# Patient Record
Sex: Male | Born: 1965
Health system: Southern US, Community
[De-identification: ages and names within clinical notes are randomized; demographics above are authoritative.]

## PROBLEM LIST (undated history)

## (undated) DIAGNOSIS — I10 Essential (primary) hypertension: Secondary | ICD-10-CM

## (undated) DIAGNOSIS — R7303 Prediabetes: Secondary | ICD-10-CM

## (undated) DIAGNOSIS — R011 Cardiac murmur, unspecified: Secondary | ICD-10-CM

## (undated) DIAGNOSIS — M1990 Unspecified osteoarthritis, unspecified site: Secondary | ICD-10-CM

## (undated) DIAGNOSIS — D649 Anemia, unspecified: Secondary | ICD-10-CM

## (undated) DIAGNOSIS — F1020 Alcohol dependence, uncomplicated: Secondary | ICD-10-CM

## (undated) DIAGNOSIS — T7840XA Allergy, unspecified, initial encounter: Secondary | ICD-10-CM

## (undated) HISTORY — DX: Allergy, unspecified, initial encounter: T78.40XA

## (undated) HISTORY — PX: NO PAST SURGERIES: SHX2092

## (undated) HISTORY — DX: Cardiac murmur, unspecified: R01.1

## (undated) HISTORY — DX: Unspecified osteoarthritis, unspecified site: M19.90

---

## 2004-11-10 ENCOUNTER — Emergency Department (HOSPITAL_COMMUNITY): Admission: EM | Admit: 2004-11-10 | Discharge: 2004-11-10 | Payer: Self-pay | Admitting: Emergency Medicine

## 2013-05-29 ENCOUNTER — Emergency Department (HOSPITAL_COMMUNITY)
Admission: EM | Admit: 2013-05-29 | Discharge: 2013-05-29 | Disposition: A | Discharge status: Home / Self Care | Payer: Worker's Compensation | Attending: Emergency Medicine | Admitting: Emergency Medicine

## 2013-05-29 ENCOUNTER — Encounter (HOSPITAL_COMMUNITY): Payer: Self-pay | Admitting: Emergency Medicine

## 2013-05-29 DIAGNOSIS — Y9389 Activity, other specified: Secondary | ICD-10-CM | POA: Insufficient documentation

## 2013-05-29 DIAGNOSIS — S8990XA Unspecified injury of unspecified lower leg, initial encounter: Secondary | ICD-10-CM | POA: Insufficient documentation

## 2013-05-29 DIAGNOSIS — M79652 Pain in left thigh: Secondary | ICD-10-CM

## 2013-05-29 DIAGNOSIS — Z79899 Other long term (current) drug therapy: Secondary | ICD-10-CM | POA: Insufficient documentation

## 2013-05-29 DIAGNOSIS — I10 Essential (primary) hypertension: Secondary | ICD-10-CM | POA: Insufficient documentation

## 2013-05-29 DIAGNOSIS — Y92410 Unspecified street and highway as the place of occurrence of the external cause: Secondary | ICD-10-CM | POA: Insufficient documentation

## 2013-05-29 DIAGNOSIS — F17200 Nicotine dependence, unspecified, uncomplicated: Secondary | ICD-10-CM | POA: Insufficient documentation

## 2013-05-29 HISTORY — DX: Essential (primary) hypertension: I10

## 2013-05-29 MED ORDER — HYDROCODONE-ACETAMINOPHEN 5-325 MG PO TABS
1.0000 | ORAL_TABLET | Freq: Once | ORAL | Status: AC
  Administered 2013-05-29: 1 via ORAL
  Filled 2013-05-29: qty 1

## 2013-05-29 MED ORDER — IBUPROFEN 200 MG PO TABS
400.0000 mg | ORAL_TABLET | Freq: Once | ORAL | Status: AC
  Administered 2013-05-29: 400 mg via ORAL
  Filled 2013-05-29: qty 2

## 2013-05-29 MED ORDER — HYDROCODONE-ACETAMINOPHEN 5-325 MG PO TABS: ORAL_TABLET | ORAL | Status: DC
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
## 2013-05-29 NOTE — ED Provider Notes (Signed)
Medical screening examination/treatment/procedure(s) were performed by non-physician practitioner and as supervising physician I was immediately available for consultation/collaboration.   Jaymin B. Bernette Mayers, MD 05/29/13 1112

## 2013-05-29 NOTE — ED Notes (Signed)
Patient was educated not to drive, operate heavy machinery, or drink alcohol while taking narcotic medication.  
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
## 2016-01-30 ENCOUNTER — Encounter (HOSPITAL_COMMUNITY): Payer: Self-pay | Admitting: Emergency Medicine

## 2016-01-30 ENCOUNTER — Emergency Department (HOSPITAL_COMMUNITY): Payer: Self-pay

## 2016-01-30 ENCOUNTER — Emergency Department (HOSPITAL_COMMUNITY)
Admission: EM | Admit: 2016-01-30 | Discharge: 2016-01-30 | Disposition: A | Discharge status: Home / Self Care | Payer: Self-pay | Attending: Emergency Medicine | Admitting: Emergency Medicine

## 2016-01-30 DIAGNOSIS — R079 Chest pain, unspecified: Secondary | ICD-10-CM | POA: Insufficient documentation

## 2016-01-30 DIAGNOSIS — Z79899 Other long term (current) drug therapy: Secondary | ICD-10-CM | POA: Insufficient documentation

## 2016-01-30 DIAGNOSIS — M79606 Pain in leg, unspecified: Secondary | ICD-10-CM | POA: Insufficient documentation

## 2016-01-30 DIAGNOSIS — I10 Essential (primary) hypertension: Secondary | ICD-10-CM | POA: Insufficient documentation

## 2016-01-30 DIAGNOSIS — F17210 Nicotine dependence, cigarettes, uncomplicated: Secondary | ICD-10-CM | POA: Insufficient documentation

## 2016-01-30 LAB — CBC
HCT: 41.2 % (ref 39.0–52.0)
HEMOGLOBIN: 14.2 g/dL (ref 13.0–17.0)
MCH: 29.7 pg (ref 26.0–34.0)
MCHC: 34.5 g/dL (ref 30.0–36.0)
MCV: 86.2 fL (ref 78.0–100.0)
Platelets: 185 10*3/uL (ref 150–400)
RBC: 4.78 MIL/uL (ref 4.22–5.81)
RDW: 14.3 % (ref 11.5–15.5)
WBC: 7.5 10*3/uL (ref 4.0–10.5)

## 2016-01-30 LAB — BASIC METABOLIC PANEL
ANION GAP: 11 (ref 5–15)
BUN: 16 mg/dL (ref 6–20)
CALCIUM: 9.4 mg/dL (ref 8.9–10.3)
CHLORIDE: 106 mmol/L (ref 101–111)
CO2: 26 mmol/L (ref 22–32)
Creatinine, Ser: 0.81 mg/dL (ref 0.61–1.24)
GFR calc Af Amer: >60 mL/min (ref >60)
GFR calc non Af Amer: >60 mL/min (ref >60)
Glucose, Bld: 96 mg/dL (ref 65–99)
Potassium: 4.3 mmol/L (ref 3.5–5.1)
Sodium: 143 mmol/L (ref 135–145)

## 2016-01-30 LAB — I-STAT TROPONIN, ED
TROPONIN I, POC: 0.01 ng/mL (ref 0.00–0.08)
Troponin i, poc: 0.01 ng/mL (ref 0.00–0.08)

## 2016-01-30 MED ORDER — HYDROCHLOROTHIAZIDE 12.5 MG PO CAPS
25.0000 mg | ORAL_CAPSULE | Freq: Once | ORAL | Status: AC
  Administered 2016-01-30: 25 mg via ORAL
  Filled 2016-01-30: qty 2

## 2016-01-30 MED ORDER — HYDROCHLOROTHIAZIDE 25 MG PO TABS: 25.0000 mg | ORAL_TABLET | Freq: Every day | ORAL | Status: DC

## 2016-01-30 NOTE — ED Notes (Signed)
Per pt, states right/left chest pain since last night-not constant-states occurred a couple of times

## 2016-01-30 NOTE — ED Notes (Signed)
Explained to pt he needs to be in bed and on the cardiac monitor, her declined and is sitting in the chair, sts "I don't want to jinx anything, if I am attached to it".

## 2016-01-30 NOTE — ED Notes (Signed)
Pt reports right side/chest pain x 3 days, he sts around same time he noticed pain in his right lower leg, radiating to hip. Right lower leg is somewhat swollen, red and warm to touch.

## 2016-01-30 NOTE — ED Notes (Signed)
Pt. Refuses to be put back on cardiac monitor.

## 2016-01-30 NOTE — Discharge Instructions (Signed)
Follow up with the heart md in 1-2 weeks

## 2016-01-31 NOTE — ED Provider Notes (Addendum)
CSN: [id number]     Arrival date & time 01/30/16  1131 History   First MD Initiated Contact with Patient 01/30/16 1310     Chief Complaint  Patient presents with  . Chest Pain  . Leg Pain     (Consider location/radiation/quality/duration/timing/severity/associated sxs/prior Treatment) Patient is a 50 y.o. male presenting with chest pain and leg pain. The history is provided by the patient (Patient complains of right-sided chest pain for a few seconds this has occurred a few times. He used to be on blood pressure medicine but has not been taking it for quite some time).  Chest Pain Pain location:  L chest Pain quality: aching   Pain radiates to:  Does not radiate Pain radiates to the back: no   Pain severity:  Moderate Onset quality:  Sudden Timing:  Intermittent Progression:  Resolved Chronicity:  New Associated symptoms: no abdominal pain, no back pain, no cough, no fatigue and no headache   Leg Pain Associated symptoms: no back pain and no fatigue     Past Medical History  Diagnosis Date  . Hypertension    History reviewed. No pertinent past surgical history. No family history on file. Social History  Substance Use Topics  . Smoking status: Current Every Day Smoker -- 0.50 packs/day    Types: Cigarettes  . Smokeless tobacco: None  . Alcohol Use: Yes     Comment: occasional    Review of Systems  Constitutional: Negative for appetite change and fatigue.  HENT: Negative for congestion, ear discharge and sinus pressure.   Eyes: Negative for discharge.  Respiratory: Negative for cough.   Cardiovascular: Positive for chest pain.  Gastrointestinal: Negative for abdominal pain and diarrhea.  Genitourinary: Negative for frequency and hematuria.  Musculoskeletal: Negative for back pain.  Skin: Negative for rash.  Neurological: Negative for seizures and headaches.  Psychiatric/Behavioral: Negative for hallucinations.      Allergies  Review of patient's allergies  indicates no known allergies.  Home Medications   Prior to Admission medications   Medication Sig Start Date End Date Taking? Authorizing Provider  ibuprofen (ADVIL,MOTRIN) 200 MG tablet Take 400 mg by mouth every 6 (six) hours as needed for fever, headache, mild pain, moderate pain or cramping.   Yes Historical Provider, MD  Multiple Vitamin (MULTIVITAMIN WITH MINERALS) TABS tablet Take 1 tablet by mouth daily.   Yes Historical Provider, MD  hydrochlorothiazide (HYDRODIURIL) 25 MG tablet Take 1 tablet (25 mg total) by mouth daily. 01/30/16   Bethann Berkshire, MD   BP 161/105 mmHg  Pulse 74  Temp(Src) 97.9 F (36.6 C) (Oral)  Resp 14  Ht  (1.88 m)  Wt 202 lb (91.627 kg)  BMI 25.92 kg/m2  SpO2 100% Physical Exam  Constitutional: He is oriented to person, place, and time. He appears well-developed.  HENT:  Head: Normocephalic.  Eyes: Conjunctivae and EOM are normal. No scleral icterus.  Neck: Neck supple. No thyromegaly present.  Cardiovascular: Normal rate and regular rhythm.  Exam reveals no gallop and no friction rub.   No murmur heard. Pulmonary/Chest: No stridor. He has no wheezes. He has no rales. He exhibits no tenderness.  Abdominal: He exhibits no distension. There is no tenderness. There is no rebound.  Musculoskeletal: Normal range of motion. He exhibits no edema.  Lymphadenopathy:    He has no cervical adenopathy.  Neurological: He is oriented to person, place, and time. He exhibits normal muscle tone. Coordination normal.  Skin: No rash noted. No erythema.  Psychiatric: He has a normal mood and affect. His behavior is normal.    ED Course  Procedures (including critical care time) Labs Review Labs Reviewed  BASIC METABOLIC PANEL  CBC  I-STAT TROPOININ, ED  Rosezena SensorI-STAT TROPOININ, ED  Rosezena SensorI-STAT TROPOININ, ED    Imaging Review Dg Chest 2 View  01/30/2016  CLINICAL DATA:  Right chest pain for the past 2-3 days.  Smoker. EXAM: CHEST  2 VIEW COMPARISON:  None.  FINDINGS: Normal sized heart. Clear lungs. Flattening of the hemidiaphragms. Minimal scoliosis. IMPRESSION: No acute abnormality.  Mild changes of COPD. Electronically Signed   By: Beckie SaltsSteven  Reid M.D.   On: 01/30/2016 12:39   I have personally reviewed and evaluated these images and lab results as part of my medical decision-making.   EKG Interpretation   Date/Time:  Saturday January 30 2016 11:38:59 EDT Ventricular Rate:  98 PR Interval:  153 QRS Duration: 107 QT Interval:  335 QTC Calculation: 428 R Axis:   46 Text Interpretation:  Sinus rhythm Biatrial enlargement Probable  anteroseptal infarct, old Abnrm T, consider ischemia, anterolateral lds No  old tracing to compare Confirmed by [facility]  MD, MARTHA [phone]([id number]) on  01/30/2016 12:07:43 PM Also confirmed by Jeremih Dearmas  MD, Jomarie LongsJOSEPH [phone]([id number]  on  01/30/2016 1:12:07 PM      MDM   Final diagnoses:  Chest pain at rest   Patient with chest pain and 2 normal troponins. EKG shows inverted T waves.  Patient is put on blood pressure medicine and had been referred to cardiology    Bethann BerkshireJoseph Braileigh Landenberger, MD 01/31/16 [phone]  Bethann BerkshireJoseph Terree Gaultney, MD 04/25/16 2129

## 2016-02-04 ENCOUNTER — Encounter (HOSPITAL_COMMUNITY): Payer: Self-pay | Admitting: Emergency Medicine

## 2016-02-04 ENCOUNTER — Emergency Department (HOSPITAL_COMMUNITY)
Admission: EM | Admit: 2016-02-04 | Discharge: 2016-02-04 | Disposition: A | Discharge status: Home / Self Care | Payer: Self-pay | Attending: Emergency Medicine | Admitting: Emergency Medicine

## 2016-02-04 ENCOUNTER — Ambulatory Visit (HOSPITAL_BASED_OUTPATIENT_CLINIC_OR_DEPARTMENT_OTHER): Payer: Self-pay

## 2016-02-04 DIAGNOSIS — F17210 Nicotine dependence, cigarettes, uncomplicated: Secondary | ICD-10-CM | POA: Insufficient documentation

## 2016-02-04 DIAGNOSIS — M7989 Other specified soft tissue disorders: Secondary | ICD-10-CM

## 2016-02-04 DIAGNOSIS — M79609 Pain in unspecified limb: Secondary | ICD-10-CM

## 2016-02-04 DIAGNOSIS — I10 Essential (primary) hypertension: Secondary | ICD-10-CM | POA: Insufficient documentation

## 2016-02-04 DIAGNOSIS — L03115 Cellulitis of right lower limb: Secondary | ICD-10-CM | POA: Insufficient documentation

## 2016-02-04 DIAGNOSIS — Z79899 Other long term (current) drug therapy: Secondary | ICD-10-CM | POA: Insufficient documentation

## 2016-02-04 LAB — D-DIMER, QUANTITATIVE (NOT AT ARMC): D DIMER QUANT: 0.85 ug{FEU}/mL — AB (ref 0.00–0.50)

## 2016-02-04 MED ORDER — SULFAMETHOXAZOLE-TRIMETHOPRIM 800-160 MG PO TABS: 1.0000 | ORAL_TABLET | Freq: Two times a day (BID) | ORAL | Status: AC

## 2016-02-04 MED ORDER — SULFAMETHOXAZOLE-TRIMETHOPRIM 800-160 MG PO TABS
1.0000 | ORAL_TABLET | Freq: Once | ORAL | Status: AC
  Administered 2016-02-04: 1 via ORAL
  Filled 2016-02-04: qty 1

## 2016-02-04 NOTE — ED Notes (Signed)
Patient states R leg swelling and pain.  Patient states it has been swollen and hurting x 1 week.  Patient states was seen for the same last week "but they put me on bp medication".   Patient denies other problems at this time.

## 2016-02-04 NOTE — Discharge Instructions (Signed)
Cellulitis °Cellulitis is an infection of the skin and the tissue under the skin. The infected area is usually red and tender. This happens most often in the arms and lower legs. °HOME CARE  °· Take your antibiotic medicine as told. Finish the medicine even if you start to feel better. °· Keep the infected arm or leg raised (elevated). °· Put a warm cloth on the area up to 4 times per day. °· Only take medicines as told by your doctor. °· Keep all doctor visits as told. °GET HELP IF: °· You see red streaks on the skin coming from the infected area. °· Your red area gets bigger or turns a dark color. °· Your bone or joint under the infected area is painful after the skin heals. °· Your infection comes back in the same area or different area. °· You have a puffy (swollen) bump in the infected area. °· You have new symptoms. °· You have a fever. °GET HELP RIGHT AWAY IF:  °· You feel very sleepy. °· You throw up (vomit) or have watery poop (diarrhea). °· You feel sick and have muscle aches and pains. °  °This information is not intended to replace advice given to you by your health care provider. Make sure you discuss any questions you have with your health care provider. °  °Document Released: 03/21/2008 Document Revised: 06/24/2015 Document Reviewed: 12/19/2011 °Elsevier Interactive Patient Education ©2016 [employer] ° °

## 2016-02-04 NOTE — ED Provider Notes (Signed)
CSN: [id number]     Arrival date & time 02/04/16  0821 History   First MD Initiated Contact with Patient 02/04/16 [phone]     Chief Complaint  Patient presents with  . leg swellling    Patient gave verbal permission to utilize photo for medical documentation only The image was not stored on any personal device  Patient is a 50 y.o. male presenting with leg pain. The history is provided by the patient.  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 Hypertension    History reviewed. No pertinent past surgical history. No family history on file. Social History  Substance Use Topics  . Smoking status: Current Every Day Smoker -- 0.50 packs/day    Types: Cigarettes  . Smokeless tobacco: None  . Alcohol Use: Yes     Comment: occasional    Review of Systems  Constitutional: Negative for fever.  Cardiovascular: Negative for chest pain.  Gastrointestinal: Negative for vomiting and blood in stool.  Skin: Positive for color change.  All other systems reviewed and are negative.     Allergies  Review of patient's allergies indicates no known allergies.  Home Medications   Prior to Admission medications   Medication Sig Start Date End Date Taking? Authorizing Provider  hydrochlorothiazide (HYDRODIURIL) 25 MG tablet Take 1 tablet (25 mg total) by mouth daily. 01/30/16  Yes Bethann Berkshire, MD  ibuprofen (ADVIL,MOTRIN) 200 MG tablet Take 400 mg by mouth every 6 (six) hours as needed for fever, headache, mild pain, moderate pain or cramping.    Historical Provider, MD  Multiple Vitamin (MULTIVITAMIN WITH MINERALS) TABS tablet Take 1 tablet by mouth daily.    Historical Provider, MD   BP 163/88 mmHg  Pulse 98  Temp(Src) 98.3 F (36.8 C) (Oral)  Resp 20  Ht  (1.88 m)  Wt 93.895 kg  BMI 26.57 kg/m2  SpO2 96% Physical Exam CONSTITUTIONAL: Well developed/well nourished  HEAD: Normocephalic/atraumatic EYES: EOMI ENMT: Mucous membranes moist NECK: supple no meningeal signs SPINE/BACK:entire spine nontender CV: S1/S2 noted, no murmurs/rubs/gallops noted LUNGS: Lungs are clear to auscultation bilaterally, no apparent distress ABDOMEN: soft, nontender, no rebound or guarding, bowel sounds noted throughout abdomen GU:no cva tenderness NEURO: Pt is awake/alert/appropriate, moves all extremitiesx4.  No facial droop.   EXTREMITIES: pulses normal/equal, full ROM, no crepitus, minimal edema to right LE, no calf tenderness, see photo, he has erythema and tenderness but no abscess noted SKIN: warm, color normal PSYCH: no abnormalities of mood noted, alert and oriented to situation     ED Course  Procedures  Pt with elevated d-dimer Will give DVT study If negative, likely cellulitis without abscess 12:08 PM Negative DVT study Will tx for early cellulitis Return precautions discussed Stable for d/c home  Labs  Review Labs Reviewed  D-DIMER, QUANTITATIVE (NOT AT [facility]) - Abnormal; Notable for the following:    D-Dimer, Quant 0.85 (*)    All other components within normal limits   I have personally reviewed and evaluated these  lab results as part of my medical decision-making.   Medications  sulfamethoxazole-trimethoprim (BACTRIM DS,SEPTRA DS) 800-160 MG per tablet 1 tablet (1 tablet Oral Given 02/04/16 0951)    MDM   Final diagnoses:  Cellulitis of right  lower extremity   Nursing notes including past medical history and social history reviewed and considered in documentation Labs/vital reviewed myself and considered during evaluation     Zadie Rhineonald Zaida Reiland, MD 02/04/16 1208

## 2016-02-04 NOTE — Progress Notes (Signed)
Preliminary results by tech - Left Lower Ext. Venous Duplex Completed. Negative for deep and superficial vein thrombosis.  Sophea Rackham, BS, RDMS, RVT  

## 2016-05-27 ENCOUNTER — Encounter (HOSPITAL_COMMUNITY): Payer: Self-pay | Admitting: Emergency Medicine

## 2016-05-27 ENCOUNTER — Emergency Department (HOSPITAL_COMMUNITY)
Admission: EM | Admit: 2016-05-27 | Discharge: 2016-05-27 | Disposition: A | Discharge status: Home / Self Care | Payer: Self-pay | Attending: Emergency Medicine | Admitting: Emergency Medicine

## 2016-05-27 DIAGNOSIS — F17210 Nicotine dependence, cigarettes, uncomplicated: Secondary | ICD-10-CM | POA: Insufficient documentation

## 2016-05-27 DIAGNOSIS — R55 Syncope and collapse: Secondary | ICD-10-CM | POA: Insufficient documentation

## 2016-05-27 DIAGNOSIS — H538 Other visual disturbances: Secondary | ICD-10-CM | POA: Insufficient documentation

## 2016-05-27 DIAGNOSIS — I10 Essential (primary) hypertension: Secondary | ICD-10-CM | POA: Insufficient documentation

## 2016-05-27 DIAGNOSIS — E860 Dehydration: Secondary | ICD-10-CM | POA: Insufficient documentation

## 2016-05-27 LAB — CBG MONITORING, ED: Glucose-Capillary: 82 mg/dL (ref 65–99)

## 2016-05-27 LAB — BASIC METABOLIC PANEL
Anion gap: 13 (ref 5–15)
BUN: 9 mg/dL (ref 6–20)
CALCIUM: 8.7 mg/dL — AB (ref 8.9–10.3)
CHLORIDE: 106 mmol/L (ref 101–111)
CO2: 21 mmol/L — ABNORMAL LOW (ref 22–32)
CREATININE: 0.78 mg/dL (ref 0.61–1.24)
Glucose, Bld: 79 mg/dL (ref 65–99)
Potassium: 4 mmol/L (ref 3.5–5.1)
SODIUM: 140 mmol/L (ref 135–145)

## 2016-05-27 LAB — CBC
HCT: 43.1 % (ref 39.0–52.0)
Hemoglobin: 14.5 g/dL (ref 13.0–17.0)
MCH: 30.6 pg (ref 26.0–34.0)
MCHC: 33.6 g/dL (ref 30.0–36.0)
MCV: 90.9 fL (ref 78.0–100.0)
PLATELETS: 194 10*3/uL (ref 150–400)
RBC: 4.74 MIL/uL (ref 4.22–5.81)
RDW: 13.3 % (ref 11.5–15.5)
WBC: 5.2 10*3/uL (ref 4.0–10.5)

## 2016-05-27 LAB — I-STAT TROPONIN, ED: Troponin i, poc: 0.01 ng/mL (ref 0.00–0.08)

## 2016-05-27 MED ORDER — HYDROCHLOROTHIAZIDE 25 MG PO TABS: 25.0000 mg | ORAL_TABLET | Freq: Every day | ORAL | 1 refills | Status: DC

## 2016-05-27 MED ORDER — SODIUM CHLORIDE 0.9 % IV BOLUS (SEPSIS)
1000.0000 mL | Freq: Once | INTRAVENOUS | Status: AC
  Administered 2016-05-27: 1000 mL via INTRAVENOUS

## 2016-05-27 NOTE — ED Notes (Signed)
Dr. Orpah ClintonIssac aware of pt's BP.

## 2016-05-27 NOTE — ED Notes (Signed)
While ambulating pt in the hallway, pt appeared steady on his feet. Pt asked multiple times how he felt and stated that he "felt fine". Informed Cassie - RN.

## 2016-05-27 NOTE — ED Provider Notes (Signed)
[facility]-EMERGENCY DEPT Provider Note   CSN: [id number] Arrival date & time: 05/27/16  1103  First Provider Contact:  First MD Initiated Contact with Patient 05/27/16 1145        History   Chief Complaint Chief Complaint  Patient presents with  . Dizziness  . Blurred Vision  . Nausea    HPI Cameron Rogers is a 50 y.o. male.  HPI 50 year old male with past mental history of poorly controlled hypertension who presents with mild dizziness, blurred vision and near syncope while working outside today. The patient states that he has been under significantly increased stress over the last several days. He has not been sleeping well. He woke up late this morning and did not eat breakfast. 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 While working. He began to develop mild lightheadedness and dizziness. He describes transient bilateral blurring of his vision and a sensation that he was going to pass out. His symptoms and resolved when he rested, but recurred after he began working again. Denies any social chest pain or palpitations. He presented to his work advising advised to present to the ED. Of note, he has not been taking his blood pressure medications as he ran out of them. Denies any headache. Denies any vision changes  Past Medical History:  Diagnosis Date  . Hypertension     There are no active problems to display for this patient.   History reviewed. No pertinent surgical history.     Home Medications    Prior to Admission medications   Medication Sig Start Date End Date Taking? Authorizing Provider  hydrochlorothiazide (HYDRODIURIL) 25 MG tablet Take 1 tablet (25 mg total) by mouth daily. 05/27/16   Shaune Pollack, MD    Family History No family history on file.  Social History Social History  Substance Use Topics  . Smoking status: Current Every Day Smoker    Packs/day: 0.50    Types: Cigarettes  . Smokeless tobacco: Never Used  . Alcohol use Yes   Comment: occasional     Allergies   Review of patient's allergies indicates no known allergies.   Review of Systems Review of Systems  Constitutional: Positive for fatigue. Negative for chills and fever.  HENT: Negative for congestion and rhinorrhea.   Eyes: Negative for visual disturbance.  Respiratory: Negative for cough, shortness of breath and wheezing.   Cardiovascular: Negative for chest pain and leg swelling.  Gastrointestinal: Negative for abdominal pain, diarrhea, nausea and vomiting.  Genitourinary: Negative for dysuria and flank pain.  Musculoskeletal: Negative for neck pain and neck stiffness.  Skin: Negative for rash and wound.  Allergic/Immunologic: Negative for immunocompromised state.  Neurological: Positive for dizziness and light-headedness. Negative for syncope, weakness and headaches.     Physical Exam Updated Vital Signs BP (!) 158/110 (BP Location: Right Arm)   Pulse 78   Temp 97.9 F (36.6 C) (Oral)   Resp 18   Ht 6\' 2"  (1.88 m)   Wt 185 lb (83.9 kg)   SpO2 98%   BMI 23.75 kg/m   Physical Exam  Constitutional: He is oriented to person, place, and time. He appears well-developed and well-nourished. No distress.  HENT:  Head: Normocephalic and atraumatic.  Dry mucous membranes  Eyes: Conjunctivae are normal. Pupils are equal, round, and reactive to light.  Neck: Neck supple.  Cardiovascular: Normal rate, regular rhythm and normal heart sounds.  Exam reveals no friction rub.   No murmur heard. Pulmonary/Chest: Effort normal  and breath sounds normal. No respiratory distress. He has no wheezes. He has no rales.  Abdominal: He exhibits no distension. There is no tenderness.  Musculoskeletal: He exhibits no edema.  Neurological: He is alert and oriented to person, place, and time. He has normal strength. He displays normal reflexes. No cranial nerve deficit or sensory deficit. He exhibits normal muscle tone. Coordination and gait normal. GCS eye  subscore is 4. GCS verbal subscore is 5. GCS motor subscore is 6.  Skin: Skin is warm. Capillary refill takes 2 to 3 seconds.  Nursing note and vitals reviewed.    ED Treatments / Results  Labs (all labs ordered are listed, but only abnormal results are displayed) Labs Reviewed  BASIC METABOLIC PANEL - Abnormal; Notable for the following:       Result Value   CO2 21 (*)    Calcium 8.7 (*)    All other components within normal limits  CBC  CBG MONITORING, ED  I-STAT TROPOININ, ED    EKG  EKG Interpretation  Date/Time:  Friday May 27 2016 11:15:15 EDT Ventricular Rate:  81 PR Interval:    QRS Duration: 110 QT Interval:  392 QTC Calculation: 455 R Axis:   36 Text Interpretation:  Sinus rhythm Left atrial enlargement Abnormal T, consider ischemia, lateral leads When compared to 01/30/16 TWI are improved and resolving in V4 No ST depressions compared to prior Confirmed by Annika Selke MD, Sheria LangAMERON [phone] on 05/27/2016 12:42:35 PM Also confirmed by Erma HeritageISAACS MD, Nabor Thomann [phone]), editor Whitney PostLOGAN, Cala BradfordKIMBERLY [phone])  on 05/27/2016 1:10:04 PM       Radiology No results found.  Procedures Procedures (including critical care time)  Medications Ordered in ED Medications  sodium chloride 0.9 % bolus 1,000 mL (0 mLs Intravenous Stopped 05/27/16 1303)     Initial Impression / Assessment and Plan / ED Course  I have reviewed the triage vital signs and the nursing notes.  Pertinent labs & imaging results that were available during my care of the patient were reviewed by me and considered in my medical decision making (see chart for details).  Clinical Course    50 yo AAM with PMHx of chronic, uncontrolled HTN who p/w pre-syncopal episode while working in the heat today. Pt admits to being dehydrated. On arrival, VSS and WNL for pt. He is hypertensive but this is baseline for him. No focal neurological deficits. EKG is non-ischemic - he has chronic TWI consistent with likely LVH and repol but  EKG Is improved from prior. Regarding his near syncope event, primary suspicion is combination of dehydration and orthostasis. Pt had very typical prodrome in setting of work in heat, which resolved with rest. Pt also had poor PO intake this AM and did not eat breakfast. This is c/w orthostasis and dehydration. No signs of ACS on EKG, intervals are normal, and based on history I have a low suspicion for ACS or cardiac arrhythmia. As mentioned, he has no focal neuro deficits to suggest CVA, TIA, seizure, or neurogenic etiology. His screening labwork is overall unremarkable with exception of mildly decreased CO2, c/w dehydration. IVF have been given. Pt is ambulatory throughout ED without difficulty and feels better with IVF and reassurance. He remains hypertensive but this is baseline and pt has no HA, vision changes, CP, SOB, or renal injury to suggest HTN urgency or emergency. Will refill his home BP meds, refer to PCP, and advsie fluids at home. Pt in agreement. Return precautions given.   Final Clinical Impressions(s) /  ED Diagnoses   Final diagnoses:  Essential hypertension  Pre-syncope  Dehydration    New Prescriptions Discharge Medication List as of 05/27/2016 12:47 PM       Shaune Pollack, MD 05/28/16 1152

## 2016-05-27 NOTE — ED Notes (Signed)
Pt's CBG 82.  Informed Cassie, RN.

## 2016-05-27 NOTE — ED Triage Notes (Signed)
Received pt from work with c/o 3 episodes of dizziness. Pt reports blurred vision and nausea. Symptoms onset about 0945. Symptoms resolved PTA except for nausea. Pt BP initially was 180/130 for EMS, pt ran out of his BP meds. Pt given 4 mg of zofran. BP decreased to 156/94 while with EMS.

## 2016-06-02 ENCOUNTER — Encounter: Payer: Self-pay | Admitting: Physician Assistant

## 2016-06-02 ENCOUNTER — Ambulatory Visit: Payer: Self-pay | Attending: Internal Medicine | Admitting: Physician Assistant

## 2016-06-02 VITALS — BP 152/92 | HR 96 | Temp 98.5°F | Wt 178.2 lb

## 2016-06-02 DIAGNOSIS — R634 Abnormal weight loss: Secondary | ICD-10-CM | POA: Insufficient documentation

## 2016-06-02 DIAGNOSIS — Z720 Tobacco use: Secondary | ICD-10-CM

## 2016-06-02 DIAGNOSIS — F17200 Nicotine dependence, unspecified, uncomplicated: Secondary | ICD-10-CM

## 2016-06-02 DIAGNOSIS — I10 Essential (primary) hypertension: Secondary | ICD-10-CM | POA: Insufficient documentation

## 2016-06-02 DIAGNOSIS — F17210 Nicotine dependence, cigarettes, uncomplicated: Secondary | ICD-10-CM | POA: Insufficient documentation

## 2016-06-02 LAB — TSH: TSH: 0.77 mIU/L (ref 0.40–4.50)

## 2016-06-02 MED ORDER — HYDROCHLOROTHIAZIDE 25 MG PO TABS: 25.0000 mg | ORAL_TABLET | Freq: Every day | ORAL | 1 refills | Status: DC

## 2016-06-02 NOTE — Patient Instructions (Signed)
Check Blood pressure out of the office 2-3 times/week if possible and record  1-800-quitnow for smoking cessation resources   Hypertension Hypertension, commonly called high blood pressure, is when the force of blood pumping through your arteries is too strong. Your arteries are the blood vessels that carry blood from your heart throughout your body. A blood pressure reading consists of a higher number over a lower number, such as 110/72. The higher number (systolic) is the pressure inside your arteries when your heart pumps. The lower number (diastolic) is the pressure inside your arteries when your heart relaxes. Ideally you want your blood pressure below 120/80. Hypertension forces your heart to work harder to pump blood. Your arteries may become narrow or stiff. Having untreated or uncontrolled hypertension can cause heart attack, stroke, kidney disease, and other problems. RISK FACTORS Some risk factors for high blood pressure are controllable. Others are not.  Risk factors you cannot control include:   Race. You may be at higher risk if you are African American.  Age. Risk increases with age.  Gender. Men are at higher risk than women before age 50 years. After age 50, women are at higher risk than men. Risk factors you can control include:  Not getting enough exercise or physical activity.  Being overweight.  Getting too much fat, sugar, calories, or salt in your diet.  Drinking too much alcohol. SIGNS AND SYMPTOMS Hypertension does not usually cause signs or symptoms. Extremely high blood pressure (hypertensive crisis) may cause headache, anxiety, shortness of breath, and nosebleed. DIAGNOSIS To check if you have hypertension, your health care provider will measure your blood pressure while you are seated, with your arm held at the level of your heart. It should be measured at least twice using the same arm. Certain conditions can cause a difference in blood pressure between your  right and left arms. A blood pressure reading that is higher than normal on one occasion does not mean that you need treatment. If it is not clear whether you have high blood pressure, you may be asked to return on a different day to have your blood pressure checked again. Or, you may be asked to monitor your blood pressure at home for 1 or more weeks. TREATMENT Treating high blood pressure includes making lifestyle changes and possibly taking medicine. Living a healthy lifestyle can help lower high blood pressure. You may need to change some of your habits. Lifestyle changes may include:  Following the DASH diet. This diet is high in fruits, vegetables, and whole grains. It is low in salt, red meat, and added sugars.  Keep your sodium intake below 2,300 mg per day.  Getting at least 30-45 minutes of aerobic exercise at least 4 times per week.  Losing weight if necessary.  Not smoking.  Limiting alcoholic beverages.  Learning ways to reduce stress. Your health care provider may prescribe medicine if lifestyle changes are not enough to get your blood pressure under control, and if one of the following is true:  You are 6318-859 years of age and your systolic blood pressure is above 140.  You are 50 years of age or older, and your systolic blood pressure is above 150.  Your diastolic blood pressure is above 90.  You have diabetes, and your systolic blood pressure is over 140 or your diastolic blood pressure is over 90.  You have kidney disease and your blood pressure is above 140/90.  You have heart disease and your blood pressure is above  140/90. Your personal target blood pressure may vary depending on your medical conditions, your age, and other factors. HOME CARE INSTRUCTIONS  Have your blood pressure rechecked as directed by your health care provider.   Take medicines only as directed by your health care provider. Follow the directions carefully. Blood pressure medicines must be  taken as prescribed. The medicine does not work as well when you skip doses. Skipping doses also puts you at risk for problems.  Do not smoke.   Monitor your blood pressure at home as directed by your health care provider. SEEK MEDICAL CARE IF:   You think you are having a reaction to medicines taken.  You have recurrent headaches or feel dizzy.  You have swelling in your ankles.  You have trouble with your vision. SEEK IMMEDIATE MEDICAL CARE IF:  You develop a severe headache or confusion.  You have unusual weakness, numbness, or feel faint.  You have severe chest or abdominal pain.  You vomit repeatedly.  You have trouble breathing. MAKE SURE YOU:   Understand these instructions.  Will watch your condition.  Will get help right away if you are not doing well or get worse.   This information is not intended to replace advice given to you by your health care provider. Make sure you discuss any questions you have with your health care provider.   Document Released: 10/03/2005 Document Revised: 02/17/2015 Document Reviewed: 07/26/2013 Elsevier Interactive Patient Education [employer]2016 Elsevier Inc.

## 2016-06-02 NOTE — Progress Notes (Signed)
Pt states he is here for the following:   Emergency Room following up for hypertension Medication refill  Pt states he feels like blood pressure medication is not helping Pt would like to discuss smoking cessation Pt would like to make sure he is not diabetic

## 2016-06-02 NOTE — Progress Notes (Signed)
Patient ID: Cameron Rogers, male   DOB: 05/24/1966, 50 y.o.   MRN: [id number]   Cameron Rogers, is a 50 y.o. male  NWG:[id number]  [id number]  DOB - 04/04/1966  Subjective:  Chief Complaint and HPI: Cameron Rogers is a 50 y.o. male here today to establish care and for a follow up visit after being seen in the ED for dizziness and dehydration.  Cardiac work-up was negative. He has had intermittently treated htn without consistent meds or follow-up for the last few years.  He denies CP.  He has had no further dizziness or weakness since being seen in the ED.  Overall, he feels good.  He does notice that he has lost weight over the last few months.  Upon reviewing his chart, I notice that he has lost 29 pounds from 01/2016 until now.  He says his appetite has not changed.  He denies dysphagia.  No melena or hematochezia.  No abdominal pain or cough.  He has a 36 pack year history of smoking.   He was restarted on HCTZ in the ED.   ED notes, EKG, and labs reviewed.     ROS:   Constitutional:  No f/c, No night sweats, +weight loss. EENT:  No vision changes, No blurry vision, No hearing changes. No mouth, throat, or ear problems.  Respiratory: No cough, No SOB Cardiac: No CP, no palpitations GI:  No abd pain, No N/V/D. GU: No Urinary s/sx Musculoskeletal: No joint pain Neuro: No headache, no dizziness, no motor weakness.  Skin: No rash Endocrine:  No polydipsia. No polyuria.  Psych: Denies SI/HI  No problems updated.  ALLERGIES: No Known Allergies  PAST MEDICAL HISTORY: Past Medical History:  Diagnosis Date  . Hypertension     MEDICATIONS AT HOME: Prior to Admission medications   Medication Sig Start Date End Date Taking? Authorizing Provider  hydrochlorothiazide (HYDRODIURIL) 25 MG tablet Take 1 tablet (25 mg total) by mouth daily. 06/02/16  Yes Anders SimmondsAngela M McClung, PA-C     Objective:  EXAM:   Vitals:   06/02/16 1420  BP: (!) 152/92  Pulse: 96  Temp: 98.5 F (36.9 C)    TempSrc: Oral  Weight: 178 lb 3.2 oz (80.8 kg)    General appearance : A&OX3. NAD. Non-toxic-appearing HEENT: Atraumatic and Normocephalic.  PERRLA. EOM intact.  TM clear B. Mouth-MMM, post pharynx WNL w/o erythema, No PND. Neck: supple, no JVD. No cervical lymphadenopathy. No thyromegaly Chest/Lungs:  Breathing-non-labored, Good air entry bilaterally, breath sounds normal without rales, rhonchi, or wheezing  CVS: S1 S2 regular, no murmurs, gallops, rubs  Abdomen: Bowel sounds present, Non tender and not distended with no gaurding, rigidity or rebound. Extremities: Bilateral Lower Ext shows no edema, both legs are warm to touch with = pulse throughout Neurology:  CN II-XII grossly intact, Non focal.   Psych:  TP linear. J/I WNL. Normal speech. Appropriate eye contact and affect.  Skin:  No Rash  Data Review No results found for: HGBA1C  Normal glucose on BMP   Assessment & Plan   1. Essential hypertension - hydrochlorothiazide (HYDRODIURIL) 25 MG tablet; Take 1 tablet (25 mg total) by mouth daily.  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  He is going to check and see if there are inexpensive home meters he might be able to purchase. If possible, he will check his BP 2-3X/week and record it bt now and  his next visit.   Low sodium diet recommended.   2. Loss of weight Normal CBC and BMP - TSH Consider further work/up if warranted.   3. Smoker Gave smoking cessation resources.   Patient have been counseled extensively about nutrition and exercise  Return in about 3 weeks (around 06/23/2016) for establish with PCP.  The patient was given clear instructions to go to ER or return to medical center if symptoms don't improve, worsen or new problems develop. The patient verbalized understanding. The patient was told to call to get lab results if they haven't heard anything in the next week.     Georgian CoAngela McClung,  PA-C [facility] [place], [place] [phone]   06/02/2016, 4:04 PM

## 2016-06-08 ENCOUNTER — Telehealth: Payer: Self-pay

## 2016-06-08 NOTE — Telephone Encounter (Signed)
Contacted pt to go over lab results lvm for pt to give me a call at his earliest convenience

## 2016-06-09 ENCOUNTER — Telehealth: Payer: Self-pay

## 2016-06-09 NOTE — Telephone Encounter (Signed)
Contacted pt to go over lab results pt did not answer lvm for pt to give me a call back at his earliest conveience I will also be sending letter out today

## 2017-04-25 ENCOUNTER — Encounter (HOSPITAL_COMMUNITY): Payer: Self-pay

## 2017-04-25 ENCOUNTER — Emergency Department (HOSPITAL_COMMUNITY)
Admission: EM | Admit: 2017-04-25 | Discharge: 2017-04-25 | Disposition: A | Discharge status: Home / Self Care | Payer: Self-pay | Attending: Emergency Medicine | Admitting: Emergency Medicine

## 2017-04-25 DIAGNOSIS — F17210 Nicotine dependence, cigarettes, uncomplicated: Secondary | ICD-10-CM | POA: Insufficient documentation

## 2017-04-25 DIAGNOSIS — R109 Unspecified abdominal pain: Secondary | ICD-10-CM | POA: Insufficient documentation

## 2017-04-25 DIAGNOSIS — I10 Essential (primary) hypertension: Secondary | ICD-10-CM | POA: Insufficient documentation

## 2017-04-25 LAB — URINALYSIS, ROUTINE W REFLEX MICROSCOPIC
Bilirubin Urine: NEGATIVE
GLUCOSE, UA: NEGATIVE mg/dL
Ketones, ur: NEGATIVE mg/dL
Leukocytes, UA: NEGATIVE
Nitrite: NEGATIVE
PH: 5 (ref 5.0–8.0)
Protein, ur: NEGATIVE mg/dL
SPECIFIC GRAVITY, URINE: 1.006 (ref 1.005–1.030)

## 2017-04-25 LAB — COMPREHENSIVE METABOLIC PANEL
ALK PHOS: 68 U/L (ref 38–126)
ALT: 47 U/L (ref 17–63)
ANION GAP: 10 (ref 5–15)
AST: 100 U/L — ABNORMAL HIGH (ref 15–41)
Albumin: 4.2 g/dL (ref 3.5–5.0)
BILIRUBIN TOTAL: 0.9 mg/dL (ref 0.3–1.2)
BUN: 10 mg/dL (ref 6–20)
CALCIUM: 9.3 mg/dL (ref 8.9–10.3)
CO2: 26 mmol/L (ref 22–32)
Chloride: 100 mmol/L — ABNORMAL LOW (ref 101–111)
Creatinine, Ser: 0.82 mg/dL (ref 0.61–1.24)
GFR calc Af Amer: >60 mL/min (ref >60)
GLUCOSE: 83 mg/dL (ref 65–99)
Potassium: 4 mmol/L (ref 3.5–5.1)
Sodium: 136 mmol/L (ref 135–145)
TOTAL PROTEIN: 7.4 g/dL (ref 6.5–8.1)

## 2017-04-25 LAB — CBG MONITORING, ED: Glucose-Capillary: 78 mg/dL (ref 65–99)

## 2017-04-25 LAB — CBC
HCT: 42.5 % (ref 39.0–52.0)
Hemoglobin: 15.1 g/dL (ref 13.0–17.0)
MCH: 31.3 pg (ref 26.0–34.0)
MCHC: 35.5 g/dL (ref 30.0–36.0)
MCV: 88.2 fL (ref 78.0–100.0)
Platelets: 180 10*3/uL (ref 150–400)
RBC: 4.82 MIL/uL (ref 4.22–5.81)
RDW: 13.9 % (ref 11.5–15.5)
WBC: 7.1 10*3/uL (ref 4.0–10.5)

## 2017-04-25 LAB — LIPASE, BLOOD: Lipase: 21 U/L (ref 11–51)

## 2017-04-25 MED ORDER — OMEPRAZOLE 20 MG PO CPDR: 20.0000 mg | DELAYED_RELEASE_CAPSULE | Freq: Every day | ORAL | 0 refills | Status: DC

## 2017-04-25 MED ORDER — PROMETHAZINE HCL 25 MG PO TABS: 25.0000 mg | ORAL_TABLET | Freq: Three times a day (TID) | ORAL | 0 refills | Status: DC | PRN

## 2017-04-25 MED ORDER — ONDANSETRON 4 MG PO TBDP
4.0000 mg | ORAL_TABLET | Freq: Once | ORAL | Status: AC
  Administered 2017-04-25: 4 mg via ORAL
  Filled 2017-04-25: qty 1

## 2017-04-25 NOTE — Discharge Instructions (Signed)
Follow-up with your primary care doctor for further management of your high blood pressure.
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
## 2017-04-25 NOTE — ED Notes (Signed)
Gave Pt x2 cups of water, Pt tolerating water well.

## 2017-04-25 NOTE — ED Notes (Signed)
Pt is aware a urine sample is needed, but is unable to provide one at this time. 

## 2017-04-25 NOTE — ED Notes (Signed)
Patient is alert and oriented x3.  He was given DC instructions and follow up visit instructions.  Patient gave verbal understanding.  He was DC ambulatory under his own power to home.  V/S stable.  He was not showing any signs of distress on DC 

## 2017-04-25 NOTE — ED Triage Notes (Signed)
Patient reports that he has been having mid abdominal burning pain, vomiting, diarrhea, and urinary frequncy x 2 days.

## 2019-04-02 ENCOUNTER — Other Ambulatory Visit: Payer: Self-pay

## 2019-04-02 ENCOUNTER — Encounter (HOSPITAL_COMMUNITY): Payer: Self-pay | Admitting: Emergency Medicine

## 2019-04-02 ENCOUNTER — Ambulatory Visit (HOSPITAL_COMMUNITY)
Admission: EM | Admit: 2019-04-02 | Discharge: 2019-04-02 | Disposition: A | Discharge status: Home / Self Care | Payer: BC Managed Care – PPO | Attending: Family Medicine | Admitting: Family Medicine

## 2019-04-02 DIAGNOSIS — R112 Nausea with vomiting, unspecified: Secondary | ICD-10-CM | POA: Diagnosis not present

## 2019-04-02 DIAGNOSIS — R197 Diarrhea, unspecified: Secondary | ICD-10-CM | POA: Diagnosis not present

## 2019-04-02 DIAGNOSIS — I10 Essential (primary) hypertension: Secondary | ICD-10-CM

## 2019-04-02 DIAGNOSIS — E860 Dehydration: Secondary | ICD-10-CM | POA: Insufficient documentation

## 2019-04-02 DIAGNOSIS — R748 Abnormal levels of other serum enzymes: Secondary | ICD-10-CM | POA: Diagnosis not present

## 2019-04-02 LAB — COMPREHENSIVE METABOLIC PANEL
ALT: 110 U/L — ABNORMAL HIGH (ref 0–44)
AST: 179 U/L — ABNORMAL HIGH (ref 15–41)
Albumin: 3.5 g/dL (ref 3.5–5.0)
Alkaline Phosphatase: 130 U/L — ABNORMAL HIGH (ref 38–126)
Anion gap: 11 (ref 5–15)
BUN: 5 mg/dL — ABNORMAL LOW (ref 6–20)
CO2: 25 mmol/L (ref 22–32)
Calcium: 9.1 mg/dL (ref 8.9–10.3)
Chloride: 94 mmol/L — ABNORMAL LOW (ref 98–111)
Creatinine, Ser: 0.96 mg/dL (ref 0.61–1.24)
GFR calc Af Amer: >60 mL/min (ref >60)
GFR calc non Af Amer: >60 mL/min (ref >60)
Glucose, Bld: 104 mg/dL — ABNORMAL HIGH (ref 70–99)
Potassium: 3.6 mmol/L (ref 3.5–5.1)
Sodium: 130 mmol/L — ABNORMAL LOW (ref 135–145)
Total Bilirubin: 1.5 mg/dL — ABNORMAL HIGH (ref 0.3–1.2)
Total Protein: 7 g/dL (ref 6.5–8.1)

## 2019-04-02 LAB — CBC WITH DIFFERENTIAL/PLATELET
Abs Immature Granulocytes: 0.03 10*3/uL (ref 0.00–0.07)
Basophils Absolute: 0 10*3/uL (ref 0.0–0.1)
Basophils Relative: 0 %
Eosinophils Absolute: 0 10*3/uL (ref 0.0–0.5)
Eosinophils Relative: 1 %
HCT: 44.9 % (ref 39.0–52.0)
Hemoglobin: 15.8 g/dL (ref 13.0–17.0)
Immature Granulocytes: 1 %
Lymphocytes Relative: 37 %
Lymphs Abs: 2.4 10*3/uL (ref 0.7–4.0)
MCH: 30.9 pg (ref 26.0–34.0)
MCHC: 35.2 g/dL (ref 30.0–36.0)
MCV: 87.9 fL (ref 80.0–100.0)
Monocytes Absolute: 0.6 10*3/uL (ref 0.1–1.0)
Monocytes Relative: 9 %
Neutro Abs: 3.4 10*3/uL (ref 1.7–7.7)
Neutrophils Relative %: 52 %
Platelets: 133 10*3/uL — ABNORMAL LOW (ref 150–400)
RBC: 5.11 MIL/uL (ref 4.22–5.81)
RDW: 13.2 % (ref 11.5–15.5)
WBC: 6.4 10*3/uL (ref 4.0–10.5)
nRBC: 0 % (ref 0.0–0.2)

## 2019-04-02 MED ORDER — SODIUM CHLORIDE 0.9 % IV BOLUS
1000.0000 mL | Freq: Once | INTRAVENOUS | Status: AC
  Administered 2019-04-02: 1000 mL via INTRAVENOUS

## 2019-04-02 MED ORDER — AMLODIPINE BESYLATE 10 MG PO TABS: 10.0000 mg | ORAL_TABLET | Freq: Every day | ORAL | 1 refills | Status: DC

## 2019-04-02 NOTE — ED Provider Notes (Addendum)
[facility]    CSN: [id number] Arrival date & time: 04/02/19  [id number]     History   Chief Complaint Chief Complaint  Patient presents with  . Fall  . Vomiting    HPI Cameron Rogers is a 53 y.o. male.   Is a 53 year old male with past medical history of hypertension.  He presents today with nausea, vomiting and diarrhea.  Reporting that he got up to go to work on Sunday  was running late and tripped, falling on his left side.  He then went to work and during the workday started not feeling well and had 4-5 episodes of vomiting.  He was then sent home where he had 4-5 episodes of diarrhea.  Denies any fevers.  Symptoms have somewhat improved since.  He has felt slightly dehydrated and weak.  Patient drinks 2, 40 ounce beers or more a day.  Last drink was last night.  Denies any abdominal pain.  He was able to eat to eggs yesterday morning and kept those down.  He ate crackers and ginger ale here in lobby and kept that down.  He feels as if he has lost a lot of weight. He did take some vitamins on an empty stomach prior to this starting. He is feeling nervous. No cough, chest congestion, runny nose, sore throat or ear pain.  Denies any chest pain or shortness of breath.  No no recent sick contacts or recent traveling.  ROS per HPI      Past Medical History:  Diagnosis Date  . Hypertension     There are no active problems to display for this patient.   History reviewed. No pertinent surgical history.     Home Medications    Prior to Admission medications   Medication Sig Start Date End Date Taking? Authorizing Provider  Cyanocobalamin (VITAMIN B-12 PO) Take by mouth.   Yes [provider]  amLODipine (NORVASC) 10 MG tablet Take 1 tablet (10 mg total) by mouth daily. 04/02/19   Janace ArisBast, Hamdi Vari A, NP  Multiple Vitamins-Minerals (COMPLETE ENERGY) TABS Take 1 tablet by mouth daily.    [provider]  hydrochlorothiazide (HYDRODIURIL) 25 MG tablet Take 1  tablet (25 mg total) by mouth daily. 06/02/16 04/02/19  Anders SimmondsMcClung, Angela M, PA-C  omeprazole (PRILOSEC) 20 MG capsule Take 1 capsule (20 mg total) by mouth daily. 04/25/17 04/02/19  Benjiman CorePickering, Nathan, MD  promethazine (PHENERGAN) 25 MG tablet Take 1 tablet (25 mg total) by mouth every 8 (eight) hours as needed for nausea or vomiting. 04/25/17 04/02/19  Benjiman CorePickering, Nathan, MD    Family History History reviewed. No pertinent family history.  Social History Social History   Tobacco Use  . Smoking status: Current Every Day Smoker    Packs/day: 0.50    Types: Cigarettes  . Smokeless tobacco: Never Used  Substance Use Topics  . Alcohol use: Yes    Comment: occasional  . Drug use: No     Allergies   Patient has no known allergies.   Review of Systems Review of Systems   Physical Exam Triage Vital Signs ED Triage Vitals  Enc Vitals Group     BP 04/02/19 1000 (!) 183/124     Pulse Rate 04/02/19 1000 (!) 112     Resp 04/02/19 1000 20     Temp 04/02/19 1000 99.1 F (37.3 C)     Temp Source 04/02/19 1000 Oral     SpO2 04/02/19 1000 100 %     Weight --  Height --      Head Circumference --      Peak Flow --      Pain Score 04/02/19 0953 2     Pain Loc --      Pain Edu? --      Excl. in GC? --    No data found.  Updated Vital Signs BP (!) 155/104 (BP Location: Right Arm)   Pulse 90   Temp 98.5 F (36.9 C) (Oral)   Resp 18   Wt 171 lb (77.6 kg)   SpO2 100%   BMI 21.96 kg/m   Visual Acuity Right Eye Distance:   Left Eye Distance:   Bilateral Distance:    Right Eye Near:   Left Eye Near:    Bilateral Near:     Physical Exam Vitals signs and nursing note reviewed.  Constitutional:      General: He is not in acute distress.    Appearance: He is not ill-appearing, toxic-appearing or diaphoretic.     Comments: Visible hand tremors.   HENT:     Head: Normocephalic and atraumatic.     Nose: Nose normal.     Mouth/Throat:     Mouth: Mucous membranes are dry.   Eyes:     General: Scleral icterus present.     Extraocular Movements: Extraocular movements intact.     Pupils: Pupils are equal, round, and reactive to light.  Neck:     Musculoskeletal: Normal range of motion.  Cardiovascular:     Rate and Rhythm: Normal rate and regular rhythm.     Pulses: Normal pulses.     Heart sounds: Normal heart sounds.  Pulmonary:     Effort: Pulmonary effort is normal.     Breath sounds: Normal breath sounds.  Abdominal:     Palpations: Abdomen is soft.     Tenderness: There is no abdominal tenderness.  Musculoskeletal: Normal range of motion.  Skin:    General: Skin is warm and dry.  Neurological:     Mental Status: He is alert.  Psychiatric:        Mood and Affect: Mood normal.      UC Treatments / Results  Labs (all labs ordered are listed, but only abnormal results are displayed) Labs Reviewed  CBC WITH DIFFERENTIAL/PLATELET - Abnormal; Notable for the following components:      Result Value   Platelets 133 (*)    All other components within normal limits  COMPREHENSIVE METABOLIC PANEL - Abnormal; Notable for the following components:   Sodium 130 (*)    Chloride 94 (*)    Glucose, Bld 104 (*)    BUN 5 (*)    AST 179 (*)    ALT 110 (*)    Alkaline Phosphatase 130 (*)    Total Bilirubin 1.5 (*)    All other components within normal limits    EKG None  Radiology No results found.  Procedures Procedures (including critical care time)  Medications Ordered in UC Medications  sodium chloride 0.9 % bolus 1,000 mL (1,000 mLs Intravenous New Bag/Given 04/02/19 1114)    Initial Impression / Assessment and Plan / UC Course  I have reviewed the triage vital signs and the nursing notes.  Pertinent labs & imaging results that were available during my care of the patient were reviewed by me and considered in my medical decision making (see chart for details).     Patient is a 53 year old male with past medical history of  hypertension.  He is here today with dehydration from vomiting that occurred 2 days ago. He is still been drinking beer during this time.  He admits to 2, 40 ounce beers daily. His symptoms have improved since Sunday.  He has been able to hold down fluids and food. He is mildly tachycardic here and hypertensive.  He has not been taking his blood pressure medication. He does not have a primary care provider. Scleral icterus on exam with tremors.  We will give normal saline bolus to correct dehydration and get basic lab work to check kidney function and liver function. EKG due to uncontrolled high blood pressure.  Lab work revealed dehydration and elevated liver enzymes. Patient rehydrated in clinic.  Feeling slightly better.  Heart rate normalized. VSS, non toxic and without acute abdomen.  EKG with normal sinus rhythm, biatrial enlargement and incomplete right bundle branch block.  No concerns for ACS. We will refill patient's blood pressure medication and have him start taking that today.  Recommended to cut back on the drinking due to elevated liver enzymes. Contact put on discharge instructions for primary care provider to follow-up Recommended if symptoms return or worsen he will need to go the ER. Pt understanding and agreed.   Final Clinical Impressions(s) / UC Diagnoses   Final diagnoses:  Dehydration  Elevated liver enzymes  Nausea vomiting and diarrhea     Discharge Instructions     Your blood work shows some dehydration.  We rehydrated you here in clinic today. Your liver enzymes were slightly elevated most likely from alcohol use Recommend sipping fluids to include Gatorade and water and cutting back on alcohol use. I will refill your blood pressure medications today I am also giving you contact for a primary care provider.  You need to follow-up to have further work-up done    ED Prescriptions    Medication Sig Dispense Auth. Provider   amLODipine (NORVASC) 10 MG  tablet Take 1 tablet (10 mg total) by mouth daily. 30 tablet Loura Halt A, NP     Controlled Substance Prescriptions [place] Controlled Substance Registry consulted? Not Applicable        Orvan July, NP 04/02/19 1444

## 2019-04-02 NOTE — ED Triage Notes (Addendum)
Patient fell Sunday morning.  Patient got out of bed in a rush, fell on left side.  Patient thinks he got up too quick  Patient ha left arm pain, unable to pick left arm up above his head  Vomited 2-3 times on Sunday.  Patient had loose stool, no appetite  Ate crackers and soda in lobby

## 2019-04-02 NOTE — Discharge Instructions (Signed)
Your blood work shows some dehydration.  We rehydrated you here in clinic today. Your liver enzymes were slightly elevated most likely from alcohol use Recommend sipping fluids to include Gatorade and water and cutting back on alcohol use. I will refill your blood pressure medications today I am also giving you contact for a primary care provider.  You need to follow-up to have further work-up done

## 2019-04-09 ENCOUNTER — Ambulatory Visit (INDEPENDENT_AMBULATORY_CARE_PROVIDER_SITE_OTHER): Payer: BC Managed Care – PPO | Admitting: Family Medicine

## 2019-04-09 ENCOUNTER — Other Ambulatory Visit: Payer: Self-pay

## 2019-04-09 DIAGNOSIS — Z7289 Other problems related to lifestyle: Secondary | ICD-10-CM | POA: Diagnosis not present

## 2019-04-09 DIAGNOSIS — R748 Abnormal levels of other serum enzymes: Secondary | ICD-10-CM

## 2019-04-09 DIAGNOSIS — R739 Hyperglycemia, unspecified: Secondary | ICD-10-CM

## 2019-04-09 DIAGNOSIS — Z789 Other specified health status: Secondary | ICD-10-CM

## 2019-04-09 DIAGNOSIS — Z114 Encounter for screening for human immunodeficiency virus [HIV]: Secondary | ICD-10-CM

## 2019-04-09 DIAGNOSIS — I10 Essential (primary) hypertension: Secondary | ICD-10-CM | POA: Diagnosis not present

## 2019-04-09 NOTE — Progress Notes (Signed)
Virtual Visit via Telephone Note  I connected with [name] on 04/09/19 at  3:10 PM EDT by telephone and verified that I am speaking with the correct person using two identifiers.  Location: Patient: Located at home during today's encounter  Provider: Located at primary care office     I discussed the limitations, risks, security and privacy concerns of performing an evaluation and management service by telephone and the availability of in person appointments. I also discussed with the patient that there may be a patient responsible charge related to this service. The patient expressed understanding and agreed to proceed.   History of Present Illness: Cameron Rogers is present on today's encounter to establish care.  Patient was seen at [facility] urgent care on 04/02/2019 with a complaint of nausea, vomiting and was found to be dehydrated and has severely elevated liver enzymes.  Patient endorses very heavy alcohol consumption with an average of 3-40 ounces of beer on a daily basis.  He reports that he drinks to reduce stress and is currently going through several life stressors at present.  He was found to be extremely hypertensive.  [facility] and at that time was prescribed blood pressure medicine.  He never returned for follow-up evaluation and had been off of blood pressure medication since that time.  He denies any chest pain, shortness of breath, unintended weight gain, weakness, headache, or dizziness.  He was started on amlodipine 10 mg once daily during recent urgent care visit and reports he obtained medication that day and has been taking consistently since that time.  He does not have a blood pressure cuff and has not been checking blood pressure at home.  He reports that he has cut back significantly on alcohol consumption since finding out about his liver as this was very concerning to him.  He is overdue for  physical. Assessment and Plan: 1. Essential hypertension Recently started on amlodipine.  Uncertain of control.  Patient will return to office in 2 weeks will evaluate blood pressure control at that time.  Advised to take medication prior to follow-up. - CBC with Differential - Comprehensive metabolic panel  2. Screening for HIV without presence of risk factors - HIV antibody (with reflex), will collect at future visit.  3. Hyperglycemia - Hemoglobin A1c,  4. Elevated liver enzymes Patient will follow-up here in office in 2 weeks we will repeat liver enzymes.  If liver enzymes remain elevated will obtain a liver ultrasound.  5. Alcohol use Patient reports that he has decreased alcohol consumption.  Counseled on the importance of reducing alcohol as it is currently affecting his liver.  Follow Up Instructions: CPE 2 weeks and Hypertension evaluation    I discussed the assessment and treatment plan with the patient. The patient was provided an opportunity to ask questions and all were answered. The patient agreed with the plan and demonstrated an understanding of the instructions.   The patient was advised to call back or seek an in-person evaluation if the symptoms worsen or if the condition fails to improve as anticipated.  I provided 30 minutes of non-face-to-face time during this encounter.   Molli Barrows, FNP

## 2019-04-09 NOTE — Progress Notes (Deleted)
Called patient to initiate their telephone visit with provider Molli Barrows, FNP-C. Verified date of birth. Patient following up from urgent care visit 04/02/2019. Was seen for dehydration, nausea, vomiting, diarrhea & weight loss. ***. KWalker, CMA.

## 2019-04-25 ENCOUNTER — Telehealth: Payer: Self-pay

## 2019-04-25 NOTE — Telephone Encounter (Signed)
Called patient to do their pre-visit COVID screening.  Call went to voicemail that isn't set up. Unable to do prescreening.  

## 2019-04-26 ENCOUNTER — Other Ambulatory Visit: Payer: Self-pay | Admitting: Family Medicine

## 2019-04-26 ENCOUNTER — Other Ambulatory Visit: Payer: Self-pay

## 2019-04-26 ENCOUNTER — Ambulatory Visit (INDEPENDENT_AMBULATORY_CARE_PROVIDER_SITE_OTHER): Payer: BC Managed Care – PPO

## 2019-04-26 VITALS — BP 144/89 | HR 103 | Temp 97.5°F | Resp 17 | Wt 165.0 lb

## 2019-04-26 DIAGNOSIS — Z130 Encounter for screening for diseases of the blood and blood-forming organs and certain disorders involving the immune mechanism: Secondary | ICD-10-CM | POA: Diagnosis not present

## 2019-04-26 DIAGNOSIS — Z131 Encounter for screening for diabetes mellitus: Secondary | ICD-10-CM

## 2019-04-26 DIAGNOSIS — Z1329 Encounter for screening for other suspected endocrine disorder: Secondary | ICD-10-CM

## 2019-04-26 DIAGNOSIS — Z13220 Encounter for screening for lipoid disorders: Secondary | ICD-10-CM

## 2019-04-26 DIAGNOSIS — Z1389 Encounter for screening for other disorder: Secondary | ICD-10-CM

## 2019-04-26 DIAGNOSIS — Z125 Encounter for screening for malignant neoplasm of prostate: Secondary | ICD-10-CM | POA: Diagnosis not present

## 2019-04-26 DIAGNOSIS — I10 Essential (primary) hypertension: Secondary | ICD-10-CM

## 2019-04-26 DIAGNOSIS — Z114 Encounter for screening for human immunodeficiency virus [HIV]: Secondary | ICD-10-CM

## 2019-04-26 LAB — POCT URINALYSIS DIP (CLINITEK)
Bilirubin, UA: NEGATIVE
Blood, UA: NEGATIVE
Glucose, UA: NEGATIVE mg/dL
Ketones, POC UA: NEGATIVE mg/dL
Nitrite, UA: NEGATIVE
POC PROTEIN,UA: NEGATIVE
Spec Grav, UA: 1.025 (ref 1.010–1.025)
Urobilinogen, UA: 1 E.U./dL
pH, UA: 5.5 (ref 5.0–8.0)

## 2019-04-26 MED ORDER — AMLODIPINE BESYLATE 10 MG PO TABS: 10.0000 mg | ORAL_TABLET | Freq: Every day | ORAL | 3 refills | Status: DC

## 2019-04-26 MED ORDER — LOSARTAN POTASSIUM 25 MG PO TABS: 25.0000 mg | ORAL_TABLET | Freq: Every day | ORAL | 1 refills | Status: DC

## 2019-04-26 NOTE — Progress Notes (Signed)
Patient here for fasting labs & full visit. KWalker, CMA.

## 2019-04-26 NOTE — Patient Instructions (Signed)
I have refilled Amodipine 10 mg and added Losartan 25 mg to your blood pressure treatment.  You will return in 4 weeks for follow-up.  We will notify by phone of your lab results.     Hypertension, Adult Hypertension is another name for high blood pressure. High blood pressure forces your heart to work harder to pump blood. This can cause problems over time. There are two numbers in a blood pressure reading. There is a top number (systolic) over a bottom number (diastolic). It is best to have a blood pressure that is below 120/80. Healthy choices can help lower your blood pressure, or you may need medicine to help lower it. What are the causes? The cause of this condition is not known. Some conditions may be related to high blood pressure. What increases the risk?  Smoking.  Having type 2 diabetes mellitus, high cholesterol, or both.  Not getting enough exercise or physical activity.  Being overweight.  Having too much fat, sugar, calories, or salt (sodium) in your diet.  Drinking too much alcohol.  Having long-term (chronic) kidney disease.  Having a family history of high blood pressure.  Age. Risk increases with age.  Race. You may be at higher risk if you are African American.  Gender. Men are at higher risk than women before age 47. After age 59, women are at higher risk than men.  Having obstructive sleep apnea.  Stress. What are the signs or symptoms?  High blood pressure may not cause symptoms. Very high blood pressure (hypertensive crisis) may cause: ? Headache. ? Feelings of worry or nervousness (anxiety). ? Shortness of breath. ? Nosebleed. ? A feeling of being sick to your stomach (nausea). ? Throwing up (vomiting). ? Changes in how you see. ? Very bad chest pain. ? Seizures. How is this treated?  This condition is treated by making healthy lifestyle changes, such as: ? Eating healthy foods. ? Exercising more. ? Drinking less alcohol.  Your  health care provider may prescribe medicine if lifestyle changes are not enough to get your blood pressure under control, and if: ? Your top number is above 130. ? Your bottom number is above 80.  Your personal target blood pressure may vary. Follow these instructions at home: Eating and drinking   If told, follow the DASH eating plan. To follow this plan: ? Fill one half of your plate at each meal with fruits and vegetables. ? Fill one fourth of your plate at each meal with whole grains. Whole grains include whole-wheat pasta, brown rice, and whole-grain bread. ? Eat or drink low-fat dairy products, such as skim milk or low-fat yogurt. ? Fill one fourth of your plate at each meal with low-fat (lean) proteins. Low-fat proteins include fish, chicken without skin, eggs, beans, and tofu. ? Avoid fatty meat, cured and processed meat, or chicken with skin. ? Avoid pre-made or processed food.  Eat less than 1,500 mg of salt each day.  Do not drink alcohol if: ? Your doctor tells you not to drink. ? You are pregnant, may be pregnant, or are planning to become pregnant.  If you drink alcohol: ? Limit how much you use to:  0-1 drink a day for women.  0-2 drinks a day for men. ? Be aware of how much alcohol is in your drink. In the [place] one drink equals one 12 oz bottle of beer (355 mL), one 5 oz glass of wine (148 mL), or one 1 oz glass of hard  liquor (44 mL). Lifestyle   Work with your doctor to stay at a healthy weight or to lose weight. Ask your doctor what the best weight is for you.  Get at least 30 minutes of exercise most days of the week. This may include walking, swimming, or biking.  Get at least 30 minutes of exercise that strengthens your muscles (resistance exercise) at least 3 days a week. This may include lifting weights or doing Pilates.  Do not use any products that contain nicotine or tobacco, such as cigarettes, e-cigarettes, and chewing tobacco. If you need help  quitting, ask your doctor.  Check your blood pressure at home as told by your doctor.  Keep all follow-up visits as told by your doctor. This is important. Medicines  Take over-the-counter and prescription medicines only as told by your doctor. Follow directions carefully.  Do not skip doses of blood pressure medicine. The medicine does not work as well if you skip doses. Skipping doses also puts you at risk for problems.  Ask your doctor about side effects or reactions to medicines that you should watch for. Contact a doctor if you:  Think you are having a reaction to the medicine you are taking.  Have headaches that keep coming back (recurring).  Feel dizzy.  Have swelling in your ankles.  Have trouble with your vision. Get help right away if you:  Get a very bad headache.  Start to feel mixed up (confused).  Feel weak or numb.  Feel faint.  Have very bad pain in your: ? Chest. ? Belly (abdomen).  Throw up more than once.  Have trouble breathing. Summary  Hypertension is another name for high blood pressure.  High blood pressure forces your heart to work harder to pump blood.  For most people, a normal blood pressure is less than 120/80.  Making healthy choices can help lower blood pressure. If your blood pressure does not get lower with healthy choices, you may need to take medicine. This information is not intended to replace advice given to you by your health care provider. Make sure you discuss any questions you have with your health care provider. Document Released: 03/21/2008 Document Revised: 06/13/2018 Document Reviewed: 06/13/2018 Elsevier Patient Education  2020 Elsevier Inc.     DASH Eating Plan DASH stands for "Dietary Approaches to Stop Hypertension." The DASH eating plan is a healthy eating plan that has been shown to reduce high blood pressure (hypertension). It may also reduce your risk for type 2 diabetes, heart disease, and stroke. The  DASH eating plan may also help with weight loss. What are tips for following this plan?  General guidelines  Avoid eating more than 2,300 mg (milligrams) of salt (sodium) a day. If you have hypertension, you may need to reduce your sodium intake to 1,500 mg a day.  Limit alcohol intake to no more than 1 drink a day for nonpregnant women and 2 drinks a day for men. One drink equals 12 oz of beer, 5 oz of wine, or 1 oz of hard liquor.  Work with your health care provider to maintain a healthy body weight or to lose weight. Ask what an ideal weight is for you.  Get at least 30 minutes of exercise that causes your heart to beat faster (aerobic exercise) most days of the week. Activities may include walking, swimming, or biking.  Work with your health care provider or diet and nutrition specialist (dietitian) to adjust your eating plan to your individual  calorie needs. Reading food labels   Check food labels for the amount of sodium per serving. Choose foods with less than 5 percent of the Daily Value of sodium. Generally, foods with less than 300 mg of sodium per serving fit into this eating plan.  To find whole grains, look for the word "whole" as the first word in the ingredient list. Shopping  Buy products labeled as "low-sodium" or "no salt added."  Buy fresh foods. Avoid canned foods and premade or frozen meals. Cooking  Avoid adding salt when cooking. Use salt-free seasonings or herbs instead of table salt or sea salt. Check with your health care provider or pharmacist before using salt substitutes.  Do not fry foods. Cook foods using healthy methods such as baking, boiling, grilling, and broiling instead.  Cook with heart-healthy oils, such as olive, canola, soybean, or sunflower oil. Meal planning  Eat a balanced diet that includes: ? 5 or more servings of fruits and vegetables each day. At each meal, try to fill half of your plate with fruits and vegetables. ? Up to 6-8  servings of whole grains each day. ? Less than 6 oz of lean meat, poultry, or fish each day. A 3-oz serving of meat is about the same size as a deck of cards. One egg equals 1 oz. ? 2 servings of low-fat dairy each day. ? A serving of nuts, seeds, or beans 5 times each week. ? Heart-healthy fats. Healthy fats called Omega-3 fatty acids are found in foods such as flaxseeds and coldwater fish, like sardines, salmon, and mackerel.  Limit how much you eat of the following: ? Canned or prepackaged foods. ? Food that is high in trans fat, such as fried foods. ? Food that is high in saturated fat, such as fatty meat. ? Sweets, desserts, sugary drinks, and other foods with added sugar. ? Full-fat dairy products.  Do not salt foods before eating.  Try to eat at least 2 vegetarian meals each week.  Eat more home-cooked food and less restaurant, buffet, and fast food.  When eating at a restaurant, ask that your food be prepared with less salt or no salt, if possible. What foods are recommended? The items listed may not be a complete list. Talk with your dietitian about what dietary choices are best for you. Grains Whole-grain or whole-wheat bread. Whole-grain or whole-wheat pasta. Brown rice. Orpah Cobbatmeal. Quinoa. Bulgur. Whole-grain and low-sodium cereals. Pita bread. Low-fat, low-sodium crackers. Whole-wheat flour tortillas. Vegetables Fresh or frozen vegetables (raw, steamed, roasted, or grilled). Low-sodium or reduced-sodium tomato and vegetable juice. Low-sodium or reduced-sodium tomato sauce and tomato paste. Low-sodium or reduced-sodium canned vegetables. Fruits All fresh, dried, or frozen fruit. Canned fruit in natural juice (without added sugar). Meat and other protein foods Skinless chicken or Malawiturkey. Ground chicken or Mal[place]urkey. Pork with fat trimmed off. Fish and seafood. Egg whites. Dried beans, peas, or lentils. Unsalted nuts, nut butters, and seeds. Unsalted canned beans. Lean cuts of beef  with fat trimmed off. Low-sodium, lean deli meat. Dairy Low-fat (1%) or fat-free (skim) milk. Fat-free, low-fat, or reduced-fat cheeses. Nonfat, low-sodium ricotta or cottage cheese. Low-fat or nonfat yogurt. Low-fat, low-sodium cheese. Fats and oils Soft margarine without trans fats. Vegetable oil. Low-fat, reduced-fat, or light mayonnaise and salad dressings (reduced-sodium). Canola, safflower, olive, soybean, and sunflower oils. Avocado. Seasoning and other foods Herbs. Spices. Seasoning mixes without salt. Unsalted popcorn and pretzels. Fat-free sweets. What foods are not recommended? The items listed may not be a  complete list. Talk with your dietitian about what dietary choices are best for you. Grains Baked goods made with fat, such as croissants, muffins, or some breads. Dry pasta or rice meal packs. Vegetables Creamed or fried vegetables. Vegetables in a cheese sauce. Regular canned vegetables (not low-sodium or reduced-sodium). Regular canned tomato sauce and paste (not low-sodium or reduced-sodium). Regular tomato and vegetable juice (not low-sodium or reduced-sodium). Rosita FirePickles. Olives. Fruits Canned fruit in a light or heavy syrup. Fried fruit. Fruit in cream or butter sauce. Meat and other protein foods Fatty cuts of meat. Ribs. Fried meat. Tomasa BlaseBacon. Sausage. Bologna and other processed lunch meats. Salami. Fatback. Hotdogs. Bratwurst. Salted nuts and seeds. Canned beans with added salt. Canned or smoked fish. Whole eggs or egg yolks. Chicken or Malaw[place]urkey with skin. Dairy Whole or 2% milk, cream, and half-and-half. Whole or full-fat cream cheese. Whole-fat or sweetened yogurt. Full-fat cheese. Nondairy creamers. Whipped toppings. Processed cheese and cheese spreads. Fats and oils Butter. Stick margarine. Lard. Shortening. Ghee. Bacon fat. Tropical oils, such as coconut, palm kernel, or palm oil. Seasoning and other foods Salted popcorn and pretzels. Onion salt, garlic salt, seasoned salt,  table salt, and sea salt. Worcestershire sauce. Tartar sauce. Barbecue sauce. Teriyaki sauce. Soy sauce, including reduced-sodium. Steak sauce. Canned and packaged gravies. Fish sauce. Oyster sauce. Cocktail sauce. Horseradish that you find on the shelf. Ketchup. Mustard. Meat flavorings and tenderizers. Bouillon cubes. Hot sauce and Tabasco sauce. Premade or packaged marinades. Premade or packaged taco seasonings. Relishes. Regular salad dressings. Where to find more information:  National Heart, Lung, and Blood Institute: PopSteam.iswww.nhlbi.nih.gov  American Heart Association: www.heart.org Summary  The DASH eating plan is a healthy eating plan that has been shown to reduce high blood pressure (hypertension). It may also reduce your risk for type 2 diabetes, heart disease, and stroke.  With the DASH eating plan, you should limit salt (sodium) intake to 2,300 mg a day. If you have hypertension, you may need to reduce your sodium intake to 1,500 mg a day.  When on the DASH eating plan, aim to eat more fresh fruits and vegetables, whole grains, lean proteins, low-fat dairy, and heart-healthy fats.  Work with your health care provider or diet and nutrition specialist (dietitian) to adjust your eating plan to your individual calorie needs. This information is not intended to replace advice given to you by your health care provider. Make sure you discuss any questions you have with your health care provider. Document Released: 09/22/2011 Document Revised: 09/15/2017 Document Reviewed: 09/26/2016 Elsevier Patient Education  2020 [employer]

## 2019-04-26 NOTE — Addendum Note (Signed)
Addended by: Carylon Perches on: 04/26/2019 09:48 AM   Modules accepted: Orders

## 2019-04-27 LAB — LIPID PANEL
Chol/HDL Ratio: 2.5 ratio (ref 0.0–5.0)
Cholesterol, Total: 135 mg/dL (ref 100–199)
HDL: 54 mg/dL (ref >39)
LDL Calculated: 66 mg/dL (ref 0–99)
Triglycerides: 74 mg/dL (ref 0–149)
VLDL Cholesterol Cal: 15 mg/dL (ref 5–40)

## 2019-04-27 LAB — CBC WITH DIFFERENTIAL/PLATELET
Basophils Absolute: 0 10*3/uL (ref 0.0–0.2)
Basos: 0 %
EOS (ABSOLUTE): 0 10*3/uL (ref 0.0–0.4)
Eos: 0 %
Hematocrit: 41.2 % (ref 37.5–51.0)
Hemoglobin: 13.8 g/dL (ref 13.0–17.7)
Immature Grans (Abs): 0 10*3/uL (ref 0.0–0.1)
Immature Granulocytes: 0 %
Lymphocytes Absolute: 2.9 10*3/uL (ref 0.7–3.1)
Lymphs: 37 %
MCH: 31.2 pg (ref 26.6–33.0)
MCHC: 33.5 g/dL (ref 31.5–35.7)
MCV: 93 fL (ref 79–97)
Monocytes Absolute: 0.7 10*3/uL (ref 0.1–0.9)
Monocytes: 8 %
Neutrophils Absolute: 4.2 10*3/uL (ref 1.4–7.0)
Neutrophils: 55 %
Platelets: 162 10*3/uL (ref 150–450)
RBC: 4.42 x10E6/uL (ref 4.14–5.80)
RDW: 13.5 % (ref 11.6–15.4)
WBC: 7.8 10*3/uL (ref 3.4–10.8)

## 2019-04-27 LAB — PSA, TOTAL AND FREE
PSA, Free Pct: 14.6 %
PSA, Free: 0.7 ng/mL
Prostate Specific Ag, Serum: 4.8 ng/mL — ABNORMAL HIGH (ref 0.0–4.0)

## 2019-04-27 LAB — HEMOGLOBIN A1C
Est. average glucose Bld gHb Est-mCnc: 123 mg/dL
Hgb A1c MFr Bld: 5.9 % — ABNORMAL HIGH (ref 4.8–5.6)

## 2019-04-27 LAB — COMPREHENSIVE METABOLIC PANEL
ALT: 77 IU/L — ABNORMAL HIGH (ref 0–44)
AST: 62 IU/L — ABNORMAL HIGH (ref 0–40)
Albumin/Globulin Ratio: 1.5 (ref 1.2–2.2)
Albumin: 4 g/dL (ref 3.8–4.9)
Alkaline Phosphatase: 101 IU/L (ref 39–117)
BUN/Creatinine Ratio: 16 (ref 9–20)
BUN: 13 mg/dL (ref 6–24)
Bilirubin Total: 0.6 mg/dL (ref 0.0–1.2)
CO2: 23 mmol/L (ref 20–29)
Calcium: 9.4 mg/dL (ref 8.7–10.2)
Chloride: 100 mmol/L (ref 96–106)
Creatinine, Ser: 0.81 mg/dL (ref 0.76–1.27)
GFR calc Af Amer: 117 mL/min/{1.73_m2} (ref >59)
GFR calc non Af Amer: 101 mL/min/{1.73_m2} (ref >59)
Globulin, Total: 2.7 g/dL (ref 1.5–4.5)
Glucose: 80 mg/dL (ref 65–99)
Potassium: 4.3 mmol/L (ref 3.5–5.2)
Sodium: 139 mmol/L (ref 134–144)
Total Protein: 6.7 g/dL (ref 6.0–8.5)

## 2019-04-27 LAB — THYROID PANEL WITH TSH
Free Thyroxine Index: 1.7 (ref 1.2–4.9)
T3 Uptake Ratio: 30 % (ref 24–39)
T4, Total: 5.7 ug/dL (ref 4.5–12.0)
TSH: 0.391 u[IU]/mL — ABNORMAL LOW (ref 0.450–4.500)

## 2019-04-27 LAB — HIV ANTIBODY (ROUTINE TESTING W REFLEX): HIV Screen 4th Generation wRfx: NONREACTIVE

## 2019-04-28 ENCOUNTER — Telehealth: Payer: Self-pay | Admitting: Family Medicine

## 2019-04-28 DIAGNOSIS — R7989 Other specified abnormal findings of blood chemistry: Secondary | ICD-10-CM

## 2019-04-28 DIAGNOSIS — R748 Abnormal levels of other serum enzymes: Secondary | ICD-10-CM

## 2019-04-28 DIAGNOSIS — R972 Elevated prostate specific antigen [PSA]: Secondary | ICD-10-CM

## 2019-04-28 NOTE — Telephone Encounter (Signed)
Notify the patient of the following related to his recent labs: Liver enzymes are still elevated although improving. Needs to be schedule for a liver US. Continue to reduce drinking of alcohol. PSA level is elevated and will require close monitoring. I am referring him to urology for further evaluation.  Thyroid function is abnormal and will need to be repeated in at next follow-up. A1C indicated prediabetes-Presently, I recommend lifestyle changes such as engaging in routine physical activity with a goal of 150 minutes per week, increasing intake of vegetables, fruits, fiber, and selecting lean cuts of meat.

## 2019-04-29 NOTE — Telephone Encounter (Signed)
Patient voicemail has not been set up yet. Nat Christen, CMA

## 2019-04-30 NOTE — Telephone Encounter (Signed)
Attempted to call patient with results & [place] appointment. Voicemail hasn't been set up.

## 2019-04-30 NOTE — Telephone Encounter (Signed)
Called to schedule [place]. [place] is scheduled for 05/03/2019 @ 10:15 AM.

## 2019-05-02 NOTE — Telephone Encounter (Signed)
Patient verified date of birth. He is aware that liver enzymes are still elevated although improving. [place] rescheduled for 05/10/2019 at 11am. Patient advised to continue reducing alcohol consumption. Patient aware that PSA is elevated and will require close monitoring; referred to urology for further evaluation. Thyroid function abnormal will recheck at next office visit. Patient aware that a1c indicates prediabetes at this time. Lifestyle changes such as engaging in routine physical activity with a goal of 150 minutes per week, increase intake of vegetables, fruits, fiber and select lean cuts of meats. Patient did not have any questions.Nat Christen, CMA

## 2019-05-03 ENCOUNTER — Ambulatory Visit (HOSPITAL_COMMUNITY): Payer: BC Managed Care – PPO

## 2019-05-10 ENCOUNTER — Ambulatory Visit (HOSPITAL_COMMUNITY): Payer: BC Managed Care – PPO

## 2019-05-17 ENCOUNTER — Encounter (HOSPITAL_COMMUNITY): Payer: Self-pay

## 2019-05-17 ENCOUNTER — Ambulatory Visit (HOSPITAL_COMMUNITY): Admission: RE | Admit: 2019-05-17 | Payer: BC Managed Care – PPO | Source: Ambulatory Visit

## 2019-06-06 ENCOUNTER — Encounter: Payer: Self-pay | Admitting: Family Medicine

## 2019-06-06 ENCOUNTER — Ambulatory Visit (INDEPENDENT_AMBULATORY_CARE_PROVIDER_SITE_OTHER): Payer: Self-pay | Admitting: Family Medicine

## 2019-06-06 ENCOUNTER — Other Ambulatory Visit: Payer: Self-pay

## 2019-06-06 VITALS — BP 137/93 | HR 87 | Temp 97.5°F | Resp 17 | Ht 70.0 in | Wt 165.8 lb

## 2019-06-06 DIAGNOSIS — G4489 Other headache syndrome: Secondary | ICD-10-CM

## 2019-06-06 DIAGNOSIS — R7989 Other specified abnormal findings of blood chemistry: Secondary | ICD-10-CM | POA: Diagnosis not present

## 2019-06-06 DIAGNOSIS — Z1211 Encounter for screening for malignant neoplasm of colon: Secondary | ICD-10-CM

## 2019-06-06 DIAGNOSIS — I10 Essential (primary) hypertension: Secondary | ICD-10-CM

## 2019-06-06 DIAGNOSIS — G621 Alcoholic polyneuropathy: Secondary | ICD-10-CM

## 2019-06-06 MED ORDER — AMLODIPINE BESYLATE 10 MG PO TABS: 10.0000 mg | ORAL_TABLET | Freq: Every day | ORAL | 6 refills | Status: DC

## 2019-06-06 MED ORDER — LOSARTAN POTASSIUM 25 MG PO TABS: 25.0000 mg | ORAL_TABLET | Freq: Every day | ORAL | 6 refills | Status: DC

## 2019-06-06 MED ORDER — GABAPENTIN 300 MG PO CAPS: 300.0000 mg | ORAL_CAPSULE | Freq: Every day | ORAL | 6 refills | Status: DC

## 2019-06-06 NOTE — Patient Instructions (Signed)

## 2019-06-06 NOTE — Progress Notes (Signed)
Subjective:  Patient ID: Cameron Rogers, male    DOB: 05-09-1966  Age: 53 y.o. MRN: [id number]  CC: Hypertension   HPI Cameron Rogers is a 53 year old male with a history of Hypertension, alcohol abuse here for a follow up visit. He endorses compliance with his antihypertensive. Today he complains of cramping in his hands while working and his hands are sometimes numb and stiffen up.This is intermittent and he denies precipitating factors, denies swelling of his joints. He consumes one and a half 40oz bottle of beer/day.  He also has headaches with are intermittent and this occurs after he has been wearing his mask. No associated blurry vision, nausea or vomiting. .  With regards to health care maintenance he is due for a colonoscopy.  Past Medical History:  Diagnosis Date  . Hypertension     History reviewed. No pertinent surgical history.  History reviewed. No pertinent family history.  No Known Allergies  Outpatient Medications Prior to Visit  Medication Sig Dispense Refill  . Multiple Vitamins-Minerals (COMPLETE ENERGY) TABS Take 1 tablet by mouth daily.    [name] amLODipine (NORVASC) 10 MG tablet Take 1 tablet (10 mg total) by mouth daily. 90 tablet 3  . losartan (COZAAR) 25 MG tablet Take 1 tablet (25 mg total) by mouth daily. 30 tablet 1  . Cyanocobalamin (VITAMIN B-12 PO) Take by mouth.     No facility-administered medications prior to visit.      ROS Review of Systems  Constitutional: Negative for activity change and appetite change.  HENT: Negative for sinus pressure and sore throat.   Eyes: Negative for visual disturbance.  Respiratory: Negative for cough, chest tightness, shortness of breath and wheezing.   Cardiovascular: Negative for chest pain, palpitations and leg swelling.  Gastrointestinal: Negative for abdominal distention, abdominal pain, constipation and diarrhea.  Endocrine: Negative.   Genitourinary: Negative.  Negative for dysuria.   Musculoskeletal: Negative.  Negative for joint swelling and myalgias.  Skin: Negative for rash.  Allergic/Immunologic: Negative.   Neurological: Positive for headaches. Negative for weakness, light-headedness and numbness.  Psychiatric/Behavioral: Negative for behavioral problems, dysphoric mood and suicidal ideas.    Objective:  BP (!) 137/93   Pulse 87   Temp (!) 97.5 F (36.4 C) (Temporal)   Resp 17   Ht 5\' 10"  (1.778 m)   Wt 165 lb 12.8 oz (75.2 kg)   SpO2 98%   BMI 23.79 kg/m   BP/Weight 06/06/2019 04/26/2019 3/50/0938  Systolic BP 182 993 716  Diastolic BP 93 89 967  Wt. (Lbs) 165.8 165 171  BMI 23.79 21.18 21.96      Physical Exam Constitutional:      Appearance: He is well-developed.  Cardiovascular:     Rate and Rhythm: Normal rate.     Heart sounds: Normal heart sounds. No murmur.  Pulmonary:     Effort: Pulmonary effort is normal.     Breath sounds: Normal breath sounds. No wheezing or rales.  Chest:     Chest wall: No tenderness.  Abdominal:     General: Bowel sounds are normal. There is no distension.     Palpations: Abdomen is soft. There is no mass.     Tenderness: There is no abdominal tenderness.  Musculoskeletal: Normal range of motion.  Neurological:     Mental Status: He is alert and oriented to person, place, and time.     CMP Latest Ref Rng & Units 04/26/2019 04/02/2019 04/25/2017  Glucose 65 - 99 mg/dL 80  104(H) 83  BUN 6 - 24 mg/dL 13 5(L) 10  Creatinine 0.76 - 1.27 mg/dL 4.090.81 8.110.96 9.140.82  Sodium 134 - 144 mmol/L 139 130(L) 136  Potassium 3.5 - 5.2 mmol/L 4.3 3.6 4.0  Chloride 96 - 106 mmol/L 100 94(L) 100(L)  CO2 20 - 29 mmol/L 23 25 26   Calcium 8.7 - 10.2 mg/dL 9.4 9.1 9.3  Total Protein 6.0 - 8.5 g/dL 6.7 7.0 7.4  Total Bilirubin 0.0 - 1.2 mg/dL 0.6 7.8(G1.5(H) 0.9  Alkaline Phos 39 - 117 IU/L 101 130(H) 68  AST 0 - 40 IU/L 62(H) 179(H) 100(H)  ALT 0 - 44 IU/L 77(H) 110(H) 47    Lipid Panel     Component Value Date/Time   CHOL 135  04/26/2019 0948   TRIG 74 04/26/2019 0948   HDL 54 04/26/2019 0948   CHOLHDL 2.5 04/26/2019 0948   LDLCALC 66 04/26/2019 0948    CBC    Component Value Date/Time   WBC 7.8 04/26/2019 0948   WBC 6.4 04/02/2019 1025   RBC 4.42 04/26/2019 0948   RBC 5.11 04/02/2019 1025   HGB 13.8 04/26/2019 0948   HCT 41.2 04/26/2019 0948   PLT 162 04/26/2019 0948   MCV 93 04/26/2019 0948   MCH 31.2 04/26/2019 0948   MCH 30.9 04/02/2019 1025   MCHC 33.5 04/26/2019 0948   MCHC 35.2 04/02/2019 1025   RDW 13.5 04/26/2019 0948   LYMPHSABS 2.9 04/26/2019 0948   MONOABS 0.6 04/02/2019 1025   EOSABS 0.0 04/26/2019 0948   BASOSABS 0.0 04/26/2019 0948    Lab Results  Component Value Date   HGBA1C 5.9 (H) 04/26/2019    Assessment & Plan:   1. Essential hypertension Diastolic elevation No regimen change but encouraged on lifestyle modifications Counseled on blood pressure goal of less than 130/80, low-sodium, DASH diet, medication compliance, 150 minutes of moderate intensity exercise per week. Discussed medication compliance, adverse effects. - amLODipine (NORVASC) 10 MG tablet; Take 1 tablet (10 mg total) by mouth daily.  Dispense: 30 tablet; Refill: 6 - losartan (COZAAR) 25 MG tablet; Take 1 tablet (25 mg total) by mouth daily.  Dispense: 30 tablet; Refill: 6  2. Neuropathy, alcoholic (HCC) Counseled on alcohol cessation as this could explain his musculoskeletal complaints Will commence Gabapentin - gabapentin (NEURONTIN) 300 MG capsule; Take 1 capsule (300 mg total) by mouth at bedtime.  Dispense: 30 capsule; Refill: 6  3. Other headache syndrome Related to wearing of his mask Advised to take mask free breaks outside intermittently  4. Screening for colon cancer - Ambulatory referral to Gastroenterology   Health Care Maintenance: see #4 above Meds ordered this encounter  Medications  . gabapentin (NEURONTIN) 300 MG capsule    Sig: Take 1 capsule (300 mg total) by mouth at bedtime.     Dispense:  30 capsule    Refill:  6  . amLODipine (NORVASC) 10 MG tablet    Sig: Take 1 tablet (10 mg total) by mouth daily.    Dispense:  30 tablet    Refill:  6  . losartan (COZAAR) 25 MG tablet    Sig: Take 1 tablet (25 mg total) by mouth daily.    Dispense:  30 tablet    Refill:  6    Follow-up: Return in about 6 months (around 12/07/2019) for medical conditions.       Cameron RegisterEnobong Ruweyda Macknight, MD, FAAFP. [facility] [address] [place], [place] [phone]   06/06/2019, 9:34 AM

## 2019-06-06 NOTE — Progress Notes (Signed)
Here for BP follow up. Denies chest pain, SHOB, dizziness, lower extremity swelling. Has had some headaches.

## 2019-06-07 ENCOUNTER — Encounter: Payer: Self-pay | Admitting: Gastroenterology

## 2019-06-07 LAB — THYROID PANEL WITH TSH
Free Thyroxine Index: 1.3 (ref 1.2–4.9)
T3 Uptake Ratio: 32 % (ref 24–39)
T4, Total: 4.1 ug/dL — ABNORMAL LOW (ref 4.5–12.0)
TSH: 0.801 u[IU]/mL (ref 0.450–4.500)

## 2019-07-05 ENCOUNTER — Encounter: Payer: Self-pay | Admitting: Gastroenterology

## 2019-07-12 ENCOUNTER — Encounter: Payer: Self-pay | Admitting: Gastroenterology

## 2019-07-12 ENCOUNTER — Ambulatory Visit (AMBULATORY_SURGERY_CENTER): Payer: Self-pay | Admitting: *Deleted

## 2019-07-12 ENCOUNTER — Other Ambulatory Visit: Payer: Self-pay

## 2019-07-12 VITALS — Temp 97.2°F | Ht 74.0 in | Wt 175.4 lb

## 2019-07-12 DIAGNOSIS — Z1211 Encounter for screening for malignant neoplasm of colon: Secondary | ICD-10-CM

## 2019-07-12 MED ORDER — NA SULFATE-K SULFATE-MG SULF 17.5-3.13-1.6 GM/177ML PO SOLN: 1.0000 | Freq: Once | ORAL | 0 refills | Status: AC

## 2019-07-12 NOTE — Progress Notes (Signed)
No egg or soy allergy known to patient  No issues with past sedation with any surgeries  or procedures, no intubation problems  No diet pills per patient No home 02 use per patient  No blood thinners per patient  Pt denies issues with constipation  No A fib or A flutter  EMMI video sent to pt's e mail   Due to the COVID-19 pandemic we are asking patients to follow these guidelines. Please only bring one care partner. Please be aware that your care partner may wait in the car in the parking lot or if they feel like they will be too hot to wait in the car, they may wait in the lobby on the 4th floor. All care partners are required to wear a mask the entire time (we do not have any that we can provide them), they need to practice social distancing, and we will do a Covid check for all patient's and care partners when you arrive. Also we will check their temperature and your temperature. If the care partner waits in their car they need to stay in the parking lot the entire time and we will call them on their cell phone when the patient is ready for discharge so they can bring the car to the front of the building. Also all patient's will need to wear a mask into building.  suprep coupon provided. 

## 2019-07-25 ENCOUNTER — Telehealth: Payer: Self-pay

## 2019-07-25 NOTE — Telephone Encounter (Signed)
Covid-19 screening questions   Do you now or have you had a fever in the last 14 days?  Do you have any respiratory symptoms of shortness of breath or cough now or in the last 14 days?  Do you have any family members or close contacts with diagnosed or suspected Covid-19 in the past 14 days?  Have you been tested for Covid-19 and found to be positive?       

## 2019-07-26 ENCOUNTER — Telehealth: Payer: Self-pay | Admitting: Family Medicine

## 2019-07-26 ENCOUNTER — Encounter: Payer: Self-pay | Admitting: Gastroenterology

## 2019-07-26 NOTE — Telephone Encounter (Signed)
Prescription was sent to pharmacy on 06/06/2019, #30 with 6 refills. Patient needs to call pharmacy to get refill ready for pick up. He can also ask them to put prescriptions on autofill.

## 2019-07-26 NOTE — Telephone Encounter (Signed)
1) Medication(s) Requested (by name): amLODipine (NORVASC) 10 MG tablet [[id number]]   2) Pharmacy of Choice: [facility], [place]    Approved medications will be sent to pharmacy, we will reach out to you if there is an issue.  Requests made after 3pm may not be addressed until following business day!

## 2019-07-30 ENCOUNTER — Telehealth: Payer: Self-pay | Admitting: *Deleted

## 2019-07-30 NOTE — Telephone Encounter (Signed)
  Follow up Call-  No flowsheet data found. [phone]  Patient questions:  Message left to call us if necessary.

## 2019-11-28 ENCOUNTER — Encounter: Payer: Self-pay | Admitting: Internal Medicine

## 2019-11-28 ENCOUNTER — Ambulatory Visit (INDEPENDENT_AMBULATORY_CARE_PROVIDER_SITE_OTHER): Payer: BC Managed Care – PPO | Admitting: Internal Medicine

## 2019-11-28 DIAGNOSIS — R7989 Other specified abnormal findings of blood chemistry: Secondary | ICD-10-CM

## 2019-11-28 DIAGNOSIS — G621 Alcoholic polyneuropathy: Secondary | ICD-10-CM

## 2019-11-28 DIAGNOSIS — R972 Elevated prostate specific antigen [PSA]: Secondary | ICD-10-CM | POA: Insufficient documentation

## 2019-11-28 DIAGNOSIS — R7303 Prediabetes: Secondary | ICD-10-CM | POA: Diagnosis not present

## 2019-11-28 DIAGNOSIS — I10 Essential (primary) hypertension: Secondary | ICD-10-CM | POA: Insufficient documentation

## 2019-11-28 DIAGNOSIS — F1010 Alcohol abuse, uncomplicated: Secondary | ICD-10-CM | POA: Insufficient documentation

## 2019-11-28 NOTE — Progress Notes (Signed)
Virtual Visit via Telephone Note  I connected with Cameron Rogers, on 11/28/2019 at 8:35 AM by telephone due to the COVID-19 pandemic and verified that I am speaking with the correct person using two identifiers.   Consent: I discussed the limitations, risks, security and privacy concerns of performing an evaluation and management service by telephone and the availability of in person appointments. I also discussed with the patient that there may be a patient responsible charge related to this service. The patient expressed understanding and agreed to proceed.   Location of Patient: Home   Location of Provider: Clinic    Persons participating in Telemedicine visit: Digby [facility] Dr. Earlene Plater      History of Present Illness: Patient has a visit to f/u HTN.   Chronic HTN Disease Monitoring:  Home BP Monitoring - Does not check at home.  Chest pain- no  Dyspnea- no Headache - no  Medications: Amlodipine 10 mg, Losartan 25 mg  Compliance- yes Lightheadedness- no  Edema- no   Preventitive Healthcare:  Exercise:  No specific exercise. 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   Diet Pattern: Says he eats fruits and lots of baked chicken.   Salt Restriction: Yes       Past Medical History:  Diagnosis Date  . Allergy    SEASONAL  . Arthritis   . Heart murmur   . Hypertension    No Known Allergies  Current Outpatient Medications on File Prior to Visit  Medication Sig Dispense Refill  . amLODipine (NORVASC) 10 MG tablet Take 1 tablet (10 mg total) by mouth daily. 30 tablet 6  . Cyanocobalamin (VITAMIN B12 PO) Take 1 tablet by mouth every other day.     . gabapentin (NEURONTIN) 300 MG capsule Take 1 capsule (300 mg total) by mouth at bedtime. 30 capsule 6  . losartan (COZAAR) 25 MG tablet Take 1 tablet (25 mg total) by mouth daily. 30 tablet 6  .  Multiple Vitamins-Minerals (COMPLETE ENERGY) TABS Take 1 tablet by mouth daily.    . [DISCONTINUED] hydrochlorothiazide (HYDRODIURIL) 25 MG tablet Take 1 tablet (25 mg total) by mouth daily. 90 tablet 1  . [DISCONTINUED] omeprazole (PRILOSEC) 20 MG capsule Take 1 capsule (20 mg total) by mouth daily. 10 capsule 0  . [DISCONTINUED] promethazine (PHENERGAN) 25 MG tablet Take 1 tablet (25 mg total) by mouth every 8 (eight) hours as needed for nausea or vomiting. 8 tablet 0   No current facility-administered medications on file prior to visit.    Observations/Objective: NAD. Speaking clearly.  Work of breathing normal.  Alert and oriented. Mood appropriate.   Assessment and Plan: 1. Essential hypertension Unable to assess if BP at goal or not. Continue current therapy. Will need to come in for visit for BP check.   2. Neuropathy, alcoholic (HCC) Continue Gabapentin.   3. Elevated liver function tests Noted that patient previously had elevated LFTs with a liver ultrasound ordered. Patient never completed ultrasound. Will need to have this rescheduled.   4. Prediabetes Patient falls in prediabetic range. Counseled on adhering to a carb modified diet and trying to increase aerobic exercise. Would benefit from repeat A1c.    Follow Up Instructions: Patient to schedule an in person visit for chronic medical problems    I discussed the assessment and treatment plan with the patient. The patient was provided an opportunity to ask  questions and all were answered. The patient agreed with the plan and demonstrated an understanding of the instructions.   The patient was advised to call back or seek an in-person evaluation if the symptoms worsen or if the condition fails to improve as anticipated.     I provided 14 minutes total of non-face-to-face time during this encounter including median intraservice time, reviewing previous notes, investigations, ordering medications, medical decision  making, coordinating care and patient verbalized understanding at the end of the visit.    Phill Myron, D.O. Primary Care at [facility]  11/28/2019, 8:35 AM

## 2020-02-14 ENCOUNTER — Other Ambulatory Visit: Payer: Self-pay | Admitting: Family Medicine

## 2020-02-14 DIAGNOSIS — I10 Essential (primary) hypertension: Secondary | ICD-10-CM

## 2020-02-17 MED ORDER — AMLODIPINE BESYLATE 10 MG PO TABS: 10.0000 mg | ORAL_TABLET | Freq: Every day | ORAL | 0 refills | Status: DC

## 2020-02-26 ENCOUNTER — Inpatient Hospital Stay (HOSPITAL_COMMUNITY)
Admission: EM | Admit: 2020-02-26 | Discharge: 2020-03-01 | DRG: 177 | Disposition: A | Discharge status: Home / Self Care | Payer: BC Managed Care – PPO | Attending: Internal Medicine | Admitting: Internal Medicine

## 2020-02-26 ENCOUNTER — Emergency Department (HOSPITAL_COMMUNITY): Payer: BC Managed Care – PPO

## 2020-02-26 ENCOUNTER — Observation Stay (HOSPITAL_COMMUNITY): Payer: BC Managed Care – PPO

## 2020-02-26 ENCOUNTER — Other Ambulatory Visit: Payer: Self-pay

## 2020-02-26 ENCOUNTER — Encounter (HOSPITAL_COMMUNITY): Payer: Self-pay | Admitting: Internal Medicine

## 2020-02-26 DIAGNOSIS — R000 Tachycardia, unspecified: Secondary | ICD-10-CM | POA: Diagnosis not present

## 2020-02-26 DIAGNOSIS — R531 Weakness: Secondary | ICD-10-CM | POA: Diagnosis not present

## 2020-02-26 DIAGNOSIS — D696 Thrombocytopenia, unspecified: Secondary | ICD-10-CM

## 2020-02-26 DIAGNOSIS — K625 Hemorrhage of anus and rectum: Secondary | ICD-10-CM | POA: Diagnosis not present

## 2020-02-26 DIAGNOSIS — Z79899 Other long term (current) drug therapy: Secondary | ICD-10-CM | POA: Diagnosis not present

## 2020-02-26 DIAGNOSIS — D649 Anemia, unspecified: Secondary | ICD-10-CM | POA: Diagnosis present

## 2020-02-26 DIAGNOSIS — J9601 Acute respiratory failure with hypoxia: Secondary | ICD-10-CM | POA: Diagnosis present

## 2020-02-26 DIAGNOSIS — R7303 Prediabetes: Secondary | ICD-10-CM | POA: Diagnosis not present

## 2020-02-26 DIAGNOSIS — R7989 Other specified abnormal findings of blood chemistry: Secondary | ICD-10-CM | POA: Diagnosis present

## 2020-02-26 DIAGNOSIS — J8410 Pulmonary fibrosis, unspecified: Secondary | ICD-10-CM | POA: Diagnosis not present

## 2020-02-26 DIAGNOSIS — J1282 Pneumonia due to coronavirus disease 2019: Secondary | ICD-10-CM | POA: Diagnosis present

## 2020-02-26 DIAGNOSIS — R945 Abnormal results of liver function studies: Secondary | ICD-10-CM | POA: Diagnosis not present

## 2020-02-26 DIAGNOSIS — D6959 Other secondary thrombocytopenia: Secondary | ICD-10-CM | POA: Diagnosis present

## 2020-02-26 DIAGNOSIS — K922 Gastrointestinal hemorrhage, unspecified: Secondary | ICD-10-CM | POA: Diagnosis present

## 2020-02-26 DIAGNOSIS — I10 Essential (primary) hypertension: Secondary | ICD-10-CM | POA: Diagnosis not present

## 2020-02-26 DIAGNOSIS — F17210 Nicotine dependence, cigarettes, uncomplicated: Secondary | ICD-10-CM | POA: Diagnosis present

## 2020-02-26 DIAGNOSIS — Z803 Family history of malignant neoplasm of breast: Secondary | ICD-10-CM | POA: Diagnosis not present

## 2020-02-26 DIAGNOSIS — R42 Dizziness and giddiness: Secondary | ICD-10-CM | POA: Diagnosis not present

## 2020-02-26 DIAGNOSIS — F1010 Alcohol abuse, uncomplicated: Secondary | ICD-10-CM | POA: Diagnosis not present

## 2020-02-26 DIAGNOSIS — R195 Other fecal abnormalities: Secondary | ICD-10-CM | POA: Diagnosis not present

## 2020-02-26 DIAGNOSIS — U071 COVID-19: Secondary | ICD-10-CM | POA: Diagnosis not present

## 2020-02-26 DIAGNOSIS — I499 Cardiac arrhythmia, unspecified: Secondary | ICD-10-CM | POA: Diagnosis not present

## 2020-02-26 DIAGNOSIS — R0902 Hypoxemia: Secondary | ICD-10-CM | POA: Diagnosis not present

## 2020-02-26 DIAGNOSIS — R972 Elevated prostate specific antigen [PSA]: Secondary | ICD-10-CM | POA: Diagnosis present

## 2020-02-26 LAB — MAGNESIUM: Magnesium: 2 mg/dL (ref 1.7–2.4)

## 2020-02-26 LAB — COMPREHENSIVE METABOLIC PANEL
ALT: 85 U/L — ABNORMAL HIGH (ref 0–44)
AST: 204 U/L — ABNORMAL HIGH (ref 15–41)
Albumin: 2.7 g/dL — ABNORMAL LOW (ref 3.5–5.0)
Alkaline Phosphatase: 130 U/L — ABNORMAL HIGH (ref 38–126)
Anion gap: 12 (ref 5–15)
BUN: 11 mg/dL (ref 6–20)
CO2: 21 mmol/L — ABNORMAL LOW (ref 22–32)
Calcium: 8.5 mg/dL — ABNORMAL LOW (ref 8.9–10.3)
Chloride: 96 mmol/L — ABNORMAL LOW (ref 98–111)
Creatinine, Ser: 1.14 mg/dL (ref 0.61–1.24)
GFR calc Af Amer: >60 mL/min (ref >60)
GFR calc non Af Amer: >60 mL/min (ref >60)
Glucose, Bld: 104 mg/dL — ABNORMAL HIGH (ref 70–99)
Potassium: 3.6 mmol/L (ref 3.5–5.1)
Sodium: 129 mmol/L — ABNORMAL LOW (ref 135–145)
Total Bilirubin: 1.4 mg/dL — ABNORMAL HIGH (ref 0.3–1.2)
Total Protein: 6.7 g/dL (ref 6.5–8.1)

## 2020-02-26 LAB — CBC WITH DIFFERENTIAL/PLATELET
Abs Immature Granulocytes: 0.02 10*3/uL (ref 0.00–0.07)
Basophils Absolute: 0 10*3/uL (ref 0.0–0.1)
Basophils Relative: 0 %
Eosinophils Absolute: 0 10*3/uL (ref 0.0–0.5)
Eosinophils Relative: 0 %
HCT: 33.8 % — ABNORMAL LOW (ref 39.0–52.0)
Hemoglobin: 11.6 g/dL — ABNORMAL LOW (ref 13.0–17.0)
Immature Granulocytes: 0 %
Lymphocytes Relative: 21 %
Lymphs Abs: 1.2 10*3/uL (ref 0.7–4.0)
MCH: 30.1 pg (ref 26.0–34.0)
MCHC: 34.3 g/dL (ref 30.0–36.0)
MCV: 87.6 fL (ref 80.0–100.0)
Monocytes Absolute: 0.6 10*3/uL (ref 0.1–1.0)
Monocytes Relative: 11 %
Neutro Abs: 3.9 10*3/uL (ref 1.7–7.7)
Neutrophils Relative %: 68 %
Platelets: 132 10*3/uL — ABNORMAL LOW (ref 150–400)
RBC: 3.86 MIL/uL — ABNORMAL LOW (ref 4.22–5.81)
RDW: 14.9 % (ref 11.5–15.5)
WBC: 5.7 10*3/uL (ref 4.0–10.5)
nRBC: 0 % (ref 0.0–0.2)

## 2020-02-26 LAB — RAPID URINE DRUG SCREEN, HOSP PERFORMED
Amphetamines: NOT DETECTED
Barbiturates: NOT DETECTED
Benzodiazepines: NOT DETECTED
Cocaine: NOT DETECTED
Opiates: NOT DETECTED
Tetrahydrocannabinol: NOT DETECTED

## 2020-02-26 LAB — PROCALCITONIN: Procalcitonin: 0.69 ng/mL

## 2020-02-26 LAB — PROTIME-INR
INR: 1.1 (ref 0.8–1.2)
Prothrombin Time: 13.4 seconds (ref 11.4–15.2)

## 2020-02-26 LAB — URINALYSIS, ROUTINE W REFLEX MICROSCOPIC
Bacteria, UA: NONE SEEN
Bilirubin Urine: NEGATIVE
Glucose, UA: NEGATIVE mg/dL
Ketones, ur: NEGATIVE mg/dL
Leukocytes,Ua: NEGATIVE
Nitrite: NEGATIVE
Protein, ur: NEGATIVE mg/dL
Specific Gravity, Urine: 1.009 (ref 1.005–1.030)
pH: 6 (ref 5.0–8.0)

## 2020-02-26 LAB — CBC
HCT: 34 % — ABNORMAL LOW (ref 39.0–52.0)
Hemoglobin: 11.5 g/dL — ABNORMAL LOW (ref 13.0–17.0)
MCH: 29.7 pg (ref 26.0–34.0)
MCHC: 33.8 g/dL (ref 30.0–36.0)
MCV: 87.9 fL (ref 80.0–100.0)
Platelets: ADEQUATE 10*3/uL (ref 150–400)
RBC: 3.87 MIL/uL — ABNORMAL LOW (ref 4.22–5.81)
RDW: 15.1 % (ref 11.5–15.5)
WBC: 5.7 10*3/uL (ref 4.0–10.5)
nRBC: 0 % (ref 0.0–0.2)

## 2020-02-26 LAB — TYPE AND SCREEN
ABO/RH(D): O POS
Antibody Screen: NEGATIVE

## 2020-02-26 LAB — ABO/RH: ABO/RH(D): O POS

## 2020-02-26 LAB — SARS CORONAVIRUS 2 BY RT PCR (HOSPITAL ORDER, PERFORMED IN ~~LOC~~ HOSPITAL LAB): SARS Coronavirus 2: POSITIVE — AB

## 2020-02-26 LAB — C-REACTIVE PROTEIN: CRP: 3.6 mg/dL — ABNORMAL HIGH (ref <1.0)

## 2020-02-26 LAB — POC OCCULT BLOOD, ED: Fecal Occult Bld: POSITIVE — AB

## 2020-02-26 LAB — LACTATE DEHYDROGENASE: LDH: 382 U/L — ABNORMAL HIGH (ref 98–192)

## 2020-02-26 LAB — FERRITIN: Ferritin: 711 ng/mL — ABNORMAL HIGH (ref 24–336)

## 2020-02-26 LAB — FIBRINOGEN: Fibrinogen: 654 mg/dL — ABNORMAL HIGH (ref 210–475)

## 2020-02-26 LAB — TROPONIN I (HIGH SENSITIVITY)
Troponin I (High Sensitivity): 22 ng/L — ABNORMAL HIGH (ref <18)
Troponin I (High Sensitivity): 20 ng/L — ABNORMAL HIGH (ref <18)

## 2020-02-26 LAB — ETHANOL: Alcohol, Ethyl (B): <10 mg/dL (ref <10)

## 2020-02-26 LAB — D-DIMER, QUANTITATIVE: D-Dimer, Quant: 0.56 ug/mL-FEU — ABNORMAL HIGH (ref 0.00–0.50)

## 2020-02-26 MED ORDER — SODIUM CHLORIDE 0.9 % IV SOLN
200.0000 mg | Freq: Once | INTRAVENOUS | Status: AC
  Administered 2020-02-26: 200 mg via INTRAVENOUS
  Filled 2020-02-26: qty 40

## 2020-02-26 MED ORDER — ONDANSETRON HCL 4 MG/2ML IJ SOLN: 4.0000 mg | Freq: Four times a day (QID) | INTRAMUSCULAR | Status: DC | PRN

## 2020-02-26 MED ORDER — THIAMINE HCL 100 MG/ML IJ SOLN
100.0000 mg | Freq: Every day | INTRAMUSCULAR | Status: DC
  Filled 2020-02-26: qty 2

## 2020-02-26 MED ORDER — SODIUM CHLORIDE 0.9 % IV SOLN
8.0000 mg/h | INTRAVENOUS | Status: DC
  Administered 2020-02-26: 8 mg/h via INTRAVENOUS
  Filled 2020-02-26: qty 80

## 2020-02-26 MED ORDER — SODIUM CHLORIDE 0.9 % IV SOLN
8.0000 mg/h | INTRAVENOUS | Status: DC
  Filled 2020-02-26: qty 80

## 2020-02-26 MED ORDER — SODIUM CHLORIDE 0.9% FLUSH
3.0000 mL | Freq: Two times a day (BID) | INTRAVENOUS | Status: DC
  Administered 2020-02-26 (×2): 3 mL via INTRAVENOUS

## 2020-02-26 MED ORDER — SODIUM CHLORIDE 0.9 % IV SOLN
100.0000 mg | Freq: Every day | INTRAVENOUS | Status: AC
  Administered 2020-02-27 – 2020-03-01 (×4): 100 mg via INTRAVENOUS
  Filled 2020-02-26 (×4): qty 20

## 2020-02-26 MED ORDER — SODIUM CHLORIDE 0.9 % IV SOLN
80.0000 mg | Freq: Once | INTRAVENOUS | Status: AC
  Administered 2020-02-26: 80 mg via INTRAVENOUS
  Filled 2020-02-26: qty 80

## 2020-02-26 MED ORDER — LORAZEPAM 2 MG/ML IJ SOLN: 1.0000 mg | INTRAMUSCULAR | Status: DC | PRN

## 2020-02-26 MED ORDER — LORAZEPAM 1 MG PO TABS: 1.0000 mg | ORAL_TABLET | ORAL | Status: DC | PRN

## 2020-02-26 MED ORDER — ADULT MULTIVITAMIN W/MINERALS CH
1.0000 | ORAL_TABLET | Freq: Every day | ORAL | Status: DC
  Administered 2020-02-26 – 2020-03-01 (×5): 1 via ORAL
  Filled 2020-02-26 (×5): qty 1

## 2020-02-26 MED ORDER — DEXAMETHASONE SODIUM PHOSPHATE 10 MG/ML IJ SOLN
6.0000 mg | INTRAMUSCULAR | Status: DC
  Administered 2020-02-26 – 2020-02-29 (×4): 6 mg via INTRAVENOUS
  Filled 2020-02-26 (×4): qty 1

## 2020-02-26 MED ORDER — ONDANSETRON HCL 4 MG PO TABS: 4.0000 mg | ORAL_TABLET | Freq: Four times a day (QID) | ORAL | Status: DC | PRN

## 2020-02-26 MED ORDER — GABAPENTIN 300 MG PO CAPS
300.0000 mg | ORAL_CAPSULE | Freq: Every day | ORAL | Status: DC
  Administered 2020-02-26 – 2020-02-29 (×4): 300 mg via ORAL
  Filled 2020-02-26 (×4): qty 1

## 2020-02-26 MED ORDER — PANTOPRAZOLE SODIUM 40 MG IV SOLR: 40.0000 mg | Freq: Two times a day (BID) | INTRAVENOUS | Status: DC

## 2020-02-26 MED ORDER — SODIUM CHLORIDE 0.9% FLUSH
3.0000 mL | Freq: Two times a day (BID) | INTRAVENOUS | Status: DC
  Administered 2020-02-26 – 2020-03-01 (×9): 3 mL via INTRAVENOUS

## 2020-02-26 MED ORDER — SODIUM CHLORIDE 0.9 % IV BOLUS
1000.0000 mL | Freq: Once | INTRAVENOUS | Status: AC
  Administered 2020-02-26: 10:00:00 1000 mL via INTRAVENOUS

## 2020-02-26 MED ORDER — AMLODIPINE BESYLATE 10 MG PO TABS
10.0000 mg | ORAL_TABLET | Freq: Every day | ORAL | Status: DC
  Administered 2020-02-27 – 2020-03-01 (×4): 10 mg via ORAL
  Filled 2020-02-26 (×4): qty 1

## 2020-02-26 MED ORDER — NICOTINE 14 MG/24HR TD PT24
14.0000 mg | MEDICATED_PATCH | Freq: Every day | TRANSDERMAL | Status: DC
  Administered 2020-02-26 – 2020-02-29 (×4): 14 mg via TRANSDERMAL
  Filled 2020-02-26 (×5): qty 1

## 2020-02-26 MED ORDER — ACETAMINOPHEN 325 MG PO TABS: 650.0000 mg | ORAL_TABLET | Freq: Four times a day (QID) | ORAL | Status: DC | PRN

## 2020-02-26 MED ORDER — HYDRALAZINE HCL 20 MG/ML IJ SOLN: 5.0000 mg | INTRAMUSCULAR | Status: DC | PRN

## 2020-02-26 MED ORDER — ACETAMINOPHEN 650 MG RE SUPP: 650.0000 mg | Freq: Four times a day (QID) | RECTAL | Status: DC | PRN

## 2020-02-26 MED ORDER — LACTATED RINGERS IV SOLN: INTRAVENOUS | Status: DC

## 2020-02-26 MED ORDER — FOLIC ACID 1 MG PO TABS
1.0000 mg | ORAL_TABLET | Freq: Every day | ORAL | Status: DC
  Administered 2020-02-26 – 2020-03-01 (×5): 1 mg via ORAL
  Filled 2020-02-26 (×5): qty 1

## 2020-02-26 MED ORDER — PANTOPRAZOLE SODIUM 40 MG PO TBEC
40.0000 mg | DELAYED_RELEASE_TABLET | Freq: Every day | ORAL | Status: DC
  Administered 2020-02-26 – 2020-03-01 (×5): 40 mg via ORAL
  Filled 2020-02-26 (×5): qty 1

## 2020-02-26 MED ORDER — THIAMINE HCL 100 MG PO TABS
100.0000 mg | ORAL_TABLET | Freq: Every day | ORAL | Status: DC
  Administered 2020-02-26 – 2020-03-01 (×5): 100 mg via ORAL
  Filled 2020-02-26 (×7): qty 1

## 2020-02-26 NOTE — ED Notes (Signed)
Pt reports no dizziness with orthostatic VS.  Has repeated twice that he works too much and needs to sleep.  No other noted sx at this time.

## 2020-02-26 NOTE — ED Provider Notes (Addendum)
[facility] EMERGENCY DEPARTMENT Provider Note   CSN: [id number] Arrival date & time: 02/26/20  0973     History Chief Complaint  Patient presents with  . Dizziness  . Weakness  . Nausea  . Urinary Frequency    Cameron Rogers is a 54 y.o. male.  Pt presents to the ED today with weakness.  Pt said he's been feeling like he's been urinating a lot.  He's had nausea and some diarrhea.  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  He said he runs all day.  No recent med changes.  Pt has not had the Covid vaccine.        Past Medical History:  Diagnosis Date  . Allergy    SEASONAL  . Arthritis   . Heart murmur   . Hypertension     Patient Active Problem List   Diagnosis Date Noted  . Acute upper GI bleed 02/26/2020  . Essential hypertension 11/28/2019  . Neuropathy, alcoholic (HCC) 11/28/2019  . Elevated PSA 11/28/2019  . Elevated liver function tests 11/28/2019  . Prediabetes 11/28/2019  . Alcohol abuse 11/28/2019    Past Surgical History:  Procedure Laterality Date  . NO PAST SURGERIES         Family History  Problem Relation Age of Onset  . Breast cancer Sister   . Colon cancer Neg Hx   . Colon polyps Neg Hx   . Esophageal cancer Neg Hx   . Rectal cancer Neg Hx   . Stomach cancer Neg Hx     Social History   Tobacco Use  . Smoking status: Current Every Day Smoker    Packs/day: 0.50    Years: 38.00    Pack years: 19.00    Types: Cigarettes  . Smokeless tobacco: Never Used  Substance Use Topics  . Alcohol use: Yes    Alcohol/week: 2.0 standard drinks    Types: 2 Cans of beer per week    Comment: 1 24 ounce can every 2 years; h/o heavy use, decreased use about a year ago  . Drug use: No    Home Medications Prior to Admission medications   Medication Sig Start Date End Date Taking? Authorizing Provider  amLODipine (NORVASC) 10 MG tablet Take 1 tablet (10 mg total) by mouth daily.  02/17/20  Yes [name], DO  gabapentin (NEURONTIN) 300 MG capsule Take 1 capsule (300 mg total) by mouth at bedtime. 06/06/19  Yes [name], MD  losartan (COZAAR) 25 MG tablet Take 1 tablet by mouth once daily 02/17/20  Yes [name], DO  hydrochlorothiazide (HYDRODIURIL) 25 MG tablet Take 1 tablet (25 mg total) by mouth daily. 06/02/16 04/02/19  [name], PA-C  omeprazole (PRILOSEC) 20 MG capsule Take 1 capsule (20 mg total) by mouth daily. 04/25/17 04/02/19  [name], MD  promethazine (PHENERGAN) 25 MG tablet Take 1 tablet (25 mg total) by mouth every 8 (eight) hours as needed for nausea or vomiting. 04/25/17 04/02/19  [name], MD    Allergies    Patient has no known allergies.  Review of Systems   Review of Systems  Gastrointestinal: Positive for nausea.  Neurological: Positive for dizziness and weakness.  All other systems reviewed and are negative.   Physical Exam Updated Vital Signs BP 122/70   Pulse 78   Temp 98.5 F (36.9 C) (Oral)   Resp 17   Ht 6\' 2"  (  1.88 m)   Wt 86.2 kg   SpO2 99%   BMI 24.39 kg/m   Physical Exam Vitals and nursing note reviewed.  Constitutional:      Appearance: Normal appearance.  HENT:     Head: Normocephalic and atraumatic.     Right Ear: External ear normal.     Left Ear: External ear normal.     Nose: Nose normal.     Mouth/Throat:     Mouth: Mucous membranes are dry.  Eyes:     Extraocular Movements: Extraocular movements intact.     Conjunctiva/sclera: Conjunctivae normal.     Pupils: Pupils are equal, round, and reactive to light.  Cardiovascular:     Rate and Rhythm: Normal rate and regular rhythm.     Pulses: Normal pulses.     Heart sounds: Normal heart sounds.  Pulmonary:     Effort: Pulmonary effort is normal.     Breath sounds: Normal breath sounds.  Abdominal:     General: Abdomen is flat. Bowel sounds are normal.     Palpations: Abdomen is soft.    Musculoskeletal:        General: Normal range of motion.     Cervical back: Normal range of motion and neck supple.  Skin:    General: Skin is warm.     Capillary Refill: Capillary refill takes less than 2 seconds.  Neurological:     General: No focal deficit present.     Mental Status: He is alert and oriented to person, place, and time.  Psychiatric:        Mood and Affect: Mood normal.        Behavior: Behavior normal.        Thought Content: Thought content normal.        Judgment: Judgment normal.     ED Results / Procedures / Treatments   Labs (all labs ordered are listed, but only abnormal results are displayed) Labs Reviewed  SARS CORONAVIRUS 2 BY RT PCR (HOSPITAL ORDER, PERFORMED IN [facility]) - Abnormal; Notable for the following components:      Result Value   SARS Coronavirus 2 POSITIVE (*)    All other components within normal limits  COMPREHENSIVE METABOLIC PANEL - Abnormal; Notable for the following components:   Sodium 129 (*)    Chloride 96 (*)    CO2 21 (*)    Glucose, Bld 104 (*)    Calcium 8.5 (*)    Albumin 2.7 (*)    AST 204 (*)    ALT 85 (*)    Alkaline Phosphatase 130 (*)    Total Bilirubin 1.4 (*)    All other components within normal limits  CBC WITH DIFFERENTIAL/PLATELET - Abnormal; Notable for the following components:   RBC 3.86 (*)    Hemoglobin 11.6 (*)    HCT 33.8 (*)    Platelets 132 (*)    All other components within normal limits  URINALYSIS, ROUTINE W REFLEX MICROSCOPIC - Abnormal; Notable for the following components:   Hgb urine dipstick SMALL (*)    All other components within normal limits  POC OCCULT BLOOD, ED - Abnormal; Notable for the following components:   Fecal Occult Bld POSITIVE (*)    All other components within normal limits  TROPONIN I (HIGH SENSITIVITY) - Abnormal; Notable for the following components:   Troponin I (High Sensitivity) 22 (*)    All other components within normal limits  TROPONIN I  (HIGH SENSITIVITY) -  Abnormal; Notable for the following components:   Troponin I (High Sensitivity) 20 (*)    All other components within normal limits  MAGNESIUM  ETHANOL  RAPID URINE DRUG SCREEN, HOSP PERFORMED  PROTIME-INR  CBC  TYPE AND SCREEN  ABO/RH    EKG EKG Interpretation  Date/Time:  Wednesday Feb 26 2020 09:39:12 EDT Ventricular Rate:  80 PR Interval:    QRS Duration: 113 QT Interval:  370 QTC Calculation: 427 R Axis:   40 Text Interpretation: Sinus rhythm Consider left atrial enlargement Incomplete right bundle branch block Anteroseptal infarct, old No significant change since last tracing Confirmed by Jacalyn Lefevre [phone]) on 02/26/2020 10:17:09 AM   Radiology DG Chest 2 View  Result Date: 02/26/2020 CLINICAL DATA:  Weakness and dizziness EXAM: CHEST - 2 VIEW COMPARISON:  January 30, 2016 FINDINGS: There is a calcified granuloma in the left mid lung. The lungs elsewhere are clear. The heart size and pulmonary vascularity are normal. No adenopathy. No pneumothorax. No bone lesions. IMPRESSION: Calcified granuloma left mid lung. Lungs elsewhere clear. Cardiac silhouette within normal limits. Electronically Signed   By: Bretta Bang III M.D.   On: 02/26/2020 10:51   CT Head Wo Contrast  Result Date: 02/26/2020 CLINICAL DATA:  Vertigo, peripheral. Additional history provided: Sudden onset of dizziness, blurred vision earlier today, symptoms lasted for approximately 30-45 seconds, now improved but not resolved. EXAM: CT HEAD WITHOUT CONTRAST TECHNIQUE: Contiguous axial images were obtained from the base of the skull through the vertex without intravenous contrast. COMPARISON:  No pertinent prior studies available for comparison. FINDINGS: Brain: There is no acute intracranial hemorrhage. No demarcated cortical infarct. No extra-axial fluid collection. No evidence of intracranial mass. No midline shift. Vascular: No hyperdense vessel. Skull: Normal. Negative for fracture  or focal lesion. Sinuses/Orbits: Visualized orbits show no acute finding. Air-fluid levels within the visualized bilateral maxillary sinuses. Mucosal thickening and scattered secretions within the bilateral ethmoid air cells. No significant mastoid effusion. IMPRESSION: 1. No evidence of acute intracranial abnormality. 2. Paranasal sinus disease as described. Correlate for acute sinusitis. Electronically Signed   By: Jackey Loge DO   On: 02/26/2020 11:41    Procedures Procedures (including critical care time)  Medications Ordered in ED Medications  pantoprazole (PROTONIX) 80 mg in sodium chloride 0.9 % 100 mL (0.8 mg/mL) infusion (has no administration in time range)  pantoprazole (PROTONIX) injection 40 mg (has no administration in time range)  amLODipine (NORVASC) tablet 10 mg (has no administration in time range)  gabapentin (NEURONTIN) capsule 300 mg (has no administration in time range)  sodium chloride flush (NS) 0.9 % injection 3 mL (has no administration in time range)  lactated ringers infusion (has no administration in time range)  acetaminophen (TYLENOL) tablet 650 mg (has no administration in time range)    Or  acetaminophen (TYLENOL) suppository 650 mg (has no administration in time range)  ondansetron (ZOFRAN) tablet 4 mg (has no administration in time range)    Or  ondansetron (ZOFRAN) injection 4 mg (has no administration in time range)  nicotine (NICODERM CQ - dosed in mg/24 hours) patch 14 mg (has no administration in time range)  hydrALAZINE (APRESOLINE) injection 5 mg (has no administration in time range)  sodium chloride 0.9 % bolus 1,000 mL (0 mLs Intravenous Stopped 02/26/20 1150)  pantoprazole (PROTONIX) 80 mg in sodium chloride 0.9 % 100 mL IVPB (0 mg Intravenous Stopped 02/26/20 1250)    ED Course  I have reviewed the triage vital signs and  the nursing notes.  Pertinent labs & imaging results that were available during my care of the patient were reviewed by me  and considered in my medical decision making (see chart for details).    MDM Rules/Calculators/A&P                      LFTs are elevated which they have been in the past.  His pcp ordered a liver US, but he never had that done.  Platelets are low which they have been in the past.  This is likely due to alcohol abuse.  Sodium is low, but he's on hctz.  Hemoglobin is down to 11.6.  In July, his Hgb was 13.8 and in June it was 15.8.  Upon further questioning, pt has had black stool in his diarrhea.  Pt's stool was guaiac +.  Pt d/w [facility] who will see pt in consult.  Pt d/w Dr. Lorin Mercy ([facility]) who will admit.  Pt's Covid test did come back positive as well.  Final Clinical Impression(s) / ED Diagnoses Final diagnoses:  Gastrointestinal hemorrhage, unspecified gastrointestinal hemorrhage type  Elevated LFTs  Anemia, unspecified type  Thrombocytopenia ([place])  Alcohol abuse  COVID-19 virus detected    Rx / DC Orders ED Discharge Orders    None       Isla Pence, MD 02/26/20 [place]    Isla Pence, MD 02/26/20 1431

## 2020-02-26 NOTE — Consult Note (Addendum)
[name] Consult: 12:25 PM 02/26/2020  LOS: 0 days    Referring Provider: Dr Particia Nearing in ED  Primary Care Physician:  [name], DO Primary Gastroenterologist:  none     Reason for Consultation: Symptomatic anemia.   HPI: Cameron Rogers is a 54 y.o. male.  Alcohol abuse.  Hypertension. Elevated LFTs noted back in June 2020  Presented to the ED this morning for eval of weakness, dizziness that started in the morning.  Also complaining of nausea, polyuria. sxs started ~ 2 days ago.   Saw scant red blood per rectum last weeks after attempt to pass stool (no BM just blood w wiping).  This never happened before or since.  Appetite is good.  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  They are short staffed so he has been working long hours.  Hgb 11.6, MCV 87.  15.8 eleven months ago, 13.8 ten months ago Platelets 132, they were 133 eleven months ago.   Renal function normal.  T bili 1.4, alkaline phosphatase 130.  AST/ALT 204/85. High-sensitivity troponin 22. FOBT positive  Because he was complaining of vertigo, dizziness, blurred vision a CT of the head obtained which was unremarkable other than paranasal sinusitis  Social history.  Patient drinks beer.  Drinks a 24 ounce can about 3 times a week.  Years ago he drank more heavily but for many years the above is his usual pattern.  No history of IV drug abuse, crack cocaine, snorting drugs etc.  Smokes half a pack of cigarettes a day.  Lives alone.  Has supportive siblings.  Family history. One of his sisters had cancers of unclear type.  Past Medical History:  Diagnosis Date  . Allergy    SEASONAL  . Arthritis   . Heart murmur   . Hypertension     Past Surgical History:  Procedure Laterality Date  .  NO PAST SURGERIES      Prior to Admission medications   Medication Sig Start Date End Date Taking? Authorizing Provider  amLODipine (NORVASC) 10 MG tablet Take 1 tablet (10 mg total) by mouth daily. 02/17/20  Yes [name], DO  gabapentin (NEURONTIN) 300 MG capsule Take 1 capsule (300 mg total) by mouth at bedtime. 06/06/19  Yes [name], MD  losartan (COZAAR) 25 MG tablet Take 1 tablet by mouth once daily 02/17/20  Yes [name], DO  hydrochlorothiazide (HYDRODIURIL) 25 MG tablet Take 1 tablet (25 mg total) by mouth daily. 06/02/16 04/02/19  [name], PA-C  omeprazole (PRILOSEC) 20 MG capsule Take 1 capsule (20 mg total) by mouth daily. 04/25/17 04/02/19  [name], MD  promethazine (PHENERGAN) 25 MG tablet Take 1 tablet (25 mg total) by mouth every 8 (eight) hours as needed for nausea or vomiting. 04/25/17 04/02/19  [name], MD    Scheduled Meds:  Infusions: . pantoprozole (PROTONIX) infusion    . pantoprazole (PROTONIX) IVPB     PRN Meds:    Allergies as of 02/26/2020  . (No  Known Allergies)    Family History  Problem Relation Age of Onset  . Breast cancer Sister   . Colon cancer Neg Hx   . Colon polyps Neg Hx   . Esophageal cancer Neg Hx   . Rectal cancer Neg Hx   . Stomach cancer Neg Hx     Social History   Socioeconomic History  . Marital status: Single    Spouse name: Not on file  . Number of children: Not on file  . Years of education: Not on file  . Highest education level: Not on file  Occupational History  . Not on file  Tobacco Use  . Smoking status: Current Every Day Smoker    Packs/day: 0.50    Types: Cigarettes  . Smokeless tobacco: Never Used  Substance and Sexual Activity  . Alcohol use: Yes    Alcohol/week: 2.0 standard drinks    Types: 2 Cans of beer per week    Comment: daily  . Drug use: No  . Sexual activity: Not on file  Other Topics Concern  . Not on file  Social History  Narrative  . Not on file   Social Determinants of Health   Financial Resource Strain:   . Difficulty of Paying Living Expenses:   Food Insecurity:   . Worried About Charity fundraiser in the Last Year:   . Arboriculturist in the Last Year:   Transportation Needs:   . Film/video editor (Medical):   [name] Lack of Transportation (Non-Medical):   Physical Activity:   . Days of Exercise per Week:   . Minutes of Exercise per Session:   Stress:   . Feeling of Stress :   Social Connections:   . Frequency of Communication with Friends and Family:   . Frequency of Social Gatherings with Friends and Family:   . Attends Religious Services:   . Active Member of Clubs or Organizations:   . Attends Archivist Meetings:   [name] Marital Status:   Intimate Partner Violence:   . Fear of Current or Ex-Partner:   . Emotionally Abused:   [name] Physically Abused:   . Sexually Abused:     REVIEW OF SYSTEMS: Constitutional: Weakness, fatigue. ENT:  No nose bleeds Pulm: No dyspnea.  + Cough, nonproductive, no hemoptysis CV:  No palpitations, no LE edema.  GU:  No hematuria, no frequency GI: No dysphagia.  No anorexia.  No abdominal pain. Heme: Other than the single episode of scant blood per rectum last week, no unusual bleeding or bruising. Transfusions: None. Neuro:  No headaches, no peripheral tingling or numbness.  No syncope. Derm:  No itching, no rash or sores.  Endocrine:  No sweats or chills.  + polyuria.  No dysuria Immunization: Has not been vaccinated for Covid. Travel:  None beyond local counties in last few months.    PHYSICAL EXAM: Vital signs in last 24 hours: Vitals:   02/26/20 0937 02/26/20 1215  BP: 117/77 125/71  Pulse: 81 82  Resp: 17   Temp: 98.5 F (36.9 C)   SpO2: 100% 100%   Wt Readings from Last 3 Encounters:  02/26/20 86.2 kg  07/12/19 79.6 kg  06/06/19 75.2 kg    General: Pleasant, well-appearing, comfortable. Head: No facial asymmetry or  swelling.  No signs of head trauma. Eyes: No conjunctival pallor.  EOMI Ears: Not hard of hearing Nose: No congestion or discharge Mouth: Oropharynx moist, pink, clear.  Tongue midline.  Excellent teeth.  Neck: No JVD, no masses, thyromegaly. Lungs: Clear bilaterally without labored breathing or cough Heart: RRR. Abdomen: Soft without tenderness or distention.  Some firmness but no distinct liver margin on the left upper quadrant..   Rectal: Deferred. Musc/Skeltl: No joint redness, swelling or gross deformity. Extremities: No CCE. Neurologic: Oriented x3.  Moves all 4 limbs.  No weakness or tremors. Skin: No rash, no sores, no telangiectasia   Psych: Pleasant, cooperative, fluid speech.   Intake/Output from previous day: No intake/output data recorded. Intake/Output this shift: Total I/O In: 1000 [IV Piggyback:1000] Out: -   LAB RESULTS: Recent Labs    02/26/20 0938  WBC 5.7  HGB 11.6*  HCT 33.8*  PLT 132*   BMET Lab Results  Component Value Date   NA 129 (L) 02/26/2020   NA 139 04/26/2019   NA 130 (L) 04/02/2019   K 3.6 02/26/2020   K 4.3 04/26/2019   K 3.6 04/02/2019   CL 96 (L) 02/26/2020   CL 100 04/26/2019   CL 94 (L) 04/02/2019   CO2 21 (L) 02/26/2020   CO2 23 04/26/2019   CO2 25 04/02/2019   GLUCOSE 104 (H) 02/26/2020   GLUCOSE 80 04/26/2019   GLUCOSE 104 (H) 04/02/2019   BUN 11 02/26/2020   BUN 13 04/26/2019   BUN 5 (L) 04/02/2019   CREATININE 1.14 02/26/2020   CREATININE 0.81 04/26/2019   CREATININE 0.96 04/02/2019   CALCIUM 8.5 (L) 02/26/2020   CALCIUM 9.4 04/26/2019   CALCIUM 9.1 04/02/2019   LFT Recent Labs    02/26/20 0938  PROT 6.7  ALBUMIN 2.7*  AST 204*  ALT 85*  ALKPHOS 130*  BILITOT 1.4*   PT/INR No results found for: INR, PROTIME Hepatitis Panel No results for input(s): HEPBSAG, HCVAB, HEPAIGM, HEPBIGM in the last 72 hours. C-Diff No components found for: CDIFF Lipase     Component Value Date/Time   LIPASE 21  04/25/2017 1051    Drugs of Abuse     Component Value Date/Time   LABOPIA NONE DETECTED 02/26/2020 1054   COCAINSCRNUR NONE DETECTED 02/26/2020 1054   LABBENZ NONE DETECTED 02/26/2020 1054   AMPHETMU NONE DETECTED 02/26/2020 1054   THCU NONE DETECTED 02/26/2020 1054   LABBARB NONE DETECTED 02/26/2020 1054     RADIOLOGY STUDIES: DG Chest 2 View  Result Date: 02/26/2020 CLINICAL DATA:  Weakness and dizziness EXAM: CHEST - 2 VIEW COMPARISON:  January 30, 2016 FINDINGS: There is a calcified granuloma in the left mid lung. The lungs elsewhere are clear. The heart size and pulmonary vascularity are normal. No adenopathy. No pneumothorax. No bone lesions. IMPRESSION: Calcified granuloma left mid lung. Lungs elsewhere clear. Cardiac silhouette within normal limits. Electronically Signed   By: Bretta Bang III M.D.   On: 02/26/2020 10:51   CT Head Wo Contrast  Result Date: 02/26/2020 CLINICAL DATA:  Vertigo, peripheral. Additional history provided: Sudden onset of dizziness, blurred vision earlier today, symptoms lasted for approximately 30-45 seconds, now improved but not resolved. EXAM: CT HEAD WITHOUT CONTRAST TECHNIQUE: Contiguous axial images were obtained from the base of the skull through the vertex without intravenous contrast. COMPARISON:  No pertinent prior studies available for comparison. FINDINGS: Brain: There is no acute intracranial hemorrhage. No demarcated cortical infarct. No extra-axial fluid collection. No evidence of intracranial mass. No midline shift. Vascular: No hyperdense vessel. Skull: Normal. Negative for fracture or focal lesion. Sinuses/Orbits: Visualized orbits show no acute finding. Air-fluid levels within the visualized bilateral maxillary sinuses. Mucosal thickening and  scattered secretions within the bilateral ethmoid air cells. No significant mastoid effusion. IMPRESSION: 1. No evidence of acute intracranial abnormality. 2. Paranasal sinus disease as described.  Correlate for acute sinusitis. Electronically Signed   By: Jackey LogeKyle  Golden DO   On: 02/26/2020 11:41    IMPRESSION:   *    Normocytic anemia.  Symptoms of fatigue, exhaustion out of proportion to current Hgb.   FOBT positive. Single incident Minor blood per rectum last week. No prior colonoscopy or EGD.  *   Elevated LFTs.  If patient is being honest, he is not drinking excessively. Thrombocytopenia always raises concerns for cirrhosis.    PLAN:     *    Colonoscopy and EGD, tomorrow?  I think it would be easier to get this done while he is inpatient given his work schedule and increase likelihood he is not going to be able to follow-up for outpatient procedures.  I do not think he has health insurance either which makes outpatient follow-up problematic.  *    Clear liquids, begin after ultrasound abdomen completed..  *   Stop the Protonix drip.  There is no indication of melena or acute upper GI hemorrhage.  Addendum at 4:30 PM.  Patient is Covid 19+ which may be the explanation for his symptoms rather than the Hb of 11.6. Canceling the colonoscopy but leaving the endoscopy on the schedule for tomorrow.  Will be n.p.o. after midnight.  If he does not have any overt bleeding overnight we may end up canceling the endoscopy as well.   Dr. Ophelia CharterYates restarted the Protonix drip. Also no liver dz or abnormal findings on the ultrasound   Jennye MoccasinSarah Gribbin  02/26/2020, 12:25 PM Phone [phone]  I have reviewed the entire case in detail with the above APP and discussed the plan in detail.  Therefore, I agree with the diagnoses recorded above. In addition,  I have personally interviewed and examined the patient on the Covid unit.  My additional thoughts are as follows:  He incidentally reported some recent anal bleeding with blood on the paper after BM. He apparently also reported some recent black stool which seems to been concerning for melena, stool was reportedly heme positive on ED check,  and then there was concern for upper GI bleeding.  He was put on Protonix drip and plans made for EGD and/or colonoscopy tomorrow.  However, I did a rectal exam and he has a small amount of green soft stool in the rectal vault that is decidedly not melena.  I have canceled plans for any endoscopic procedures for this patient tomorrow.  Aside from the fact that he is Covid positive and the exposure risk to staff, he is not having overt GI bleeding presently and does not need endoscopic procedures.  I think he is not likely to need them as inpatient unless overt GI bleeding occurs during his hospital stay.  He reports a recent cough that he attributes to smoking.  He is breathing comfortably on room air and does not seem to have severe Covid symptoms. 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  As such, his employer and the health department need to be made aware of his Covid positivity.    While his hemoglobin is lower than his last known baseline, it has been a slow decrease over a year.  I suspect that is likely marrow suppression from alcohol abuse, which is  also likely the cause for his mild thrombocytopenia.  He has elevated LFTs that appear to be from excess alcohol use. He has no physical exam evidence of end-stage liver disease, and he has a normal right upper quadrant ultrasound today.  (Incidentally, it appears patient was scheduled to see [place] for screening colonoscopy last fall but he must have canceled).   He can have a regular diet, and unless he has other acute issues arise, he can normal certainly be discharged very soon.  He should then see [place] in clinic in 4 to 6 weeks to discuss a colonoscopy.  Charlie Pitter III [phone]

## 2020-02-26 NOTE — Progress Notes (Signed)
Pt admitted with suspected GIB and COVID. Remdesivir ordered. He is on RA. ALT elevated but less than 10x upper limit.  Remdesivir 200mg  IV x1 then 100mg  IV q24 x4 Rx signs off  , PharmD, [place], AAHIVP, CPP Infectious Disease Pharmacist 02/26/2020 5:47 PM

## 2020-02-26 NOTE — H&P (Addendum)
History and Physical    Cameron Rogers QIW:[id number] DOB: 1966/09/29 DOA: 02/26/2020  PCP: [name], DO Consultants:  None Patient coming from:  Home - lives alone; NOK: Alba Destine, sister, [phone]  Chief Complaint: Weakness  HPI: Cameron Rogers is a 54 y.o. male with medical history significant of HTN, ETOH abuse presenting with weakness. He reports that he was sitting waiting on the bus.  He kind of felt bad sitting outside.  He got on the bus and sat down and his feet went numb and he noticed tingling in his arm and he felt light-headed/dizzy and seeing blurry.  He texted his boss and told her he needed to come to the hospital.  He has noticed that his stools are black since last Thursday; he was trying to get in touch with Primary Care about that.  No abdominal pain.  No n/v.  His stools have been runny, like diarrhea, usually twice a day.  He has not ever had GI bleeding before.    ED Course:  Weakness, black diarrhea today.  Hgb 11, down from 13 and then 15.  Heme positive black stool.  Platelets low, LFTs high, h/o ETOH abuse.  GI consulted.  Started on Protonix.  Likely UGI bleed in the setting of ETOH abuse.  Review of Systems: As per HPI; otherwise review of systems reviewed and negative.   Ambulatory Status:  Ambulates without assistance  COVID Vaccine Status:  None  Past Medical History:  Diagnosis Date  . Allergy    SEASONAL  . Arthritis   . Heart murmur   . Hypertension     Past Surgical History:  Procedure Laterality Date  . NO PAST SURGERIES      Social History   Socioeconomic History  . Marital status: Single    Spouse name: Not on file  . Number of children: Not on file  . Years of education: Not on file  . Highest education level: Not on file  Occupational History  . Occupation: Oceanographer  Tobacco Use  . Smoking status: Current Every Day Smoker    Packs/day: 0.50    Years: 38.00    Pack years: 19.00    Types:  Cigarettes  . Smokeless tobacco: Never Used  Substance and Sexual Activity  . Alcohol use: Yes    Alcohol/week: 2.0 standard drinks    Types: 2 Cans of beer per week    Comment: 1 24 ounce can every 2 years; h/o heavy use, decreased use about a year ago  . Drug use: No  . Sexual activity: Not on file  Other Topics Concern  . Not on file  Social History Narrative  . Not on file   Social Determinants of Health   Financial Resource Strain:   . Difficulty of Paying Living Expenses:   Food Insecurity:   . Worried About Programme researcher, broadcasting/film/video in the Last Year:   . Barista in the Last Year:   Transportation Needs:   . Freight forwarder (Medical):   [name] Lack of Transportation (Non-Medical):   Physical Activity:   . Days of Exercise per Week:   . Minutes of Exercise per Session:   Stress:   . Feeling of Stress :   Social Connections:   . Frequency of Communication with Friends and Family:   . Frequency of Social Gatherings with Friends and Family:   . Attends Religious Services:   . Active Member of Clubs or Organizations:   .  Attends Archivist Meetings:   [name] Marital Status:   Intimate Partner Violence:   . Fear of Current or Ex-Partner:   . Emotionally Abused:   [name] Physically Abused:   . Sexually Abused:     No Known Allergies  Family History  Problem Relation Age of Onset  . Breast cancer Sister   . Colon cancer Neg Hx   . Colon polyps Neg Hx   . Esophageal cancer Neg Hx   . Rectal cancer Neg Hx   . Stomach cancer Neg Hx     Prior to Admission medications   Medication Sig Start Date End Date Taking? Authorizing Provider  amLODipine (NORVASC) 10 MG tablet Take 1 tablet (10 mg total) by mouth daily. 02/17/20  Yes Nicolette Bang, DO  gabapentin (NEURONTIN) 300 MG capsule Take 1 capsule (300 mg total) by mouth at bedtime. 06/06/19  Yes Charlott Rakes, MD  losartan (COZAAR) 25 MG tablet Take 1 tablet by mouth once daily 02/17/20  Yes Nicolette Bang, DO  hydrochlorothiazide (HYDRODIURIL) 25 MG tablet Take 1 tablet (25 mg total) by mouth daily. 06/02/16 04/02/19  Argentina Donovan, PA-C  omeprazole (PRILOSEC) 20 MG capsule Take 1 capsule (20 mg total) by mouth daily. 04/25/17 04/02/19  Davonna Belling, MD  promethazine (PHENERGAN) 25 MG tablet Take 1 tablet (25 mg total) by mouth every 8 (eight) hours as needed for nausea or vomiting. 04/25/17 04/02/19  Davonna Belling, MD    Physical Exam: Vitals:   02/26/20 1300 02/26/20 1315 02/26/20 1330 02/26/20 1345  BP: 105/78 112/79 114/75 122/70  Pulse: 73 72 72 78  Resp:      Temp:      TempSrc:      SpO2: 99% 100% 99% 99%  Weight:      Height:         . General:  Appears calm and comfortable and is NAD . Eyes:  PERRL, EOMI, normal lids, iris . ENT:  grossly normal hearing, lips & tongue, mmm . Neck:  no LAD, masses or thyromegaly . Cardiovascular:  RRR, no m/r/g. No LE edema.  [name] Respiratory:   CTA bilaterally with no wheezes/rales/rhonchi.  Normal respiratory effort. . Abdomen:  soft, NT, ND, NABS . Skin:  no rash or induration seen on limited exam . Musculoskeletal:  grossly normal tone BUE/BLE, good ROM, no bony abnormality . Psychiatric:  Blunted mood and affect, speech fluent and appropriate, AOx3 . Neurologic:  CN 2-12 grossly intact, moves all extremities in coordinated fashion    Radiological Exams on Admission: DG Chest 2 View  Result Date: 02/26/2020 CLINICAL DATA:  Weakness and dizziness EXAM: CHEST - 2 VIEW COMPARISON:  January 30, 2016 FINDINGS: There is a calcified granuloma in the left mid lung. The lungs elsewhere are clear. The heart size and pulmonary vascularity are normal. No adenopathy. No pneumothorax. No bone lesions. IMPRESSION: Calcified granuloma left mid lung. Lungs elsewhere clear. Cardiac silhouette within normal limits. Electronically Signed   By: Lowella Grip III M.D.   On: 02/26/2020 10:51   CT Head Wo Contrast  Result Date:  02/26/2020 CLINICAL DATA:  Vertigo, peripheral. Additional history provided: Sudden onset of dizziness, blurred vision earlier today, symptoms lasted for approximately 30-45 seconds, now improved but not resolved. EXAM: CT HEAD WITHOUT CONTRAST TECHNIQUE: Contiguous axial images were obtained from the base of the skull through the vertex without intravenous contrast. COMPARISON:  No pertinent prior studies available for comparison. FINDINGS: Brain: There is no acute  intracranial hemorrhage. No demarcated cortical infarct. No extra-axial fluid collection. No evidence of intracranial mass. No midline shift. Vascular: No hyperdense vessel. Skull: Normal. Negative for fracture or focal lesion. Sinuses/Orbits: Visualized orbits show no acute finding. Air-fluid levels within the visualized bilateral maxillary sinuses. Mucosal thickening and scattered secretions within the bilateral ethmoid air cells. No significant mastoid effusion. IMPRESSION: 1. No evidence of acute intracranial abnormality. 2. Paranasal sinus disease as described. Correlate for acute sinusitis. Electronically Signed   By: Jackey Loge DO   On: 02/26/2020 11:41    EKG: Independently reviewed.  NSR with rate 80; nonspecific ST changes with no evidence of acute ischemia   Labs on Admission: I have personally reviewed the available labs and imaging studies at the time of the admission.  Pertinent labs:   Na++ 129; 139 in 04/2019 Glucose 104 Albumin 2.7 AST 204/ALT 85/Bili 1.4; 62/77/0.6 in 04/2019 HS troponin 22, 20 WBC 5.7 Hgb 11.6; 13.8 on 7/10; 15.8 on 6/16 Platelets 132 Normal TSH in 05/2019 UA: small Hgb Heme positive UDS negative COVID POSITIVE INR 1.1   Assessment/Plan Principal Problem:   Acute upper GI bleed Active Problems:   Essential hypertension   Elevated PSA   Elevated liver function tests   Prediabetes   Alcohol abuse   COVID-19 virus infection    Upper GI bleed -Patient's lightheadedness and fatigue are  most likely caused by upper GI bleeding and/or COVID-19 infection. -Patient has history of heavy ETOH use, no known ho cirrhosis -Differential diagnosis includes gastric or duodenal ulcer, gastritis/duodentitis, and less likely variceal bleeding -His Hgb decreased from 15 last June to 13.8 in July to 11.6 now; will recheck tonight and tomorrow AM.  -Type and screen were done in ED.   - will observe in tele bed - GI consulted by ED, will follow up recommendations - NPO after midnight for possible EGD - but this may not be needed due to COVID issue - LR at 50 mL/hr - Start IV pantoprazole drip - Zofran IV for nausea - Avoid NSAIDs and SQ heparin - Maintain IV access (2 large bore IVs if possible). - Monitor closely and follow cbc, transfuse as necessary. - Consider Rocephin for PPx; will defer to GI   COVID-19 infection -Patient with presenting with fatigue, dizziness, and apparent UGI bleeding -He does not have an O2 requirement at this time -COVID POSITIVE -The patient has comorbidities which may increase the risk for ARDS/MODS including:  HTN -Pertinent labs concerning for COVID include normal WBC count;  increased LFTs; other COVID markers are pending -CXR normal at this time -Will treat with broad-spectrum antibiotics if procalcitonin >0.25.   -Monitor on telemetry x at least 24 hours -At this time, will attempt to avoid use of aerosolized medications and use HFAs instead if needed -Will check daily labs including BMP with Mag, Phos; LFTs; CBC with differential; CRP; ferritin; fibrinogen; D-dimer -Will order steroids and Remdesivir (pharmacy consult) given +COVID test if labs suggest that this is needed -If the patient shows clinical deterioration, consider transfer to ICU with PCCM consultation -Patient was seen wearing full PPE including: gown, gloves, head cover, N95, and face shield; donning and doffing was in compliance with current standards.  ETOH abuse with elevated  LFTs -Elevated LFTs -Reports h/o heavy ETOH use but has cut back recently to 24 ounces every 1-2 days -GI is consulting -CIWA protocol ordered -His sister reports that she does not think he has cut down and is still drinking too much  Elevated PSA -4.8 last July -Needs outpatient f/u  HTN -Continue Norvasc tomorrow -Hold Cozaar for now  Prediabetes -A1c 5.9 last July -Glucose is only 104 today -Will follow fasting glucose for now     DVT prophylaxis: SCDs  Code Status:  Full - confirmed with patient Family Communication: None present; I spoke with his sister at the time of admission Disposition Plan:  The patient is from: home  Anticipated d/c is to: home without [facility] services   Anticipated d/c date will depend on clinical response to treatment, likely between 3 and 5 days if Remdesivir is needed but possibly as early as tomorrow if not  Patient is currently: acutely ill Consults called: GI Admission status: It is my clinical opinion that referral for OBSERVATION is reasonable and necessary in this patient based on the above information provided. The aforementioned taken together are felt to place the patient at high risk for further clinical deterioration. However it is anticipated that the patient may be medically stable for discharge from the hospital within 24 to 48 hours.           Jonah Blue MD [facility]   How to contact the [facility] Attending or Consulting provider 7A - 7P or covering provider during after hours 7P -7A, for this patient?  1. Check the care team in [facility] and look for a) attending/consulting [facility] provider listed and b) the [facility] team listed 2. Log into [email or web address] and use [place]'s universal password to access. If you do not have the password, please contact the hospital operator. 3. Locate the [facility] provider you are looking for under [facility] and page to a number that you can be directly reached. 4. If you still have difficulty  reaching the provider, please page the [facility] (Director on Call) for the Hospitalists listed on amion for assistance.   02/26/2020, 2:52 PM

## 2020-02-26 NOTE — ED Triage Notes (Signed)
Arrived via EMS; c/o of weakness since yesterday and dizziness since 7am; along w/ polyuria and nausea. Endorsed hx of  HTN and DM; denies any recent changes with medications

## 2020-02-26 NOTE — Progress Notes (Signed)
Pt admitted to 5w11 from ED. A&O x4, skin intact. VS stable, Tele monitor placed & verified. IV R AC & R FA, LR @ 50. Pt ambulatory in room. All questions/concerns addressed. Call bell within reach, will continue to monitor.   02/26/20 1715  Vitals  Temp 99.8 F (37.7 C)  Temp Source Oral  BP 126/73  MAP (mmHg) 86  BP Location Left Arm  BP Method Automatic  Patient Position (if appropriate) Sitting  Pulse Rate 74  Resp 18  Level of Consciousness  Level of Consciousness Alert  Oxygen Therapy  SpO2 100 %  O2 Device Room Air  Pain Assessment  Pain Score 0  MEWS Score  MEWS Temp 0  MEWS Systolic 0  MEWS Pulse 0  MEWS RR 0  MEWS LOC 0  MEWS Score 0  MEWS Score Color Green

## 2020-02-27 ENCOUNTER — Encounter (HOSPITAL_COMMUNITY)
Admission: EM | Disposition: A | Discharge status: Home / Self Care | Payer: Self-pay | Source: Home / Self Care | Attending: Internal Medicine

## 2020-02-27 DIAGNOSIS — R7989 Other specified abnormal findings of blood chemistry: Secondary | ICD-10-CM | POA: Diagnosis not present

## 2020-02-27 DIAGNOSIS — D6959 Other secondary thrombocytopenia: Secondary | ICD-10-CM | POA: Diagnosis present

## 2020-02-27 DIAGNOSIS — Z803 Family history of malignant neoplasm of breast: Secondary | ICD-10-CM | POA: Diagnosis not present

## 2020-02-27 DIAGNOSIS — R195 Other fecal abnormalities: Secondary | ICD-10-CM | POA: Diagnosis not present

## 2020-02-27 DIAGNOSIS — K922 Gastrointestinal hemorrhage, unspecified: Secondary | ICD-10-CM | POA: Diagnosis not present

## 2020-02-27 DIAGNOSIS — R7303 Prediabetes: Secondary | ICD-10-CM | POA: Diagnosis present

## 2020-02-27 DIAGNOSIS — J1282 Pneumonia due to coronavirus disease 2019: Secondary | ICD-10-CM | POA: Diagnosis present

## 2020-02-27 DIAGNOSIS — D649 Anemia, unspecified: Secondary | ICD-10-CM | POA: Diagnosis not present

## 2020-02-27 DIAGNOSIS — I10 Essential (primary) hypertension: Secondary | ICD-10-CM | POA: Diagnosis present

## 2020-02-27 DIAGNOSIS — F17210 Nicotine dependence, cigarettes, uncomplicated: Secondary | ICD-10-CM | POA: Diagnosis present

## 2020-02-27 DIAGNOSIS — J9601 Acute respiratory failure with hypoxia: Secondary | ICD-10-CM | POA: Diagnosis present

## 2020-02-27 DIAGNOSIS — U071 COVID-19: Secondary | ICD-10-CM | POA: Diagnosis present

## 2020-02-27 DIAGNOSIS — F1010 Alcohol abuse, uncomplicated: Secondary | ICD-10-CM | POA: Diagnosis not present

## 2020-02-27 DIAGNOSIS — Z79899 Other long term (current) drug therapy: Secondary | ICD-10-CM | POA: Diagnosis not present

## 2020-02-27 LAB — MAGNESIUM: Magnesium: 2 mg/dL (ref 1.7–2.4)

## 2020-02-27 LAB — COMPREHENSIVE METABOLIC PANEL
ALT: 72 U/L — ABNORMAL HIGH (ref 0–44)
AST: 139 U/L — ABNORMAL HIGH (ref 15–41)
Albumin: 2.4 g/dL — ABNORMAL LOW (ref 3.5–5.0)
Alkaline Phosphatase: 114 U/L (ref 38–126)
Anion gap: 9 (ref 5–15)
BUN: <5 mg/dL — ABNORMAL LOW (ref 6–20)
CO2: 24 mmol/L (ref 22–32)
Calcium: 8.5 mg/dL — ABNORMAL LOW (ref 8.9–10.3)
Chloride: 103 mmol/L (ref 98–111)
Creatinine, Ser: 0.74 mg/dL (ref 0.61–1.24)
GFR calc Af Amer: >60 mL/min (ref >60)
GFR calc non Af Amer: >60 mL/min (ref >60)
Glucose, Bld: 107 mg/dL — ABNORMAL HIGH (ref 70–99)
Potassium: 3.9 mmol/L (ref 3.5–5.1)
Sodium: 136 mmol/L (ref 135–145)
Total Bilirubin: 0.9 mg/dL (ref 0.3–1.2)
Total Protein: 6.1 g/dL — ABNORMAL LOW (ref 6.5–8.1)

## 2020-02-27 LAB — CBC WITH DIFFERENTIAL/PLATELET
Abs Immature Granulocytes: 0.03 10*3/uL (ref 0.00–0.07)
Basophils Absolute: 0 10*3/uL (ref 0.0–0.1)
Basophils Relative: 0 %
Eosinophils Absolute: 0 10*3/uL (ref 0.0–0.5)
Eosinophils Relative: 0 %
HCT: 34 % — ABNORMAL LOW (ref 39.0–52.0)
Hemoglobin: 11.4 g/dL — ABNORMAL LOW (ref 13.0–17.0)
Immature Granulocytes: 1 %
Lymphocytes Relative: 21 %
Lymphs Abs: 1.2 10*3/uL (ref 0.7–4.0)
MCH: 29.1 pg (ref 26.0–34.0)
MCHC: 33.5 g/dL (ref 30.0–36.0)
MCV: 86.7 fL (ref 80.0–100.0)
Monocytes Absolute: 0.4 10*3/uL (ref 0.1–1.0)
Monocytes Relative: 7 %
Neutro Abs: 4 10*3/uL (ref 1.7–7.7)
Neutrophils Relative %: 71 %
Platelets: 144 10*3/uL — ABNORMAL LOW (ref 150–400)
RBC: 3.92 MIL/uL — ABNORMAL LOW (ref 4.22–5.81)
RDW: 15.2 % (ref 11.5–15.5)
WBC: 5.6 10*3/uL (ref 4.0–10.5)
nRBC: 0 % (ref 0.0–0.2)

## 2020-02-27 LAB — RETICULOCYTES
Immature Retic Fract: 7.7 % (ref 2.3–15.9)
RBC.: 3.73 MIL/uL — ABNORMAL LOW (ref 4.22–5.81)
Retic Count, Absolute: 40.7 10*3/uL (ref 19.0–186.0)
Retic Ct Pct: 1.1 % (ref 0.4–3.1)

## 2020-02-27 LAB — IRON AND TIBC
Iron: 33 ug/dL — ABNORMAL LOW (ref 45–182)
Saturation Ratios: 15 % — ABNORMAL LOW (ref 17.9–39.5)
TIBC: 221 ug/dL — ABNORMAL LOW (ref 250–450)
UIBC: 188 ug/dL

## 2020-02-27 LAB — FERRITIN: Ferritin: 611 ng/mL — ABNORMAL HIGH (ref 24–336)

## 2020-02-27 LAB — HEPATITIS B SURFACE ANTIBODY,QUALITATIVE: Hep B S Ab: REACTIVE — AB

## 2020-02-27 LAB — HEPATITIS B SURFACE ANTIGEN: Hepatitis B Surface Ag: NONREACTIVE

## 2020-02-27 LAB — C-REACTIVE PROTEIN: CRP: 3.1 mg/dL — ABNORMAL HIGH (ref <1.0)

## 2020-02-27 LAB — PHOSPHORUS: Phosphorus: 3.6 mg/dL (ref 2.5–4.6)

## 2020-02-27 LAB — VITAMIN B12: Vitamin B-12: 1151 pg/mL — ABNORMAL HIGH (ref 180–914)

## 2020-02-27 LAB — BRAIN NATRIURETIC PEPTIDE: B Natriuretic Peptide: 278.1 pg/mL — ABNORMAL HIGH (ref 0.0–100.0)

## 2020-02-27 LAB — D-DIMER, QUANTITATIVE: D-Dimer, Quant: 0.45 ug/mL-FEU (ref 0.00–0.50)

## 2020-02-27 LAB — FOLATE: Folate: 27.6 ng/mL (ref >5.9)

## 2020-02-27 LAB — HEPATITIS C ANTIBODY: HCV Ab: NONREACTIVE

## 2020-02-27 LAB — HEPATITIS A ANTIBODY, TOTAL: hep A Total Ab: NONREACTIVE

## 2020-02-27 SURGERY — ESOPHAGOGASTRODUODENOSCOPY (EGD) WITH PROPOFOL
Anesthesia: Monitor Anesthesia Care

## 2020-02-27 MED ORDER — HEPARIN SODIUM (PORCINE) 5000 UNIT/ML IJ SOLN
5000.0000 [IU] | Freq: Three times a day (TID) | INTRAMUSCULAR | Status: DC
  Administered 2020-02-27 – 2020-03-01 (×9): 5000 [IU] via SUBCUTANEOUS
  Filled 2020-02-27 (×9): qty 1

## 2020-02-27 NOTE — Progress Notes (Signed)
PROGRESS NOTE                                                                                                                                                                                                             Patient Demographics:    Cameron Rogers, is a 54 y.o. male, DOB - 03-25-1966, TWS:[id number]  Outpatient Primary MD for the patient is Nicolette Bang, DO    LOS - 0  Admit date - 02/26/2020    Chief Complaint  Patient presents with  . Dizziness  . Weakness  . Nausea  . Urinary Frequency       Brief Narrative - 54 year old Caucasian male with history of alcohol use which he states now is intermittent to 3-4 times a week, no previously known medical problems who felt dizzy at a bus stop and then came to the hospital.  In the hospital there was concern of GI bleed with black-colored stool which was heme positive, he was found to be mildly anemic and incidentally positive for Covid.  He subsequently developed mild shortness of breath and was admitted.  In the ER he was also seen by GI and had a head CT and right upper quadrant ultrasound which were unremarkable.    Subjective:    Cameron Rogers today has, No headache, No chest pain, No abdominal pain - No Nausea, No new weakness tingling or numbness, mild cough but no shortness of breath.   Assessment  & Plan :     1. Acute Hypoxic Resp. Failure due to Acute Covid 19 Viral Pneumonitis during the ongoing 2020 Covid 19 Pandemic -he has mild disease and has been started on steroids and remdesivir combination, will continue to monitor closely.  He has mild disease but has been consented for Actemra use if needed.  Encouraged the patient to sit up in chair in the daytime use I-S and flutter valve for pulmonary toiletry and then prone in bed when at night.  Will advance activity and titrate down oxygen as possible.   SpO2: 98 %  Recent Labs  Lab 02/26/20  1218 02/26/20 1540 02/27/20 0616  CRP  --  3.6* 3.1*  DDIMER  --  0.56* 0.45  BNP  --   --  278.1*  PROCALCITON  --  0.69  --   SARSCOV2NAA POSITIVE*  --   --  Hepatic Function Latest Ref Rng & Units 02/27/2020 02/26/2020 04/26/2019  Total Protein 6.5 - 8.1 g/dL 6.1(L) 6.7 6.7  Albumin 3.5 - 5.0 g/dL 2.4(L) 2.7(L) 4.0  AST 15 - 41 U/L 139(H) 204(H) 62(H)  ALT 0 - 44 U/L 72(H) 85(H) 77(H)  Alk Phosphatase 38 - 126 U/L 114 130(H) 101  Total Bilirubin 0.3 - 1.2 mg/dL 0.9 1.4(H) 0.6     2.  Ascitic anemia with heme positive stool.  BUN stable hence unlikely upper GI, continue PPI, check anemia panel, seen by GI.  Outpatient follow-up.  Currently no active symptoms or signs of active bleeding.  3.  Past history of alcohol abuse currently intermittent use.  Counseled to quit.  No signs of DTs will monitor closely, mild thrombocytopenia could be related to acute COVID-19 infection.  4.  Covidt induced thrombocytopenia continue to monitor.  Right upper quadrant ultrasound does not reveal any cirrhotic features.  5.  Mild hepatitis likely has history of alcohol abuse related.  AST is more than ALT, monitor right upper quadrant ultrasound unremarkable, INR stable suggesting stable synthetic liver function.     Condition - Fair  Family Communication  :  None  Code Status :  Full  Consults  :  GI  Procedures  :    RUQ [place] - Non acute  CT Head - Non acute  PUD Prophylaxis : PPI  Disposition Plan  :    Status is: Inpt  Dispo: The patient is from: Home              Anticipated d/c is to: Home              Anticipated d/c date is: > 3 days              Patient currently is not medically stable to d/c.  Getting treatment for COVID-19 infection with IV remdesivir course   DVT Prophylaxis  :   Heparin   Lab Results  Component Value Date   PLT 144 (L) 02/27/2020    Diet :  Diet Order            Diet regular Room service appropriate? Yes; Fluid consistency: Thin  Diet  effective now               Inpatient Medications  Scheduled Meds: . amLODipine  10 mg Oral Daily  . dexamethasone (DECADRON) injection  6 mg Intravenous Q24H  . folic acid  1 mg Oral Daily  . gabapentin  300 mg Oral QHS  . heparin injection (subcutaneous)  5,000 Units Subcutaneous Q8H  . multivitamin with minerals  1 tablet Oral Daily  . nicotine  14 mg Transdermal Daily  . pantoprazole  40 mg Oral QAC breakfast  . sodium chloride flush  3 mL Intravenous Q12H  . thiamine  100 mg Oral Daily   Continuous Infusions: . lactated ringers 50 mL/hr at 02/26/20 1537  . remdesivir 100 mg in NS 100 mL     PRN Meds:.acetaminophen **OR** [DISCONTINUED] acetaminophen, hydrALAZINE, LORazepam **OR** LORazepam, [DISCONTINUED] ondansetron **OR** ondansetron (ZOFRAN) IV  Antibiotics  :    Anti-infectives (From admission, onward)   Start     Dose/Rate Route Frequency Ordered Stop   02/27/20 1600  remdesivir 100 mg in sodium chloride 0.9 % 100 mL IVPB     100 mg 200 mL/hr over 30 Minutes Intravenous Daily 02/26/20 1736 03/02/20 0959   02/26/20 1900  remdesivir 200 mg in sodium chloride 0.9% 250 mL  IVPB     200 mg 580 mL/hr over 30 Minutes Intravenous Once 02/26/20 1736 02/26/20 2020       Time Spent in minutes  30   Lala Lund M.D on 02/27/2020 at 11:16 AM  To page go to [email or web address] - password [facility]  [facility] -  Office  [phone]  See all Orders from today for further details    Objective:   Vitals:   02/26/20 1938 02/27/20 0000 02/27/20 0400 02/27/20 0800  BP:  112/66 122/78 (!) 135/94  Pulse:  66 63 69  Resp:  '20 15 20  ' Temp: 99.1 F (37.3 C) 98.2 F (36.8 C) 98.2 F (36.8 C) 98 F (36.7 C)  TempSrc: Oral Oral Oral Oral  SpO2:  97% 99% 98%  Weight:      Height:        Wt Readings from Last 3 Encounters:  02/26/20 86.2 kg  07/12/19 79.6 kg  06/06/19 75.2 kg     Intake/Output Summary (Last 24 hours) at 02/27/2020 1116 Last data filed at  02/27/2020 0859 Gross per 24 hour  Intake 1999.96 ml  Output 2155 ml  Net -155.04 ml     Physical Exam  Awake Alert, No new F.N deficits, Normal affect [place].AT,PERRAL Supple Neck,No JVD, No cervical lymphadenopathy appriciated.  Symmetrical Chest wall movement, Good air movement bilaterally, CTAB RRR,No Gallops,Rubs or new Murmurs, No Parasternal Heave +ve B.Sounds, Abd Soft, No tenderness, No organomegaly appriciated, No rebound - guarding or rigidity. No Cyanosis, Clubbing or edema, No new Rash or bruise      Data Review:    CBC Recent Labs  Lab 02/26/20 0938 02/26/20 1600 02/27/20 0616  WBC 5.7 5.7 5.6  HGB 11.6* 11.5* 11.4*  HCT 33.8* 34.0* 34.0*  PLT 132* PLATELET CLUMPS NOTED ON SMEAR, COUNT APPEARS ADEQUATE 144*  MCV 87.6 87.9 86.7  MCH 30.1 29.7 29.1  MCHC 34.3 33.8 33.5  RDW 14.9 15.1 15.2  LYMPHSABS 1.2  --  1.2  MONOABS 0.6  --  0.4  EOSABS 0.0  --  0.0  BASOSABS 0.0  --  0.0    Chemistries  Recent Labs  Lab 02/26/20 0938 02/26/20 1230 02/27/20 0616  NA 129*  --  136  K 3.6  --  3.9  CL 96*  --  103  CO2 21*  --  24  GLUCOSE 104*  --  107*  BUN 11  --  <5*  CREATININE 1.14  --  0.74  CALCIUM 8.5*  --  8.5*  AST 204*  --  139*  ALT 85*  --  72*  ALKPHOS 130*  --  114  BILITOT 1.4*  --  0.9  MG 2.0  --  2.0  INR  --  1.1  --      ------------------------------------------------------------------------------------------------------------------ No results for input(s): CHOL, HDL, LDLCALC, TRIG, CHOLHDL, LDLDIRECT in the last 72 hours.  Lab Results  Component Value Date   HGBA1C 5.9 (H) 04/26/2019   ------------------------------------------------------------------------------------------------------------------ No results for input(s): TSH, T4TOTAL, T3FREE, THYROIDAB in the last 72 hours.  Invalid input(s): FREET3  Cardiac Enzymes No results for input(s): CKMB, TROPONINI, MYOGLOBIN in the last 168 hours.  Invalid input(s): CK  ------------------------------------------------------------------------------------------------------------------    Component Value Date/Time   BNP 278.1 (H) 02/27/2020 0600    Micro Results Recent Results (from the past 240 hour(s))  SARS Coronavirus 2 by RT PCR (hospital order, performed in [facility] hospital lab) Nasopharyngeal Nasopharyngeal Swab     Status: Abnormal  Collection Time: 02/26/20 12:18 PM   Specimen: Nasopharyngeal Swab  Result Value Ref Range Status   SARS Coronavirus 2 POSITIVE (A) NEGATIVE Final    Comment: RESULT CALLED TO, READ BACK BY AND VERIFIED WITH: Gareth Eagle RN 14:20 02/26/20 ([email or web address]) (NOTE) SARS-CoV-2 target nucleic acids are DETECTED SARS-CoV-2 RNA is generally detectable in upper respiratory specimens  during the acute phase of infection.  Positive results are indicative  of the presence of the identified virus, but do not rule out bacterial infection or co-infection with other pathogens not detected by the test.  Clinical correlation with patient history and  other diagnostic information is necessary to determine patient infection status.  The expected result is negative. Fact Sheet for Patients:   StrictlyIdeas.no  Fact Sheet for Healthcare Providers:   BankingDealers.co.za   This test is not yet approved or cleared by the [place] FDA and  has been authorized for detection and/or diagnosis of SARS-CoV-2 by FDA under an Emergency Use Authorization (EUA).  This EUA will remain in effect (meaning this test c an be used) for the duration of  the COVID-19 declaration under Section 564(b)(1) of the Act, 21 U.S.C. section 360-bbb-3(b)(1), unless the authorization is terminated or revoked sooner. Performed at [facility], [place] [address], [place], [place] [address]     Radiology Reports DG Chest 2 View  Result Date: 02/26/2020 CLINICAL DATA:  Weakness and dizziness EXAM: CHEST - 2  VIEW COMPARISON:  January 30, 2016 FINDINGS: There is a calcified granuloma in the left mid lung. The lungs elsewhere are clear. The heart size and pulmonary vascularity are normal. No adenopathy. No pneumothorax. No bone lesions. IMPRESSION: Calcified granuloma left mid lung. Lungs elsewhere clear. Cardiac silhouette within normal limits. Electronically Signed   By: Lowella Grip III M.D.   On: 02/26/2020 10:51   CT Head Wo Contrast  Result Date: 02/26/2020 CLINICAL DATA:  Vertigo, peripheral. Additional history provided: Sudden onset of dizziness, blurred vision earlier today, symptoms lasted for approximately 30-45 seconds, now improved but not resolved. EXAM: CT HEAD WITHOUT CONTRAST TECHNIQUE: Contiguous axial images were obtained from the base of the skull through the vertex without intravenous contrast. COMPARISON:  No pertinent prior studies available for comparison. FINDINGS: Brain: There is no acute intracranial hemorrhage. No demarcated cortical infarct. No extra-axial fluid collection. No evidence of intracranial mass. No midline shift. Vascular: No hyperdense vessel. Skull: Normal. Negative for fracture or focal lesion. Sinuses/Orbits: Visualized orbits show no acute finding. Air-fluid levels within the visualized bilateral maxillary sinuses. Mucosal thickening and scattered secretions within the bilateral ethmoid air cells. No significant mastoid effusion. IMPRESSION: 1. No evidence of acute intracranial abnormality. 2. Paranasal sinus disease as described. Correlate for acute sinusitis. Electronically Signed   By: Kellie Simmering DO   On: 02/26/2020 11:41   US Abdomen Limited  Result Date: 02/26/2020 CLINICAL DATA:  Elevated LFTs EXAM: ULTRASOUND ABDOMEN LIMITED RIGHT UPPER QUADRANT COMPARISON:  None. FINDINGS: Gallbladder: Gallbladder is decompressed with thickened wall 4.4 mm. This is felt to be related to the decompression. No gallstones are seen. Negative sonographic Murphy's sign is noted.  Common bile duct: Diameter: 3.7 mm. Liver: No focal lesion identified. Within normal limits in parenchymal echogenicity. Portal vein is patent on color Doppler imaging with normal direction of blood flow towards the liver. Other: None. IMPRESSION: Decompressed gallbladder with wall thickening which is felt to be related to the decompression. No other focal abnormality is noted. Electronically Signed   By: Elta Guadeloupe  Lukens M.D.   On: 02/26/2020 15:09

## 2020-02-27 NOTE — Progress Notes (Addendum)
Daily Rounding Note  02/27/2020, 9:06 AM  LOS: 0 days   SUBJECTIVE:   Chief complaint: anemia.  Limited BPR 1 week ago.  Phone conversation w pt.     Pt asking when he can discharge.  He feels well. Verified w pt that stools are dark, loose.  No abd pain, no nausea.  Good appetite.    OBJECTIVE:         Vital signs in last 24 hours:    Temp:  [98 F (36.7 C)-99.8 F (37.7 C)] 98 F (36.7 C) (05/13 0800) Pulse Rate:  [63-82] 69 (05/13 0800) Resp:  [15-20] 20 (05/13 0800) BP: (105-135)/(66-94) 135/94 (05/13 0800) SpO2:  [97 %-100 %] 98 % (05/13 0800) Weight:  [83 kg-86.2 kg] 86.2 kg (05/12 1004) Last BM Date: 02/26/20 Filed Weights   02/26/20 0936 02/26/20 1004  Weight: 83 kg 86.2 kg   General: Not re-examined.     Neuro/Psych:  Alert, appropriate, no confusion.  Fluid speech.    Intake/Output from previous day: 05/12 0701 - 05/13 0700 In: 1760 [I.V.:725.5; IV Piggyback:1034.5] Out: 3295 [Urine:1705]  Intake/Output this shift: Total I/O In: 240 [P.O.:240] Out: 450 [Urine:450]  Lab Results: Recent Labs    02/26/20 0938 02/26/20 1600 02/27/20 0616  WBC 5.7 5.7 5.6  HGB 11.6* 11.5* 11.4*  HCT 33.8* 34.0* 34.0*  PLT 132* PLATELET CLUMPS NOTED ON SMEAR, COUNT APPEARS ADEQUATE 144*   BMET Recent Labs    02/26/20 0938 02/27/20 0616  NA 129* 136  K 3.6 3.9  CL 96* 103  CO2 21* 24  GLUCOSE 104* 107*  BUN 11 <5*  CREATININE 1.14 0.74  CALCIUM 8.5* 8.5*   LFT Recent Labs    02/26/20 0938 02/27/20 0616  PROT 6.7 6.1*  ALBUMIN 2.7* 2.4*  AST 204* 139*  ALT 85* 72*  ALKPHOS 130* 114  BILITOT 1.4* 0.9   PT/INR Recent Labs    02/26/20 1230  LABPROT 13.4  INR 1.1   Hepatitis Panel No results for input(s): HEPBSAG, HCVAB, HEPAIGM, HEPBIGM in the last 72 hours.  Studies/Results: DG Chest 2 View  Result Date: 02/26/2020 CLINICAL DATA:  Weakness and dizziness EXAM: CHEST - 2 VIEW  COMPARISON:  January 30, 2016 FINDINGS: There is a calcified granuloma in the left mid lung. The lungs elsewhere are clear. The heart size and pulmonary vascularity are normal. No adenopathy. No pneumothorax. No bone lesions. IMPRESSION: Calcified granuloma left mid lung. Lungs elsewhere clear. Cardiac silhouette within normal limits. Electronically Signed   By: Lowella Grip III M.D.   On: 02/26/2020 10:51   CT Head Wo Contrast  Result Date: 02/26/2020 CLINICAL DATA:  Vertigo, peripheral. Additional history provided: Sudden onset of dizziness, blurred vision earlier today, symptoms lasted for approximately 30-45 seconds, now improved but not resolved. EXAM: CT HEAD WITHOUT CONTRAST TECHNIQUE: Contiguous axial images were obtained from the base of the skull through the vertex without intravenous contrast. COMPARISON:  No pertinent prior studies available for comparison. FINDINGS: Brain: There is no acute intracranial hemorrhage. No demarcated cortical infarct. No extra-axial fluid collection. No evidence of intracranial mass. No midline shift. Vascular: No hyperdense vessel. Skull: Normal. Negative for fracture or focal lesion. Sinuses/Orbits: Visualized orbits show no acute finding. Air-fluid levels within the visualized bilateral maxillary sinuses. Mucosal thickening and scattered secretions within the bilateral ethmoid air cells. No significant mastoid effusion. IMPRESSION: 1. No evidence of acute intracranial abnormality. 2. Paranasal sinus disease as described. Correlate  for acute sinusitis. Electronically Signed   By: Jackey Loge DO   On: 02/26/2020 11:41   US Abdomen Limited  Result Date: 02/26/2020 CLINICAL DATA:  Elevated LFTs EXAM: ULTRASOUND ABDOMEN LIMITED RIGHT UPPER QUADRANT COMPARISON:  None. FINDINGS: Gallbladder: Gallbladder is decompressed with thickened wall 4.4 mm. This is felt to be related to the decompression. No gallstones are seen. Negative sonographic Murphy's sign is noted.  Common bile duct: Diameter: 3.7 mm. Liver: No focal lesion identified. Within normal limits in parenchymal echogenicity. Portal vein is patent on color Doppler imaging with normal direction of blood flow towards the liver. Other: None. IMPRESSION: Decompressed gallbladder with wall thickening which is felt to be related to the decompression. No other focal abnormality is noted. Electronically Signed   By: Alcide Clever M.D.   On: 02/26/2020 15:09    ASSESMENT:   *  [place] anemia.  FOBT +.   Limited BPR x 1 w wiping last week.  Dark, softer to loose stools.    Hgb 11.6 >> 11.4.    *   Covid 19 positive w/o PNA.  Day 2 Remdesivir, Dexamethasone.    *   ETOH abuse w elevated LFTs but no sonographic liver dz, platelts and coags normal.     PLAN   *   Hepatitis C Ab, Hep B surface Ag/core Ab and surface Ab assay, Hep A Ab.    *   Will arrange GI ROV vs direct colonoscopy (EGD as well?) several weeks hence.   Continue daily PPI for now.      Jennye Moccasin  02/27/2020, 9:06 AM Phone [phone]  I have discussed the case with the PA, and that is the plan I formulated.  (Not examined to reduce staff exposure because patient is Covid positive)  Elevated LFTs  -excess alcohol use.  Acute hepatitis panel notable for positive hepatitis B surface antibody and negative surface antigen consistent with prior vaccination. Normocytic anemia Heme positive stool, unclear to what extent that is related to the anemia. Reported recent episode of anal bleeding, blood on paper after BM.   He is not having an overt GI bleed at this point.  His anemia is felt likely to be marrow suppression from alcohol use, most likely same for the thrombocytopenia.  He needs to decrease alcohol use for long-term liver health.  We will arrange outpatient follow-up for a colonoscopy in about 6 weeks after full recovery from Covid.  Signing off, call as needed  Time 25 minutes  Charlie Pitter III Office: [phone]

## 2020-02-27 NOTE — Evaluation (Signed)
Physical Therapy Evaluation/Discharge Patient Details Name: Cameron Rogers MRN: [id number] DOB: 25-Sep-1966 Today's Date: 02/27/2020   History of Present Illness  54 y.o. male admitted on 02/26/20 for dizziness, weakness, lightheadedness, blurry vision.  Pt reported black stools to ED physician.  Pt dx with UGIB and also tested positive for COVID 19.  Pt with significant PMH of HTN.    Clinical Impression  Pt walks independently with VSS on RA during gait.  No reports of lightheadedness or dizziness.  O2 sats >93% on RA during gait.  Pt is eager to d/c home. PT to sign off as pt has no acute PT needs at this time.     Follow Up Recommendations No PT follow up    Equipment Recommendations  None recommended by PT    Recommendations for Other Services   NA    Precautions / Restrictions Precautions Precautions: Other (comment) Precaution Comments: pt has painful corns on his feet, needs shoes on for ambulation.       Mobility  Bed Mobility Overal bed mobility: Independent                Transfers Overall transfer level: Independent                  Ambulation/Gait Ambulation/Gait assistance: Modified independent (Device/Increase time) Gait Distance (Feet): 150 Feet Assistive device: None Gait Pattern/deviations: Step-through pattern;Staggering left;Staggering right     General Gait Details: Pt with mildly staggering/antalgic gait pattern, during gait I figured out he has painful corns, we went back to the room and donned his shoes and he looked much better in the hallway.  No reports of DOE and O2 sats were 93% or higher on RA during ambulation.  staggering gait was better with shoes.          Balance Overall balance assessment: Needs assistance Sitting-balance support: Feet supported;No upper extremity supported Sitting balance-Leahy Scale: Good     Standing balance support: No upper extremity supported Standing balance-Leahy Scale: Good                                Pertinent Vitals/Pain Pain Assessment: Faces Faces Pain Scale: Hurts even more Pain Location: bottom of feet due to corns, did better with shoes than hospital socks.  Pain Descriptors / Indicators: Grimacing;Guarding Pain Intervention(s): Limited activity within patient's tolerance;Monitored during session;Repositioned    Home Living Family/patient expects to be discharged to:: Private residence Living Arrangements: Alone                    Prior Function Level of Independence: Independent         Comments: works full time at [employer] as a Administrator, arts.  Takes the bus for transportation     Hand Dominance   Dominant Hand: Right    Extremity/Trunk Assessment   Upper Extremity Assessment Upper Extremity Assessment: Overall WFL for tasks assessed    Lower Extremity Assessment Lower Extremity Assessment: Overall WFL for tasks assessed    Cervical / Trunk Assessment Cervical / Trunk Assessment: Normal  Communication   Communication: No difficulties  Cognition Arousal/Alertness: Awake/alert Behavior During Therapy: WFL for tasks assessed/performed Overall Cognitive Status: Within Functional Limits for tasks assessed  General Comments General comments (skin integrity, edema, etc.): Educated pt on how to remove himself from the monitor for in room ambulation.  Encouraged him to do laps around the room 3-4 times per day.  Pt showed proficiency with using flutter valve and IS (maxed out the volume on IS).          Assessment/Plan    PT Assessment Patent does not need any further PT services         PT Goals (Current goals can be found in the Care Plan section)  Acute Rehab PT Goals PT Goal Formulation: All assessment and education complete, DC therapy               AM-PAC PT "6 Clicks" Mobility  Outcome Measure Help needed turning from your back to your side  while in a flat bed without using bedrails?: None Help needed moving from lying on your back to sitting on the side of a flat bed without using bedrails?: None Help needed moving to and from a bed to a chair (including a wheelchair)?: None Help needed standing up from a chair using your arms (e.g., wheelchair or bedside chair)?: None Help needed to walk in hospital room?: None Help needed climbing 3-5 steps with a railing? : None 6 Click Score: 24    End of Session   Activity Tolerance: Patient tolerated treatment well Patient left: in chair;with call bell/phone within reach   PT Visit Diagnosis: Muscle weakness (generalized) (M62.81)    Time: 5427-0623 PT Time Calculation (min) (ACUTE ONLY): 16 min   Charges:         Corinna Capra, PT, DPT  Acute Rehabilitation #[phone] pager #[phone] office     PT Evaluation $PT Eval Low Complexity: 1 Low         02/27/2020, 1:18 PM

## 2020-02-28 LAB — COMPREHENSIVE METABOLIC PANEL
ALT: 66 U/L — ABNORMAL HIGH (ref 0–44)
AST: 105 U/L — ABNORMAL HIGH (ref 15–41)
Albumin: 2.3 g/dL — ABNORMAL LOW (ref 3.5–5.0)
Alkaline Phosphatase: 108 U/L (ref 38–126)
Anion gap: 7 (ref 5–15)
BUN: 8 mg/dL (ref 6–20)
CO2: 25 mmol/L (ref 22–32)
Calcium: 8.6 mg/dL — ABNORMAL LOW (ref 8.9–10.3)
Chloride: 105 mmol/L (ref 98–111)
Creatinine, Ser: 0.74 mg/dL (ref 0.61–1.24)
GFR calc Af Amer: >60 mL/min (ref >60)
GFR calc non Af Amer: >60 mL/min (ref >60)
Glucose, Bld: 112 mg/dL — ABNORMAL HIGH (ref 70–99)
Potassium: 4.1 mmol/L (ref 3.5–5.1)
Sodium: 137 mmol/L (ref 135–145)
Total Bilirubin: 0.5 mg/dL (ref 0.3–1.2)
Total Protein: 5.6 g/dL — ABNORMAL LOW (ref 6.5–8.1)

## 2020-02-28 LAB — CBC WITH DIFFERENTIAL/PLATELET
Abs Immature Granulocytes: 0.03 10*3/uL (ref 0.00–0.07)
Basophils Absolute: 0 10*3/uL (ref 0.0–0.1)
Basophils Relative: 0 %
Eosinophils Absolute: 0 10*3/uL (ref 0.0–0.5)
Eosinophils Relative: 0 %
HCT: 31.4 % — ABNORMAL LOW (ref 39.0–52.0)
Hemoglobin: 10.4 g/dL — ABNORMAL LOW (ref 13.0–17.0)
Immature Granulocytes: 1 %
Lymphocytes Relative: 22 %
Lymphs Abs: 1.4 10*3/uL (ref 0.7–4.0)
MCH: 29.1 pg (ref 26.0–34.0)
MCHC: 33.1 g/dL (ref 30.0–36.0)
MCV: 88 fL (ref 80.0–100.0)
Monocytes Absolute: 0.5 10*3/uL (ref 0.1–1.0)
Monocytes Relative: 8 %
Neutro Abs: 4.6 10*3/uL (ref 1.7–7.7)
Neutrophils Relative %: 69 %
Platelets: 163 10*3/uL (ref 150–400)
RBC: 3.57 MIL/uL — ABNORMAL LOW (ref 4.22–5.81)
RDW: 15.2 % (ref 11.5–15.5)
WBC: 6.6 10*3/uL (ref 4.0–10.5)
nRBC: 0 % (ref 0.0–0.2)

## 2020-02-28 LAB — MAGNESIUM: Magnesium: 1.9 mg/dL (ref 1.7–2.4)

## 2020-02-28 LAB — BRAIN NATRIURETIC PEPTIDE: B Natriuretic Peptide: 167.1 pg/mL — ABNORMAL HIGH (ref 0.0–100.0)

## 2020-02-28 LAB — C-REACTIVE PROTEIN: CRP: 1.3 mg/dL — ABNORMAL HIGH (ref <1.0)

## 2020-02-28 NOTE — Progress Notes (Signed)
PROGRESS NOTE                                                                                                                                                                                                             Patient Demographics:    Cameron Rogers, is a 54 y.o. male, DOB - Apr 15, 1966, HAL:[id number]  Outpatient Primary MD for the patient is Nicolette Bang, DO    LOS - 1  Admit date - 02/26/2020    Chief Complaint  Patient presents with  . Dizziness  . Weakness  . Nausea  . Urinary Frequency       Brief Narrative - 54 year old Caucasian male with history of alcohol use which he states now is intermittent to 3-4 times a week, no previously known medical problems who felt dizzy at a bus stop and then came to the hospital.  In the hospital there was concern of GI bleed with black-colored stool which was heme positive, he was found to be mildly anemic and incidentally positive for Covid.  He subsequently developed mild shortness of breath and was admitted.  In the ER he was also seen by GI and had a head CT and right upper quadrant ultrasound which were unremarkable.    Subjective:   Patient in bed, appears comfortable, denies any headache, no fever, no chest pain or pressure, no shortness of breath , no abdominal pain. No focal weakness.   Assessment  & Plan :     1. Acute Hypoxic Resp. Failure due to Acute Covid 19 Viral Pneumonitis during the ongoing 2020 Covid 19 Pandemic -he has mild disease and has been started on steroids and remdesivir combination, will continue to monitor closely.  He has mild disease but has been consented for Actemra use if needed.  Encouraged the patient to sit up in chair in the daytime use I-S and flutter valve for pulmonary toiletry and then prone in bed when at night.  Will advance activity and titrate down oxygen as possible.   SpO2: 97 %  Recent Labs  Lab  02/26/20 1218 02/26/20 1540 02/27/20 0616 02/28/20 0316  CRP  --  3.6* 3.1* 1.3*  DDIMER  --  0.56* 0.45  --   BNP  --   --  278.1* 167.1*  PROCALCITON  --  0.69  --   --  SARSCOV2NAA POSITIVE*  --   --   --     Hepatic Function Latest Ref Rng & Units 02/28/2020 02/27/2020 02/26/2020  Total Protein 6.5 - 8.1 g/dL 5.6(L) 6.1(L) 6.7  Albumin 3.5 - 5.0 g/dL 2.3(L) 2.4(L) 2.7(L)  AST 15 - 41 U/L 105(H) 139(H) 204(H)  ALT 0 - 44 U/L 66(H) 72(H) 85(H)  Alk Phosphatase 38 - 126 U/L 108 114 130(H)  Total Bilirubin 0.3 - 1.2 mg/dL 0.5 0.9 1.4(H)     2.  Ascitic anemia with heme positive stool.  BUN stable hence unlikely upper GI, continue PPI, check anemia panel, seen by GI.  Outpatient follow-up.  Currently no active symptoms or signs of active bleeding.  3.  Past history of alcohol abuse currently intermittent use.  Counseled to quit.  No signs of DTs will monitor closely, mild thrombocytopenia could be related to acute COVID-19 infection.  4.  Covidt induced thrombocytopenia continue to monitor.  Right upper quadrant ultrasound does not reveal any cirrhotic features.  5.  Mild hepatitis likely has history of alcohol abuse related.  AST is more than ALT, monitor right upper quadrant ultrasound unremarkable, INR stable suggesting stable synthetic liver function.     Condition - Fair  Family Communication  :  None  Code Status :  Full  Consults  :  GI  Procedures  :    RUQ [place] - Non acute  CT Head - Non acute  PUD Prophylaxis : PPI  Disposition Plan  :    Status is: Inpt  Dispo: The patient is from: Home              Anticipated d/c is to: Home              Anticipated d/c date is: > 3 days              Patient currently is not medically stable to d/c.  Getting treatment for COVID-19 infection with IV remdesivir course   DVT Prophylaxis  :   Heparin   Lab Results  Component Value Date   PLT 163 02/28/2020    Diet :  Diet Order            Diet regular Room  service appropriate? Yes; Fluid consistency: Thin  Diet effective now               Inpatient Medications  Scheduled Meds: . amLODipine  10 mg Oral Daily  . dexamethasone (DECADRON) injection  6 mg Intravenous Q24H  . folic acid  1 mg Oral Daily  . gabapentin  300 mg Oral QHS  . heparin injection (subcutaneous)  5,000 Units Subcutaneous Q8H  . multivitamin with minerals  1 tablet Oral Daily  . nicotine  14 mg Transdermal Daily  . pantoprazole  40 mg Oral QAC breakfast  . sodium chloride flush  3 mL Intravenous Q12H  . thiamine  100 mg Oral Daily   Continuous Infusions: . lactated ringers 50 mL/hr at 02/27/20 1417  . remdesivir 100 mg in NS 100 mL 100 mg (02/28/20 0822)   PRN Meds:.acetaminophen **OR** [DISCONTINUED] acetaminophen, hydrALAZINE, LORazepam **OR** LORazepam, [DISCONTINUED] ondansetron **OR** ondansetron (ZOFRAN) IV  Antibiotics  :    Anti-infectives (From admission, onward)   Start     Dose/Rate Route Frequency Ordered Stop   02/27/20 1600  remdesivir 100 mg in sodium chloride 0.9 % 100 mL IVPB     100 mg 200 mL/hr over 30 Minutes Intravenous Daily 02/26/20 1736 03/02/20  4193   02/26/20 1900  remdesivir 200 mg in sodium chloride 0.9% 250 mL IVPB     200 mg 580 mL/hr over 30 Minutes Intravenous Once 02/26/20 1736 02/26/20 2020       Time Spent in minutes  30   Lala Lund M.D on 02/28/2020 at 11:35 AM  To page go to [email or web address] - password [facility]  [facility] -  Office  [phone]  See all Orders from today for further details    Objective:   Vitals:   02/27/20 1620 02/27/20 2033 02/28/20 0515 02/28/20 0517  BP: 136/89 129/80  110/79  Pulse: 88 78 (!) 57 (!) 58  Resp: _0 Temp: 98 F (36.7 C) 98.3 F (36.8 C)  98.1 F (36.7 C)  TempSrc: Oral Oral  Oral  SpO2: 100% 98% 97% 97%  Weight:   78.3 kg   Height:        Wt Readings from Last 3 Encounters:  02/28/20 78.3 kg  07/12/19 79.6 kg  06/06/19 75.2 kg      Intake/Output Summary (Last 24 hours) at 02/28/2020 1135 Last data filed at 02/28/2020 0900 Gross per 24 hour  Intake 1652.13 ml  Output 700 ml  Net 952.13 ml     Physical Exam  Awake Alert, No new F.N deficits, Normal affect [place].AT,PERRAL Supple Neck,No JVD, No cervical lymphadenopathy appriciated.  Symmetrical Chest wall movement, Good air movement bilaterally, CTAB RRR,No Gallops,Rubs or new Murmurs, No Parasternal Heave +ve B.Sounds, Abd Soft, No tenderness, No organomegaly appriciated, No rebound - guarding or rigidity. No Cyanosis, Clubbing or edema, No new Rash or bruise      Data Review:    CBC Recent Labs  Lab 02/26/20 0938 02/26/20 1600 02/27/20 0616 02/28/20 0316  WBC 5.7 5.7 5.6 6.6  HGB 11.6* 11.5* 11.4* 10.4*  HCT 33.8* 34.0* 34.0* 31.4*  PLT 132* PLATELET CLUMPS NOTED ON SMEAR, COUNT APPEARS ADEQUATE 144* 163  MCV 87.6 87.9 86.7 88.0  MCH 30.1 29.7 29.1 29.1  MCHC 34.3 33.8 33.5 33.1  RDW 14.9 15.1 15.2 15.2  LYMPHSABS 1.2  --  1.2 1.4  MONOABS 0.6  --  0.4 0.5  EOSABS 0.0  --  0.0 0.0  BASOSABS 0.0  --  0.0 0.0    Chemistries  Recent Labs  Lab 02/26/20 0938 02/26/20 1230 02/27/20 0616 02/28/20 0316  NA 129*  --  136 137  K 3.6  --  3.9 4.1  CL 96*  --  103 105  CO2 21*  --  24 25  GLUCOSE 104*  --  107* 112*  BUN 11  --  <5* 8  CREATININE 1.14  --  0.74 0.74  CALCIUM 8.5*  --  8.5* 8.6*  AST 204*  --  139* 105*  ALT 85*  --  72* 66*  ALKPHOS 130*  --  114 108  BILITOT 1.4*  --  0.9 0.5  MG 2.0  --  2.0 1.9  INR  --  1.1  --   --      ------------------------------------------------------------------------------------------------------------------ No results for input(s): CHOL, HDL, LDLCALC, TRIG, CHOLHDL, LDLDIRECT in the last 72 hours.  Lab Results  Component Value Date   HGBA1C 5.9 (H) 04/26/2019   ------------------------------------------------------------------------------------------------------------------ No  results for input(s): TSH, T4TOTAL, T3FREE, THYROIDAB in the last 72 hours.  Invalid input(s): FREET3  Cardiac Enzymes No results for input(s): CKMB, TROPONINI, MYOGLOBIN in the last 168 hours.  Invalid input(s): CK ------------------------------------------------------------------------------------------------------------------  Component Value Date/Time   BNP 167.1 (H) 02/28/2020 0316    Micro Results Recent Results (from the past 240 hour(s))  SARS Coronavirus 2 by RT PCR (hospital order, performed in [facility] hospital lab) Nasopharyngeal Nasopharyngeal Swab     Status: Abnormal   Collection Time: 02/26/20 12:18 PM   Specimen: Nasopharyngeal Swab  Result Value Ref Range Status   SARS Coronavirus 2 POSITIVE (A) NEGATIVE Final    Comment: RESULT CALLED TO, READ BACK BY AND VERIFIED WITH: Gareth Eagle RN 14:20 02/26/20 ([email or web address]) (NOTE) SARS-CoV-2 target nucleic acids are DETECTED SARS-CoV-2 RNA is generally detectable in upper respiratory specimens  during the acute phase of infection.  Positive results are indicative  of the presence of the identified virus, but do not rule out bacterial infection or co-infection with other pathogens not detected by the test.  Clinical correlation with patient history and  other diagnostic information is necessary to determine patient infection status.  The expected result is negative. Fact Sheet for Patients:   StrictlyIdeas.no  Fact Sheet for Healthcare Providers:   BankingDealers.co.za   This test is not yet approved or cleared by the [place] FDA and  has been authorized for detection and/or diagnosis of SARS-CoV-2 by FDA under an Emergency Use Authorization (EUA).  This EUA will remain in effect (meaning this test c an be used) for the duration of  the COVID-19 declaration under Section 564(b)(1) of the Act, 21 U.S.C. section 360-bbb-3(b)(1), unless the authorization is terminated  or revoked sooner. Performed at [facility], [place] [address] [place], [place] [address]     Radiology Reports DG Chest 2 View  Result Date: 02/26/2020 CLINICAL DATA:  Weakness and dizziness EXAM: CHEST - 2 VIEW COMPARISON:  January 30, 2016 FINDINGS: There is a calcified granuloma in the left mid lung. The lungs elsewhere are clear. The heart size and pulmonary vascularity are normal. No adenopathy. No pneumothorax. No bone lesions. IMPRESSION: Calcified granuloma left mid lung. Lungs elsewhere clear. Cardiac silhouette within normal limits. Electronically Signed   By: Lowella Grip III M.D.   On: 02/26/2020 10:51   CT Head Wo Contrast  Result Date: 02/26/2020 CLINICAL DATA:  Vertigo, peripheral. Additional history provided: Sudden onset of dizziness, blurred vision earlier today, symptoms lasted for approximately 30-45 seconds, now improved but not resolved. EXAM: CT HEAD WITHOUT CONTRAST TECHNIQUE: Contiguous axial images were obtained from the base of the skull through the vertex without intravenous contrast. COMPARISON:  No pertinent prior studies available for comparison. FINDINGS: Brain: There is no acute intracranial hemorrhage. No demarcated cortical infarct. No extra-axial fluid collection. No evidence of intracranial mass. No midline shift. Vascular: No hyperdense vessel. Skull: Normal. Negative for fracture or focal lesion. Sinuses/Orbits: Visualized orbits show no acute finding. Air-fluid levels within the visualized bilateral maxillary sinuses. Mucosal thickening and scattered secretions within the bilateral ethmoid air cells. No significant mastoid effusion. IMPRESSION: 1. No evidence of acute intracranial abnormality. 2. Paranasal sinus disease as described. Correlate for acute sinusitis. Electronically Signed   By: Kellie Simmering DO   On: 02/26/2020 11:41   US Abdomen Limited  Result Date: 02/26/2020 CLINICAL DATA:  Elevated LFTs EXAM: ULTRASOUND ABDOMEN LIMITED RIGHT UPPER  QUADRANT COMPARISON:  None. FINDINGS: Gallbladder: Gallbladder is decompressed with thickened wall 4.4 mm. This is felt to be related to the decompression. No gallstones are seen. Negative sonographic Murphy's sign is noted. Common bile duct: Diameter: 3.7 mm. Liver: No focal lesion identified. Within normal limits in parenchymal  echogenicity. Portal vein is patent on color Doppler imaging with normal direction of blood flow towards the liver. Other: None. IMPRESSION: Decompressed gallbladder with wall thickening which is felt to be related to the decompression. No other focal abnormality is noted. Electronically Signed   By: Inez Catalina M.D.   On: 02/26/2020 15:09

## 2020-02-29 LAB — CBC WITH DIFFERENTIAL/PLATELET
Abs Immature Granulocytes: 0.04 10*3/uL (ref 0.00–0.07)
Basophils Absolute: 0 10*3/uL (ref 0.0–0.1)
Basophils Relative: 0 %
Eosinophils Absolute: 0 10*3/uL (ref 0.0–0.5)
Eosinophils Relative: 0 %
HCT: 31.7 % — ABNORMAL LOW (ref 39.0–52.0)
Hemoglobin: 10.6 g/dL — ABNORMAL LOW (ref 13.0–17.0)
Immature Granulocytes: 1 %
Lymphocytes Relative: 28 %
Lymphs Abs: 1.9 10*3/uL (ref 0.7–4.0)
MCH: 29 pg (ref 26.0–34.0)
MCHC: 33.4 g/dL (ref 30.0–36.0)
MCV: 86.8 fL (ref 80.0–100.0)
Monocytes Absolute: 0.5 10*3/uL (ref 0.1–1.0)
Monocytes Relative: 6 %
Neutro Abs: 4.6 10*3/uL (ref 1.7–7.7)
Neutrophils Relative %: 65 %
Platelets: 217 10*3/uL (ref 150–400)
RBC: 3.65 MIL/uL — ABNORMAL LOW (ref 4.22–5.81)
RDW: 15.4 % (ref 11.5–15.5)
WBC: 7 10*3/uL (ref 4.0–10.5)
nRBC: 0 % (ref 0.0–0.2)

## 2020-02-29 LAB — C-REACTIVE PROTEIN: CRP: 0.8 mg/dL (ref <1.0)

## 2020-02-29 LAB — COMPREHENSIVE METABOLIC PANEL
ALT: 65 U/L — ABNORMAL HIGH (ref 0–44)
AST: 75 U/L — ABNORMAL HIGH (ref 15–41)
Albumin: 2.6 g/dL — ABNORMAL LOW (ref 3.5–5.0)
Alkaline Phosphatase: 122 U/L (ref 38–126)
Anion gap: 8 (ref 5–15)
BUN: 8 mg/dL (ref 6–20)
CO2: 26 mmol/L (ref 22–32)
Calcium: 9.1 mg/dL (ref 8.9–10.3)
Chloride: 105 mmol/L (ref 98–111)
Creatinine, Ser: 0.64 mg/dL (ref 0.61–1.24)
GFR calc Af Amer: >60 mL/min (ref >60)
GFR calc non Af Amer: >60 mL/min (ref >60)
Glucose, Bld: 100 mg/dL — ABNORMAL HIGH (ref 70–99)
Potassium: 3.7 mmol/L (ref 3.5–5.1)
Sodium: 139 mmol/L (ref 135–145)
Total Bilirubin: 0.7 mg/dL (ref 0.3–1.2)
Total Protein: 6.5 g/dL (ref 6.5–8.1)

## 2020-02-29 LAB — MAGNESIUM: Magnesium: 1.8 mg/dL (ref 1.7–2.4)

## 2020-02-29 LAB — BRAIN NATRIURETIC PEPTIDE: B Natriuretic Peptide: 198.2 pg/mL — ABNORMAL HIGH (ref 0.0–100.0)

## 2020-02-29 NOTE — Progress Notes (Signed)
PROGRESS NOTE                                                                                                                                                                                                             Patient Demographics:    Cameron Rogers, is a 54 y.o. male, DOB - [birth year], WFU:[id number]  Outpatient Primary MD for the patient is Nicolette Bang, DO    LOS - 2  Admit date - 02/26/2020    Chief Complaint  Patient presents with  . Dizziness  . Weakness  . Nausea  . Urinary Frequency       Brief Narrative - 54 year old Caucasian male with history of alcohol use which he states now is intermittent to 3-4 times a week, no previously known medical problems who felt dizzy at a bus stop and then came to the hospital.  In the hospital there was concern of GI bleed with black-colored stool which was heme positive, he was found to be mildly anemic and incidentally positive for Covid.  He subsequently developed mild shortness of breath and was admitted.  In the ER he was also seen by GI and had a head CT and right upper quadrant ultrasound which were unremarkable.    Subjective:   Patient in bed, appears comfortable, denies any headache, no fever, no chest pain or pressure, no shortness of breath , no abdominal pain. No focal weakness.    Assessment  & Plan :     1. Acute Hypoxic Resp. Failure due to Acute Covid 19 Viral Pneumonitis during the ongoing 2020 Covid 19 Pandemic -he has mild disease and has been started on steroids and remdesivir combination, will continue to monitor closely.  He has mild disease but has been consented for Actemra use if needed.  Encouraged the patient to sit up in chair in the daytime use I-S and flutter valve for pulmonary toiletry and then prone in bed when at night.  Will advance activity and titrate down oxygen as possible.   SpO2: 99 %  Recent Labs  Lab  02/26/20 1218 02/26/20 1540 02/27/20 0616 02/28/20 0316 02/29/20 0814  CRP  --  3.6* 3.1* 1.3* 0.8  DDIMER  --  0.56* 0.45  --   --   BNP  --   --  278.1* 167.1*  --  PROCALCITON  --  0.69  --   --   --   SARSCOV2NAA POSITIVE*  --   --   --   --     Hepatic Function Latest Ref Rng & Units 02/29/2020 02/28/2020 02/27/2020  Total Protein 6.5 - 8.1 g/dL 6.5 5.6(L) 6.1(L)  Albumin 3.5 - 5.0 g/dL 2.6(L) 2.3(L) 2.4(L)  AST 15 - 41 U/L 75(H) 105(H) 139(H)  ALT 0 - 44 U/L 65(H) 66(H) 72(H)  Alk Phosphatase 38 - 126 U/L 122 108 114  Total Bilirubin 0.3 - 1.2 mg/dL 0.7 0.5 0.9     2.  Ascitic anemia with heme positive stool.  BUN stable hence unlikely upper GI, continue PPI, check anemia panel, seen by GI.  Outpatient follow-up.  Currently no active symptoms or signs of active bleeding.  3.  Past history of alcohol abuse currently intermittent use.  Counseled to quit.  No signs of DTs will monitor closely, mild thrombocytopenia could be related to acute COVID-19 infection.  4.  Covidt induced thrombocytopenia continue to monitor.  Right upper quadrant ultrasound does not reveal any cirrhotic features.  5.  Mild hepatitis likely has history of alcohol abuse related.  AST is more than ALT, monitor right upper quadrant ultrasound unremarkable, INR stable suggesting stable synthetic liver function.     Condition - Fair  Family Communication  :  None  Code Status :  Full  Consults  :  GI  Procedures  :    RUQ [place] - Non acute  CT Head - Non acute  PUD Prophylaxis : PPI  Disposition Plan  :    Status is: Inpt  Dispo: The patient is from: Home              Anticipated d/c is to: Home              Anticipated d/c date is: DC home 2020-03-01 once he has finished his IV remdesivir course.              Patient currently is not medically stable to d/c.  Getting treatment for COVID-19 infection with IV remdesivir course   DVT Prophylaxis  :   Heparin   Lab Results  Component Value  Date   PLT 217 02/29/2020    Diet :  Diet Order            Diet regular Room service appropriate? Yes; Fluid consistency: Thin  Diet effective now               Inpatient Medications  Scheduled Meds: . amLODipine  10 mg Oral Daily  . dexamethasone (DECADRON) injection  6 mg Intravenous Q24H  . folic acid  1 mg Oral Daily  . gabapentin  300 mg Oral QHS  . heparin injection (subcutaneous)  5,000 Units Subcutaneous Q8H  . multivitamin with minerals  1 tablet Oral Daily  . nicotine  14 mg Transdermal Daily  . pantoprazole  40 mg Oral QAC breakfast  . sodium chloride flush  3 mL Intravenous Q12H  . thiamine  100 mg Oral Daily   Continuous Infusions: . lactated ringers 50 mL/hr at 02/28/20 2048  . remdesivir 100 mg in NS 100 mL 100 mg (02/28/20 0822)   PRN Meds:.acetaminophen **OR** [DISCONTINUED] acetaminophen, hydrALAZINE, LORazepam **OR** LORazepam, [DISCONTINUED] ondansetron **OR** ondansetron (ZOFRAN) IV  Antibiotics  :    Anti-infectives (From admission, onward)   Start     Dose/Rate Route Frequency Ordered Stop   02/27/20 1600  remdesivir  100 mg in sodium chloride 0.9 % 100 mL IVPB     100 mg 200 mL/hr over 30 Minutes Intravenous Daily 02/26/20 1736 03/02/20 0959   02/26/20 1900  remdesivir 200 mg in sodium chloride 0.9% 250 mL IVPB     200 mg 580 mL/hr over 30 Minutes Intravenous Once 02/26/20 1736 02/26/20 2020       Time Spent in minutes  30   Lala Lund M.D on 02/29/2020 at 9:46 AM  To page go to [email or web address] - password [facility]  [facility] -  Office  [phone]  See all Orders from today for further details    Objective:   Vitals:   02/28/20 1200 02/28/20 2027 02/29/20 0505 02/29/20 0939  BP:  131/76 (!) 138/91 130/90  Pulse:  81 76   Resp: _0 Temp: 97.7 F (36.5 C) 98.1 F (36.7 C) (!) 97.4 F (36.3 C)   TempSrc: Oral Oral Oral   SpO2:  96% 99%   Weight:      Height:        Wt Readings from Last 3 Encounters:   02/28/20 78.3 kg  07/12/19 79.6 kg  06/06/19 75.2 kg     Intake/Output Summary (Last 24 hours) at 02/29/2020 0946 Last data filed at 02/29/2020 0937 Gross per 24 hour  Intake 480 ml  Output --  Net 480 ml     Physical Exam  Awake Alert, No new F.N deficits, Normal affect [place].AT,PERRAL Supple Neck,No JVD, No cervical lymphadenopathy appriciated.  Symmetrical Chest wall movement, Good air movement bilaterally, CTAB RRR,No Gallops, Rubs or new Murmurs, No Parasternal Heave +ve B.Sounds, Abd Soft, No tenderness, No organomegaly appriciated, No rebound - guarding or rigidity. No Cyanosis, Clubbing or edema, No new Rash or bruise   Data Review:    CBC Recent Labs  Lab 02/26/20 0938 02/26/20 1600 02/27/20 0616 02/28/20 0316 02/29/20 0814  WBC 5.7 5.7 5.6 6.6 7.0  HGB 11.6* 11.5* 11.4* 10.4* 10.6*  HCT 33.8* 34.0* 34.0* 31.4* 31.7*  PLT 132* PLATELET CLUMPS NOTED ON SMEAR, COUNT APPEARS ADEQUATE 144* 163 217  MCV 87.6 87.9 86.7 88.0 86.8  MCH 30.1 29.7 29.1 29.1 29.0  MCHC 34.3 33.8 33.5 33.1 33.4  RDW 14.9 15.1 15.2 15.2 15.4  LYMPHSABS 1.2  --  1.2 1.4 PENDING  MONOABS 0.6  --  0.4 0.5 PENDING  EOSABS 0.0  --  0.0 0.0 PENDING  BASOSABS 0.0  --  0.0 0.0 PENDING    Chemistries  Recent Labs  Lab 02/26/20 0938 02/26/20 1230 02/27/20 0616 02/28/20 0316 02/29/20 0814  NA 129*  --  136 137 139  K 3.6  --  3.9 4.1 3.7  CL 96*  --  103 105 105  CO2 21*  --  _1 GLUCOSE 104*  --  107* 112* 100*  BUN 11  --  <5* 8 8  CREATININE 1.14  --  0.74 0.74 0.64  CALCIUM 8.5*  --  8.5* 8.6* 9.1  AST 204*  --  139* 105* 75*  ALT 85*  --  72* 66* 65*  ALKPHOS 130*  --  114 108 122  BILITOT 1.4*  --  0.9 0.5 0.7  MG 2.0  --  2.0 1.9 1.8  INR  --  1.1  --   --   --      ------------------------------------------------------------------------------------------------------------------ No results for input(s): CHOL, HDL, LDLCALC, TRIG, CHOLHDL, LDLDIRECT in the last 72  hours.  Lab  Results  Component Value Date   HGBA1C 5.9 (H) 04/26/2019   ------------------------------------------------------------------------------------------------------------------ No results for input(s): TSH, T4TOTAL, T3FREE, THYROIDAB in the last 72 hours.  Invalid input(s): FREET3  Cardiac Enzymes No results for input(s): CKMB, TROPONINI, MYOGLOBIN in the last 168 hours.  Invalid input(s): CK ------------------------------------------------------------------------------------------------------------------    Component Value Date/Time   BNP 167.1 (H) 02/28/2020 0316    Micro Results Recent Results (from the past 240 hour(s))  SARS Coronavirus 2 by RT PCR (hospital order, performed in [facility] hospital lab) Nasopharyngeal Nasopharyngeal Swab     Status: Abnormal   Collection Time: 02/26/20 12:18 PM   Specimen: Nasopharyngeal Swab  Result Value Ref Range Status   SARS Coronavirus 2 POSITIVE (A) NEGATIVE Final    Comment: RESULT CALLED TO, READ BACK BY AND VERIFIED WITH: Gareth Eagle RN 14:20 02/26/20 ([email or web address]) (NOTE) SARS-CoV-2 target nucleic acids are DETECTED SARS-CoV-2 RNA is generally detectable in upper respiratory specimens  during the acute phase of infection.  Positive results are indicative  of the presence of the identified virus, but do not rule out bacterial infection or co-infection with other pathogens not detected by the test.  Clinical correlation with patient history and  other diagnostic information is necessary to determine patient infection status.  The expected result is negative. Fact Sheet for Patients:   StrictlyIdeas.no  Fact Sheet for Healthcare Providers:   BankingDealers.co.za   This test is not yet approved or cleared by the [place] FDA and  has been authorized for detection and/or diagnosis of SARS-CoV-2 by FDA under an Emergency Use Authorization (EUA).  This EUA will remain in  effect (meaning this test c an be used) for the duration of  the COVID-19 declaration under Section 564(b)(1) of the Act, 21 U.S.C. section 360-bbb-3(b)(1), unless the authorization is terminated or revoked sooner. Performed at [facility], [place] [address] [place], [place] [address]     Radiology Reports DG Chest 2 View  Result Date: 02/26/2020 CLINICAL DATA:  Weakness and dizziness EXAM: CHEST - 2 VIEW COMPARISON:  January 30, 2016 FINDINGS: There is a calcified granuloma in the left mid lung. The lungs elsewhere are clear. The heart size and pulmonary vascularity are normal. No adenopathy. No pneumothorax. No bone lesions. IMPRESSION: Calcified granuloma left mid lung. Lungs elsewhere clear. Cardiac silhouette within normal limits. Electronically Signed   By: Lowella Grip III M.D.   On: 02/26/2020 10:51   CT Head Wo Contrast  Result Date: 02/26/2020 CLINICAL DATA:  Vertigo, peripheral. Additional history provided: Sudden onset of dizziness, blurred vision earlier today, symptoms lasted for approximately 30-45 seconds, now improved but not resolved. EXAM: CT HEAD WITHOUT CONTRAST TECHNIQUE: Contiguous axial images were obtained from the base of the skull through the vertex without intravenous contrast. COMPARISON:  No pertinent prior studies available for comparison. FINDINGS: Brain: There is no acute intracranial hemorrhage. No demarcated cortical infarct. No extra-axial fluid collection. No evidence of intracranial mass. No midline shift. Vascular: No hyperdense vessel. Skull: Normal. Negative for fracture or focal lesion. Sinuses/Orbits: Visualized orbits show no acute finding. Air-fluid levels within the visualized bilateral maxillary sinuses. Mucosal thickening and scattered secretions within the bilateral ethmoid air cells. No significant mastoid effusion. IMPRESSION: 1. No evidence of acute intracranial abnormality. 2. Paranasal sinus disease as described. Correlate for acute  sinusitis. Electronically Signed   By: Kellie Simmering DO   On: 02/26/2020 11:41   US Abdomen Limited  Result Date: 02/26/2020 CLINICAL DATA:  Elevated LFTs EXAM:  ULTRASOUND ABDOMEN LIMITED RIGHT UPPER QUADRANT COMPARISON:  None. FINDINGS: Gallbladder: Gallbladder is decompressed with thickened wall 4.4 mm. This is felt to be related to the decompression. No gallstones are seen. Negative sonographic Murphy's sign is noted. Common bile duct: Diameter: 3.7 mm. Liver: No focal lesion identified. Within normal limits in parenchymal echogenicity. Portal vein is patent on color Doppler imaging with normal direction of blood flow towards the liver. Other: None. IMPRESSION: Decompressed gallbladder with wall thickening which is felt to be related to the decompression. No other focal abnormality is noted. Electronically Signed   By: Inez Catalina M.D.   On: 02/26/2020 15:09

## 2020-02-29 NOTE — Plan of Care (Signed)
Patient A&O x4. VSS. Pt on room air. Lungs sounds regular and unlabored. Free from falls. Air borne/contact precautions maintained. Pt turns self in bed. Ambulated to chair independently.  Ambulated in hallway. Pt tolerating fluids and meals.  Pt voiding adequately during shift.DVT prophylactic: heparin. No c/o of pain. Bed wheels locked. Phone and call bell within reach. Pt is resting, no distress. POC reviewed.    Problem: Education: Goal: Knowledge of General Education information will improve Description: Including pain rating scale, medication(s)/side effects and non-pharmacologic comfort measures Outcome: Progressing   Problem: Health Behavior/Discharge Planning: Goal: Ability to manage health-related needs will improve Outcome: Progressing   Problem: Clinical Measurements: Goal: Ability to maintain clinical measurements within normal limits will improve Outcome: Progressing Goal: Will remain free from infection Outcome: Progressing Goal: Diagnostic test results will improve Outcome: Progressing Goal: Respiratory complications will improve Outcome: Progressing Goal: Cardiovascular complication will be avoided Outcome: Progressing   Problem: Activity: Goal: Risk for activity intolerance will decrease Outcome: Progressing   Problem: Nutrition: Goal: Adequate nutrition will be maintained Outcome: Progressing   Problem: Coping: Goal: Level of anxiety will decrease Outcome: Progressing   Problem: Elimination: Goal: Will not experience complications related to bowel motility Outcome: Progressing Goal: Will not experience complications related to urinary retention Outcome: Progressing   Problem: Pain Managment: Goal: General experience of comfort will improve Outcome: Progressing   Problem: Safety: Goal: Ability to remain free from injury will improve Outcome: Progressing   Problem: Skin Integrity: Goal: Risk for impaired skin integrity will decrease Outcome:  Progressing

## 2020-03-01 MED ORDER — PANTOPRAZOLE SODIUM 40 MG PO TBEC: 40.0000 mg | DELAYED_RELEASE_TABLET | Freq: Every day | ORAL | 0 refills | Status: DC

## 2020-03-01 NOTE — Progress Notes (Signed)
[name] to be D/C'd Home per MD order.  Discussed with the patient and all questions fully answered.  VSS, Skin clean, dry and intact without evidence of skin break down, no evidence of skin tears noted. IV catheter discontinued intact. Site without signs and symptoms of complications. Dressing and pressure applied.  An After Visit Summary was printed and given to the patient. Patient received prescription.  D/c education completed with patient/family including follow up instructions, medication list, d/c activities limitations if indicated, with other d/c instructions as indicated by MD - patient able to verbalize understanding, all questions fully answered.   Patient instructed to return to ED, call 911, or call MD for any changes in condition.   Patient escorted via WC, and D/C home via private auto.  Cameron Rogers 03/01/2020 11:47 AM

## 2020-03-01 NOTE — Progress Notes (Signed)
                                      [facility]                            [address]                            [place], [place] [address]      Cameron Rogers was admitted to the Hospital on 02/26/2020 and Discharged  03/01/2020 and should be excused from work/school   for 14  days starting from date -  02/26/2020 , may return to work/school without any restrictions.  Call Susa Raring MD, [facility]  [phone] with questions.  Susa Raring M.D on 03/01/2020,at 11:37 AM  [facility]   Office  [phone]

## 2020-03-01 NOTE — Discharge Summary (Signed)
Cameron Rogers YIF:[id number] DOB: 09-Mar-1966 DOA: 02/26/2020  PCP: Nicolette Bang, DO  Admit date: 02/26/2020  Discharge date: 03/01/2020  Admitted From: Home   Disposition:  Home   Recommendations for Outpatient Follow-up:   Follow up with PCP in 1-2 weeks  PCP Please obtain BMP/CBC, 2 view CXR in 1week,  (see Discharge instructions)   PCP Please follow up on the following pending results: Monitor CBC, CMP, needs outpatient GI follow-up in 1 to 2 weeks for heme positive stool.   Home Health: None  Equipment/Devices: None  Consultations: [facility]  Discharge Condition: Stable    CODE STATUS: Full    Diet Recommendation: Heart Healthy   Diet Order            Diet - low sodium heart healthy        Diet regular Room service appropriate? Yes; Fluid consistency: Thin  Diet effective now               Chief Complaint  Patient presents with  . Dizziness  . Weakness  . Nausea  . Urinary Frequency     Brief history of present illness from the day of admission and additional interim summary    54 year old Caucasian male with history of alcohol use which he states now is intermittent to 3-4 times a week, no previously known medical problems who felt dizzy at a bus stop and then came to the hospital.  In the hospital there was concern of GI bleed with black-colored stool which was heme positive, he was found to be mildly anemic and incidentally positive for Covid.  He subsequently developed mild shortness of breath and was admitted.  In the ER he was also seen by GI and had a head CT and right upper quadrant ultrasound which were unremarkable.                                                                    Hospital Course    1. Acute Hypoxic Resp. Failure due to Acute Covid 19 Viral  Pneumonitis during the ongoing 2020 Covid 19 Pandemic - he had mild disease and was treated with steroids and remdesivir combination, now completely symptom-free back to baseline will be discharged home with PCP follow-up.  COVID-19 Labs  Recent Labs    02/28/20 0316 02/29/20 0814  CRP 1.3* 0.8    Lab Results  Component Value Date   SARSCOV2NAA POSITIVE (A) 02/26/2020     2.  Ascitic anemia with heme positive stool.  BUN stable hence unlikely upper GI, continue PPI, check anemia panel, seen by GI.  Outpatient follow-up.  Currently no active symptoms or signs of active bleeding.  Will be discharged on PPI.  3.  Past history of alcohol abuse currently intermittent use.  Counseled  to quit.  No signs of DTs will monitor closely, mild thrombocytopenia could be related to acute COVID-19 infection.  4.  Covidt induced thrombocytopenia continue to monitor.  Right upper quadrant ultrasound does not reveal any cirrhotic features.  Thrombocytopenia resolved.  5.  Mild hepatitis likely has history of alcohol abuse related.  AST is more than ALT, monitor right upper quadrant ultrasound unremarkable, INR stable suggesting stable synthetic liver function.  PCP to monitor CMP in the outpatient setting.   Discharge diagnosis     Principal Problem:   Acute upper GI bleed Active Problems:   Essential hypertension   Elevated PSA   Elevated liver function tests   Prediabetes   Alcohol abuse   COVID-19 virus infection    Discharge instructions    Discharge Instructions    Diet - low sodium heart healthy   Complete by: As directed    Discharge instructions   Complete by: As directed    Follow with Primary MD [name], DO in 7 days   Get CBC, CMP, 2 view Chest X ray -  checked next visit within 1 week by Primary MD   Activity: As tolerated with Full fall precautions use walker/cane & assistance as needed  Disposition Home   Diet: Heart Healthy   Special  Instructions: If you have smoked or chewed Tobacco  in the last 2 yrs please stop smoking, stop any regular Alcohol  and or any Recreational drug use.  On your next visit with your primary care physician please Get Medicines reviewed and adjusted.  Please request your Prim.MD to go over all Hospital Tests and Procedure/Radiological results at the follow up, please get all Hospital records sent to your Prim MD by signing hospital release before you go home.  If you experience worsening of your admission symptoms, develop shortness of breath, life threatening emergency, suicidal or homicidal thoughts you must seek medical attention immediately by calling 911 or calling your MD immediately  if symptoms less severe.  You Must read complete instructions/literature along with all the possible adverse reactions/side effects for all the Medicines you take and that have been prescribed to you. Take any new Medicines after you have completely understood and accpet all the possible adverse reactions/side effects.   Increase activity slowly   Complete by: As directed    MyChart COVID-19 home monitoring program   Complete by: Mar 01, 2020    Is the patient willing to use the MyChart Mobile App for home monitoring?: Yes   Temperature monitoring   Complete by: Mar 01, 2020    After how many days would you like to receive a notification of this patient's flowsheet entries?: 1      Discharge Medications   Allergies as of 03/01/2020   No Known Allergies     Medication List    TAKE these medications   amLODipine 10 MG tablet Commonly known as: NORVASC Take 1 tablet (10 mg total) by mouth daily.   gabapentin 300 MG capsule Commonly known as: NEURONTIN Take 1 capsule (300 mg total) by mouth at bedtime.   losartan 25 MG tablet Commonly known as: COZAAR Take 1 tablet by mouth once daily   pantoprazole 40 MG tablet Commonly known as: PROTONIX Take 1 tablet (40 mg total) by mouth daily before  breakfast. Start taking on: Mar 02, 2020       Follow-up Information    [name], DO. Schedule an appointment as soon as possible for a visit.  Specialty: Family Medicine Contact information: [address] [place] [address] [phone]        Sherrilyn Rist, MD. Schedule an appointment as soon as possible for a visit in 1 week(s).   Specialty: Gastroenterology Why: Hemoccult positive stool with possibility of melena Contact information: [address] [place] [address] [phone]           Major procedures and Radiology Reports - PLEASE review detailed and final reports thoroughly  -      DG Chest 2 View  Result Date: 02/26/2020 CLINICAL DATA:  Weakness and dizziness EXAM: CHEST - 2 VIEW COMPARISON:  January 30, 2016 FINDINGS: There is a calcified granuloma in the left mid lung. The lungs elsewhere are clear. The heart size and pulmonary vascularity are normal. No adenopathy. No pneumothorax. No bone lesions. IMPRESSION: Calcified granuloma left mid lung. Lungs elsewhere clear. Cardiac silhouette within normal limits. Electronically Signed   By: Bretta Bang III M.D.   On: 02/26/2020 10:51   CT Head Wo Contrast  Result Date: 02/26/2020 CLINICAL DATA:  Vertigo, peripheral. Additional history provided: Sudden onset of dizziness, blurred vision earlier today, symptoms lasted for approximately 30-45 seconds, now improved but not resolved. EXAM: CT HEAD WITHOUT CONTRAST TECHNIQUE: Contiguous axial images were obtained from the base of the skull through the vertex without intravenous contrast. COMPARISON:  No pertinent prior studies available for comparison. FINDINGS: Brain: There is no acute intracranial hemorrhage. No demarcated cortical infarct. No extra-axial fluid collection. No evidence of intracranial mass. No midline shift. Vascular: No hyperdense vessel. Skull: Normal. Negative for fracture or focal lesion. Sinuses/Orbits:  Visualized orbits show no acute finding. Air-fluid levels within the visualized bilateral maxillary sinuses. Mucosal thickening and scattered secretions within the bilateral ethmoid air cells. No significant mastoid effusion. IMPRESSION: 1. No evidence of acute intracranial abnormality. 2. Paranasal sinus disease as described. Correlate for acute sinusitis. Electronically Signed   By: Jackey Loge DO   On: 02/26/2020 11:41   US Abdomen Limited  Result Date: 02/26/2020 CLINICAL DATA:  Elevated LFTs EXAM: ULTRASOUND ABDOMEN LIMITED RIGHT UPPER QUADRANT COMPARISON:  None. FINDINGS: Gallbladder: Gallbladder is decompressed with thickened wall 4.4 mm. This is felt to be related to the decompression. No gallstones are seen. Negative sonographic Murphy's sign is noted. Common bile duct: Diameter: 3.7 mm. Liver: No focal lesion identified. Within normal limits in parenchymal echogenicity. Portal vein is patent on color Doppler imaging with normal direction of blood flow towards the liver. Other: None. IMPRESSION: Decompressed gallbladder with wall thickening which is felt to be related to the decompression. No other focal abnormality is noted. Electronically Signed   By: Alcide Clever M.D.   On: 02/26/2020 15:09    Micro Results    Recent Results (from the past 240 hour(s))  SARS Coronavirus 2 by RT PCR (hospital order, performed in [facility] hospital lab) Nasopharyngeal Nasopharyngeal Swab     Status: Abnormal   Collection Time: 02/26/20 12:18 PM   Specimen: Nasopharyngeal Swab  Result Value Ref Range Status   SARS Coronavirus 2 POSITIVE (A) NEGATIVE Final    Comment: RESULT CALLED TO, READ BACK BY AND VERIFIED WITH: Cherlyn Labella RN 14:20 02/26/20 ([email or web address]) (NOTE) SARS-CoV-2 target nucleic acids are DETECTED SARS-CoV-2 RNA is generally detectable in upper respiratory specimens  during the acute phase of infection.  Positive results are indicative  of the presence of the identified virus, but do not  rule out bacterial infection or co-infection with  other pathogens not detected by the test.  Clinical correlation with patient history and  other diagnostic information is necessary to determine patient infection status.  The expected result is negative. Fact Sheet for Patients:   BoilerBrush.com.cy  Fact Sheet for Healthcare Providers:   https://pope.com/   This test is not yet approved or cleared by the [place] FDA and  has been authorized for detection and/or diagnosis of SARS-CoV-2 by FDA under an Emergency Use Authorization (EUA).  This EUA will remain in effect (meaning this test c an be used) for the duration of  the COVID-19 declaration under Section 564(b)(1) of the Act, 21 U.S.C. section 360-bbb-3(b)(1), unless the authorization is terminated or revoked sooner. Performed at [facility], [address] [place], [place] [address]     Today   Subjective    Cameron Rogers today has no headache,no chest abdominal pain,no new weakness tingling or numbness, feels much better wants to go home today.    Objective   Blood pressure 120/76, pulse 75, temperature 98.2 F (36.8 C), temperature source Oral, resp. rate 20, height 6\' 2"  (1.88 m), weight 78.3 kg, SpO2 99 %.   Intake/Output Summary (Last 24 hours) at 03/01/2020 1032 Last data filed at 03/01/2020 0511 Gross per 24 hour  Intake 1320 ml  Output --  Net 1320 ml    Exam  Awake Alert, No new F.N deficits, Normal affect .AT,PERRAL Supple Neck,No JVD, No cervical lymphadenopathy appriciated.  Symmetrical Chest wall movement, Good air movement bilaterally, CTAB RRR,No Gallops,Rubs or new Murmurs, No Parasternal Heave +ve B.Sounds, Abd Soft, Non tender, No organomegaly appriciated, No rebound -guarding or rigidity. No Cyanosis, Clubbing or edema, No new Rash or bruise   Data Review   CBC w Diff:  Lab Results  Component Value Date   WBC 7.0 02/29/2020    HGB 10.6 (L) 02/29/2020   HGB 13.8 04/26/2019   HCT 31.7 (L) 02/29/2020   HCT 41.2 04/26/2019   PLT 217 02/29/2020   PLT 162 04/26/2019   LYMPHOPCT 28 02/29/2020   MONOPCT 6 02/29/2020   EOSPCT 0 02/29/2020   BASOPCT 0 02/29/2020    CMP:  Lab Results  Component Value Date   NA 139 02/29/2020   NA 139 04/26/2019   K 3.7 02/29/2020   CL 105 02/29/2020   CO2 26 02/29/2020   BUN 8 02/29/2020   BUN 13 04/26/2019   CREATININE 0.64 02/29/2020   PROT 6.5 02/29/2020   PROT 6.7 04/26/2019   ALBUMIN 2.6 (L) 02/29/2020   ALBUMIN 4.0 04/26/2019   BILITOT 0.7 02/29/2020   BILITOT 0.6 04/26/2019   ALKPHOS 122 02/29/2020   AST 75 (H) 02/29/2020   ALT 65 (H) 02/29/2020  .   Total Time in preparing paper work, data evaluation and todays exam - 35 minutes  03/02/2020 M.D on 03/01/2020 at 10:32 AM  [facility]   Office  [phone]

## 2020-03-01 NOTE — Discharge Instructions (Signed)
Follow with Primary MD [name], DO in 7 days   Get CBC, CMP, 2 view Chest X ray -  checked next visit within 1 week by Primary MD   Activity: As tolerated with Full fall precautions use walker/cane & assistance as needed  Disposition Home   Diet: Heart Healthy   Special Instructions: If you have smoked or chewed Tobacco  in the last 2 yrs please stop smoking, stop any regular Alcohol  and or any Recreational drug use.  On your next visit with your primary care physician please Get Medicines reviewed and adjusted.  Please request your Prim.MD to go over all Hospital Tests and Procedure/Radiological results at the follow up, please get all Hospital records sent to your Prim MD by signing hospital release before you go home.  If you experience worsening of your admission symptoms, develop shortness of breath, life threatening emergency, suicidal or homicidal thoughts you must seek medical attention immediately by calling 911 or calling your MD immediately  if symptoms less severe.  You Must read complete instructions/literature along with all the possible adverse reactions/side effects for all the Medicines you take and that have been prescribed to you. Take any new Medicines after you have completely understood and accpet all the possible adverse reactions/side effects.         Person Under Monitoring Name: Cameron Rogers  Location: [address] [place] [address]   Infection Prevention Recommendations for Individuals Confirmed to have, or Being Evaluated for, 2019 Novel Coronavirus (COVID-19) Infection Who Receive Care at Home  Individuals who are confirmed to have, or are being evaluated for, COVID-19 should follow the prevention steps below until a healthcare provider or local or state health department says they can return to normal activities.  Stay home except to get medical care You should restrict activities outside your home, except for  getting medical care. Do not go to work, school, or public areas, and do not use public transportation or taxis.  Call ahead before visiting your doctor Before your medical appointment, call the healthcare provider and tell them that you have, or are being evaluated for, COVID-19 infection. This will help the healthcare provider's office take steps to keep other people from getting infected. Ask your healthcare provider to call the local or state health department.  Monitor your symptoms Seek prompt medical attention if your illness is worsening (e.g., difficulty breathing). Before going to your medical appointment, call the healthcare provider and tell them that you have, or are being evaluated for, COVID-19 infection. Ask your healthcare provider to call the local or state health department.  Wear a facemask You should wear a facemask that covers your nose and mouth when you are in the same room with other people and when you visit a healthcare provider. People who live with or visit you should also wear a facemask while they are in the same room with you.  Separate yourself from other people in your home As much as possible, you should stay in a different room from other people in your home. Also, you should use a separate bathroom, if available.  Avoid sharing household items You should not share dishes, drinking glasses, cups, eating utensils, towels, bedding, or other items with other people in your home. After using these items, you should wash them thoroughly with soap and water.  Cover your coughs and sneezes Cover your mouth and nose with a tissue when you cough or sneeze, or you can cough  or sneeze into your sleeve. Throw used tissues in a lined trash can, and immediately wash your hands with soap and water for at least 20 seconds or use an alcohol-based hand rub.  Wash your [facility] your hands often and thoroughly with soap and water for at least 20 seconds. You can use  an alcohol-based hand sanitizer if soap and water are not available and if your hands are not visibly dirty. Avoid touching your eyes, nose, and mouth with unwashed hands.   Prevention Steps for Caregivers and Household Members of Individuals Confirmed to have, or Being Evaluated for, COVID-19 Infection Being Cared for in the Home  If you live with, or provide care at home for, a person confirmed to have, or being evaluated for, COVID-19 infection please follow these guidelines to prevent infection:  Follow healthcare provider's instructions Make sure that you understand and can help the patient follow any healthcare provider instructions for all care.  Provide for the patient's basic needs You should help the patient with basic needs in the home and provide support for getting groceries, prescriptions, and other personal needs.  Monitor the patient's symptoms If they are getting sicker, call his or her medical provider and tell them that the patient has, or is being evaluated for, COVID-19 infection. This will help the healthcare provider's office take steps to keep other people from getting infected. Ask the healthcare provider to call the local or state health department.  Limit the number of people who have contact with the patient  If possible, have only one caregiver for the patient.  Other household members should stay in another home or place of residence. If this is not possible, they should stay  in another room, or be separated from the patient as much as possible. Use a separate bathroom, if available.  Restrict visitors who do not have an essential need to be in the home.  Keep older adults, very young children, and other sick people away from the patient Keep older adults, very young children, and those who have compromised immune systems or chronic health conditions away from the patient. This includes people with chronic heart, lung, or kidney conditions, diabetes,  and cancer.  Ensure good ventilation Make sure that shared spaces in the home have good air flow, such as from an air conditioner or an opened window, weather permitting.  Wash your hands often  Wash your hands often and thoroughly with soap and water for at least 20 seconds. You can use an alcohol based hand sanitizer if soap and water are not available and if your hands are not visibly dirty.  Avoid touching your eyes, nose, and mouth with unwashed hands.  Use disposable paper towels to dry your hands. If not available, use dedicated cloth towels and replace them when they become wet.  Wear a facemask and gloves  Wear a disposable facemask at all times in the room and gloves when you touch or have contact with the patient's blood, body fluids, and/or secretions or excretions, such as sweat, saliva, sputum, nasal mucus, vomit, urine, or feces.  Ensure the mask fits over your nose and mouth tightly, and do not touch it during use.  Throw out disposable facemasks and gloves after using them. Do not reuse.  Wash your hands immediately after removing your facemask and gloves.  If your personal clothing becomes contaminated, carefully remove clothing and launder. Wash your hands after handling contaminated clothing.  Place all used disposable facemasks, gloves, and other  waste in a lined container before disposing them with other household waste.  Remove gloves and wash your hands immediately after handling these items.  Do not share dishes, glasses, or other household items with the patient  Avoid sharing household items. You should not share dishes, drinking glasses, cups, eating utensils, towels, bedding, or other items with a patient who is confirmed to have, or being evaluated for, COVID-19 infection.  After the person uses these items, you should wash them thoroughly with soap and water.  Wash laundry thoroughly  Immediately remove and wash clothes or bedding that have blood,  body fluids, and/or secretions or excretions, such as sweat, saliva, sputum, nasal mucus, vomit, urine, or feces, on them.  Wear gloves when handling laundry from the patient.  Read and follow directions on labels of laundry or clothing items and detergent. In general, wash and dry with the warmest temperatures recommended on the label.  Clean all areas the individual has used often  Clean all touchable surfaces, such as counters, tabletops, doorknobs, bathroom fixtures, toilets, phones, keyboards, tablets, and bedside tables, every day. Also, clean any surfaces that may have blood, body fluids, and/or secretions or excretions on them.  Wear gloves when cleaning surfaces the patient has come in contact with.  Use a diluted bleach solution (e.g., dilute bleach with 1 part bleach and 10 parts water) or a household disinfectant with a label that says EPA-registered for coronaviruses. To make a bleach solution at home, add 1 tablespoon of bleach to 1 quart (4 cups) of water. For a larger supply, add  cup of bleach to 1 gallon (16 cups) of water.  Read labels of cleaning products and follow recommendations provided on product labels. Labels contain instructions for safe and effective use of the cleaning product including precautions you should take when applying the product, such as wearing gloves or eye protection and making sure you have good ventilation during use of the product.  Remove gloves and wash hands immediately after cleaning.  Monitor yourself for signs and symptoms of illness Caregivers and household members are considered close contacts, should monitor their health, and will be asked to limit movement outside of the home to the extent possible. Follow the monitoring steps for close contacts listed on the symptom monitoring form.   ? If you have additional questions, contact your local health department or call the epidemiologist on call at [phone] (available 24/7). ? This  guidance is subject to change. For the most up-to-date guidance from [facility], please refer to their website: YouBlogs.pl

## 2020-03-02 ENCOUNTER — Telehealth: Payer: Self-pay

## 2020-03-02 NOTE — Telephone Encounter (Signed)
Transition Care Management Follow-up Telephone Call  Date of discharge and from where: 03/01/2020 [name]  Called pt  [phone]. Left voice message to call back. Name and phone nr provided.

## 2020-03-02 NOTE — Telephone Encounter (Signed)
COVID + / AS per AVS Instructions  pt  Needs  to have CBC, CMP and Chest X RAY within a week / Please add orders if required prior to your f /u appt.  Transition Care Management Follow-up Telephone Care  Date of discharge and from where: 03/01/2020 [facility]   How have you been since you were released from the hospital? Great  Any questions or concerns? NONE  Items Reviewed:  Did the pt receive and understand the discharge instructions provided? YES  Medications obtained and verified? YES  Any new allergies since your discharge? YES  Dietary orders reviewed? YES   Do you have support at home? Yes brother and sister will  Help when needed. Stressed the importance of CDC safety precautions. Verbalized understanding  Functional Questionnaire: (I = Independent and D = Dependent) ADLs: I   Follow up appointments reviewed:  PCP Hospital f/u appt confirmed?  Scheduled for tele health visit  With Dr Cameron Rogers on 03/09/2020 at10:50am [facility] f/u appt confirmed? None scheduled  Are transportation arrangements needed? NO If their condition worsens, /is the pt aware to call PCP or go to the Emergency Dept.? Pt is aware if condition is worsening or start experiencing any of diff, breathing, SOB, chest pain, rapid weight gain, severe uncontrolled pain, or visual disturbances to return to ED Was the patient provided with contact information for the PCP's office or ED? YES  Was to pt encouraged to call back with questions or concerns?YES name and contact information given.

## 2020-03-09 ENCOUNTER — Ambulatory Visit: Payer: BC Managed Care – PPO

## 2020-03-09 ENCOUNTER — Telehealth (INDEPENDENT_AMBULATORY_CARE_PROVIDER_SITE_OTHER): Payer: BC Managed Care – PPO | Admitting: Internal Medicine

## 2020-03-09 ENCOUNTER — Encounter: Payer: Self-pay | Admitting: Internal Medicine

## 2020-03-09 DIAGNOSIS — R195 Other fecal abnormalities: Secondary | ICD-10-CM

## 2020-03-09 DIAGNOSIS — U071 COVID-19: Secondary | ICD-10-CM

## 2020-03-09 DIAGNOSIS — K7010 Alcoholic hepatitis without ascites: Secondary | ICD-10-CM | POA: Diagnosis not present

## 2020-03-09 DIAGNOSIS — D696 Thrombocytopenia, unspecified: Secondary | ICD-10-CM

## 2020-03-09 DIAGNOSIS — J9601 Acute respiratory failure with hypoxia: Secondary | ICD-10-CM | POA: Diagnosis not present

## 2020-03-09 DIAGNOSIS — Z09 Encounter for follow-up examination after completed treatment for conditions other than malignant neoplasm: Secondary | ICD-10-CM | POA: Diagnosis not present

## 2020-03-09 NOTE — Progress Notes (Signed)
Virtual Visit via Telephone Note  I connected with [name], on 03/09/2020 at 10:45 AM by telephone due to the COVID-19 pandemic and verified that I am speaking with the correct person using two identifiers.   Consent: I discussed the limitations, risks, security and privacy concerns of performing an evaluation and management service by telephone and the availability of in person appointments. I also discussed with the patient that there may be a patient responsible charge related to this service. The patient expressed understanding and agreed to proceed.   Location of Patient: Home   Location of Provider: Clinic    Persons participating in Telemedicine visit: [facility] Dr. Earlene Plater      History of Present Illness: Patient has a hospital discharge follow up. Patient is an extremely poor historian. Patient reports that he is feeling well. He is still using O2, he is unable to tell me how many liters he is using. He does not monitor pulse ox.    He does not believe he has GI follow up scheduled. Still having some darker stools.     Hospital Course Summary:   1. Acute Hypoxic Resp. Failure due to Acute Covid 19 Viral Pneumonitis during the ongoing 2020 Covid 19 Pandemic - he had mild disease and was treated with steroids and remdesivir combination, now completely symptom-free back to baseline will be discharged home with PCP follow-up.  COVID-19 Labs  Recent Labs (last 2 labs)       Recent Labs    02/28/20 0316 02/29/20 0814  CRP 1.3* 0.8      Recent Labs       Lab Results  Component Value Date   SARSCOV2NAA POSITIVE (A) 02/26/2020       2. Ascitic anemia with heme positive stool. BUN stable hence unlikely upper GI, continue PPI, check anemia panel, seen by GI. Outpatient follow-up. Currently no active symptoms or signs of active bleeding.  Will be discharged on PPI.  3. Past history of alcohol abuse currently intermittent  use. Counseled to quit. No signs of DTs will monitor closely, mild thrombocytopenia could be related to acute COVID-19 infection.  4. Covidt induced thrombocytopenia continue to monitor. Right upper quadrant ultrasound does not reveal any cirrhotic features.  Thrombocytopenia resolved.  5. Mild hepatitis likely has history of alcohol abuse related. AST is more than ALT, monitor right upper quadrant ultrasound unremarkable, INR stable suggesting stable synthetic liver function.  PCP to monitor CMP in the outpatient setting.  Past Medical History:  Diagnosis Date  . Allergy    SEASONAL  . Arthritis   . Heart murmur   . Hypertension    No Known Allergies  Current Outpatient Medications on File Prior to Visit  Medication Sig Dispense Refill  . amLODipine (NORVASC) 10 MG tablet Take 1 tablet (10 mg total) by mouth daily. 90 tablet 0  . gabapentin (NEURONTIN) 300 MG capsule Take 1 capsule (300 mg total) by mouth at bedtime. 30 capsule 6  . losartan (COZAAR) 25 MG tablet Take 1 tablet by mouth once daily 90 tablet 0  . pantoprazole (PROTONIX) 40 MG tablet Take 1 tablet (40 mg total) by mouth daily before breakfast. 30 tablet 0  . [DISCONTINUED] hydrochlorothiazide (HYDRODIURIL) 25 MG tablet Take 1 tablet (25 mg total) by mouth daily. 90 tablet 1  . [DISCONTINUED] omeprazole (PRILOSEC) 20 MG capsule Take 1 capsule (20 mg total) by mouth daily. 10 capsule 0  . [DISCONTINUED] promethazine (PHENERGAN) 25 MG tablet Take 1 tablet (  25 mg total) by mouth every 8 (eight) hours as needed for nausea or vomiting. 8 tablet 0   No current facility-administered medications on file prior to visit.    Observations/Objective: NAD. Speaking clearly.  Work of breathing normal.  Alert and oriented. Mood appropriate.   Assessment and Plan: 1. Hospital discharge follow-up  2. COVID-19 virus infection Respiratory status stable and patient asymptomatic.  - CBC; Future - DG Chest 2 View; Future  3.  Acute respiratory failure with hypoxia ([place]) Will repeat CXR. Will need to determine if can wean off home O2.  - DG Chest 2 View; Future  4. Alcoholic hepatitis without ascites AST 75/ALT 65 at time of discharge. Will repeat to monitor.  - Comprehensive metabolic panel; Future  5. Thrombocytopenia (HCC) Platelets 132, normalized at 217 at discharge. Likely related to COVID-19 infection. Will repeat and ensure has remained stable.  - CBC; Future  6. Heme positive stool - Ambulatory referral to Gastroenterology   Follow Up Instructions: Lab visit 5/28    I discussed the assessment and treatment plan with the patient. The patient was provided an opportunity to ask questions and all were answered. The patient agreed with the plan and demonstrated an understanding of the instructions.   The patient was advised to call back or seek an in-person evaluation if the symptoms worsen or if the condition fails to improve as anticipated.     I provided 22 minutes total of non-face-to-face time during this encounter including median intraservice time, reviewing previous notes, investigations, ordering medications, medical decision making, coordinating care and patient verbalized understanding at the end of the visit.    Phill Myron, D.O. Primary Care at [facility]  03/09/2020, 10:45 AM

## 2020-03-13 ENCOUNTER — Ambulatory Visit: Payer: BC Managed Care – PPO

## 2020-03-17 ENCOUNTER — Telehealth: Payer: Self-pay | Admitting: Gastroenterology

## 2020-03-17 NOTE — Telephone Encounter (Signed)
We saw this patient for elevated LFTs and anemia during recent hospital stay.  Please contact him and arrange office follow up with me or an APP. (I seem to recall he may need Fridays since that is his day off)  - HD

## 2020-03-19 NOTE — Telephone Encounter (Signed)
Left message to return call 

## 2020-03-20 ENCOUNTER — Encounter: Payer: Self-pay | Admitting: Gastroenterology

## 2020-03-23 NOTE — Telephone Encounter (Signed)
Patient is scheduled for follow up with Dr Myrtie Neither on 05/08/20.

## 2020-03-27 ENCOUNTER — Ambulatory Visit (INDEPENDENT_AMBULATORY_CARE_PROVIDER_SITE_OTHER): Payer: BC Managed Care – PPO

## 2020-03-27 ENCOUNTER — Other Ambulatory Visit: Payer: Self-pay

## 2020-03-27 DIAGNOSIS — J9601 Acute respiratory failure with hypoxia: Secondary | ICD-10-CM

## 2020-03-27 DIAGNOSIS — K7010 Alcoholic hepatitis without ascites: Secondary | ICD-10-CM

## 2020-03-27 DIAGNOSIS — D696 Thrombocytopenia, unspecified: Secondary | ICD-10-CM

## 2020-03-27 DIAGNOSIS — U071 COVID-19: Secondary | ICD-10-CM

## 2020-03-27 DIAGNOSIS — J90 Pleural effusion, not elsewhere classified: Secondary | ICD-10-CM | POA: Diagnosis not present

## 2020-03-27 LAB — CBC
Hematocrit: 33.8 % — ABNORMAL LOW (ref 37.5–51.0)
Hemoglobin: 11.7 g/dL — ABNORMAL LOW (ref 13.0–17.7)
MCH: 31.2 pg (ref 26.6–33.0)
MCHC: 34.6 g/dL (ref 31.5–35.7)
MCV: 90 fL (ref 79–97)
Platelets: 233 10*3/uL (ref 150–450)
RBC: 3.75 x10E6/uL — ABNORMAL LOW (ref 4.14–5.80)
RDW: 15.3 % (ref 11.6–15.4)
WBC: 7.2 10*3/uL (ref 3.4–10.8)

## 2020-03-27 NOTE — Progress Notes (Signed)
Patient here for chest xray & labs.

## 2020-03-28 LAB — COMPREHENSIVE METABOLIC PANEL
ALT: 28 IU/L (ref 0–44)
AST: 48 IU/L — ABNORMAL HIGH (ref 0–40)
Albumin/Globulin Ratio: 1.4 (ref 1.2–2.2)
Albumin: 4.5 g/dL (ref 3.8–4.9)
Alkaline Phosphatase: 154 IU/L — ABNORMAL HIGH (ref 48–121)
BUN/Creatinine Ratio: 15 (ref 9–20)
BUN: 11 mg/dL (ref 6–24)
Bilirubin Total: 0.4 mg/dL (ref 0.0–1.2)
CO2: 18 mmol/L — ABNORMAL LOW (ref 20–29)
Calcium: 9.4 mg/dL (ref 8.7–10.2)
Chloride: 104 mmol/L (ref 96–106)
Creatinine, Ser: 0.74 mg/dL — ABNORMAL LOW (ref 0.76–1.27)
GFR calc Af Amer: 122 mL/min/{1.73_m2} (ref >59)
GFR calc non Af Amer: 105 mL/min/{1.73_m2} (ref >59)
Globulin, Total: 3.2 g/dL (ref 1.5–4.5)
Glucose: 91 mg/dL (ref 65–99)
Potassium: 4 mmol/L (ref 3.5–5.2)
Sodium: 140 mmol/L (ref 134–144)
Total Protein: 7.7 g/dL (ref 6.0–8.5)

## 2020-04-17 NOTE — Progress Notes (Signed)
Patient notified of results & recommendations. Expressed understanding. States that he hasn't noticed any blood in his stools. Is agreeable to started iron supplement.

## 2020-04-17 NOTE — Progress Notes (Signed)
Patient notified of results & recommendations. Expressed understanding. States that he is still using the incentive spirometry every hour. Scheduled repeat chest xray for 05/15/2020.

## 2020-05-08 ENCOUNTER — Encounter: Payer: Self-pay | Admitting: Gastroenterology

## 2020-05-08 ENCOUNTER — Ambulatory Visit (INDEPENDENT_AMBULATORY_CARE_PROVIDER_SITE_OTHER): Payer: BC Managed Care – PPO | Admitting: Gastroenterology

## 2020-05-08 ENCOUNTER — Other Ambulatory Visit (INDEPENDENT_AMBULATORY_CARE_PROVIDER_SITE_OTHER): Payer: BC Managed Care – PPO

## 2020-05-08 VITALS — BP 130/84 | HR 95 | Ht 71.5 in | Wt 174.5 lb

## 2020-05-08 DIAGNOSIS — R7989 Other specified abnormal findings of blood chemistry: Secondary | ICD-10-CM

## 2020-05-08 DIAGNOSIS — K625 Hemorrhage of anus and rectum: Secondary | ICD-10-CM

## 2020-05-08 LAB — HEPATIC FUNCTION PANEL
ALT: 37 U/L (ref 0–53)
AST: 62 U/L — ABNORMAL HIGH (ref 0–37)
Albumin: 4.5 g/dL (ref 3.5–5.2)
Alkaline Phosphatase: 141 U/L — ABNORMAL HIGH (ref 39–117)
Bilirubin, Direct: 0.1 mg/dL (ref 0.0–0.3)
Total Bilirubin: 0.5 mg/dL (ref 0.2–1.2)
Total Protein: 7.8 g/dL (ref 6.0–8.3)

## 2020-05-08 NOTE — Patient Instructions (Signed)
If you are age 54 or older, your body mass index should be between 23-30. Your Body mass index is 24 kg/m. If this is out of the aforementioned range listed, please consider follow up with your Primary Care Provider.  If you are age 60 or younger, your body mass index should be between 19-25. Your Body mass index is 24 kg/m. If this is out of the aformentioned range listed, please consider follow up with your Primary Care Provider.   You have been scheduled for a colonoscopy. Please follow written instructions given to you at your visit today.  Please pick up your prep supplies at the pharmacy within the next 1-3 days. If you use inhalers (even only as needed), please bring them with you on the day of your procedure.  Your provider has requested that you go to the basement level for lab work before leaving today. Press "B" on the elevator. The lab is located at the first door on the left as you exit the elevator.  Due to recent changes in healthcare laws, you may see the results of your imaging and laboratory studies on MyChart before your provider has had a chance to review them.  We understand that in some cases there may be results that are confusing or concerning to you. Not all laboratory results come back in the same time frame and the provider may be waiting for multiple results in order to interpret others.  Please give [place] 48 hours in order for your provider to thoroughly review all the results before contacting the office for clarification of your results.   It was a pleasure to see you today!  Dr. Myrtie Neither

## 2020-05-08 NOTE — Progress Notes (Signed)
[facility] Progress Note  Chief Complaint: Rectal bleeding  Subjective  History: Inpatient consult 02/26/2020 for single episode of painless rectal bleeding, primarily blood on the paper.  He had a mild anemia and thrombocytopenia as well as elevated LFTs.  History of probable excess alcohol intake, at least episodically.  He was Covid positive, no endoscopic testing was done.  Positive hepatitis B surface antibody with negative surface antigen consistent with prior vaccination.  K[place]on follows up today after the hospitalization.  I reminded him that I had seen him once during that hospitalization, then he gave me a searching look, then seem to finally recall that he had been hospitalized and said "oh yeah, COVID-19".  He only saw me once, so had not recalled my visit with him. He believes the rectal bleeding reported at the time was from a brief episode of constipation with some straining.  I also brought up the issue of his LFTs and alcohol, and he has 2 or 3 drinks every day after work, which has been his pattern for many years.  Bowel habits have been regular lately, no abdominal pain, dysphagia, nausea or vomiting.  Child seems somewhat distracted at the visit today, at times was a little unsteady when he stood up.  He does not drive, and told me that his brother brought him for today's visit.  ROS: Cardiovascular:  no chest pain Respiratory: no dyspnea  The patient's Past Medical, Family and Social History were reviewed and are on file in the EMR.  Objective:  Med list reviewed  Current Outpatient Medications:  .  gabapentin (NEURONTIN) 300 MG capsule, Take 1 capsule (300 mg total) by mouth at bedtime., Disp: 30 capsule, Rfl: 6 .  losartan (COZAAR) 25 MG tablet, Take 1 tablet by mouth once daily, Disp: 90 tablet, Rfl: 0 .  Multiple Vitamin (MULTIVITAMIN) tablet, Take 1 tablet by mouth daily., Disp: , Rfl:    Vital signs in last 24 hrs: Vitals:   05/08/20 1503    BP: (!) 130/84  Pulse: 95  SpO2: 97%    Physical Exam  He is talking quite loudly at the office visit today.  HEENT: sclera anicteric, oral mucosa moist without lesions  Neck: supple, no thyromegaly, JVD or lymphadenopathy  Cardiac: RRR without murmurs, S1S2 heard, no peripheral edema  Pulm: clear to auscultation bilaterally, normal RR and effort noted  Abdomen: soft, no tenderness, with active bowel sounds. No guarding or palpable hepatosplenomegaly.  Skin; warm and dry, no jaundice or rash  Labs:  CBC Latest Ref Rng & Units 03/27/2020 02/29/2020 02/28/2020  WBC 3.4 - 10.8 x10E3/uL 7.2 7.0 6.6  Hemoglobin 13.0 - 17.7 g/dL 11.7(L) 10.6(L) 10.4(L)  Hematocrit 37.5 - 51.0 % 33.8(L) 31.7(L) 31.4(L)  Platelets 150 - 450 x10E3/uL 233 217 163   CMP Latest Ref Rng & Units 05/08/2020 03/27/2020 02/29/2020  Glucose 65 - 99 mg/dL - 91 659(D)  BUN 6 - 24 mg/dL - 11 8  Creatinine 3.57 - 1.27 mg/dL - 0.17(B) 9.39  Sodium 134 - 144 mmol/L - 140 139  Potassium 3.5 - 5.2 mmol/L - 4.0 3.7  Chloride 96 - 106 mmol/L - 104 105  CO2 20 - 29 mmol/L - 18(L) 26  Calcium 8.7 - 10.2 mg/dL - 9.4 9.1  Total Protein 6.0 - 8.3 g/dL 7.8 7.7 6.5  Total Bilirubin 0.2 - 1.2 mg/dL 0.5 0.4 0.7  Alkaline Phos 39 - 117 U/L 141(H) 154(H) 122  AST 0 - 37 U/L 62(H)  48(H) 75(H)  ALT 0 - 53 U/L 37 28 65(H)    ___________________________________________ Radiologic studies:   ____________________________________________ Other:   _____________________________________________ Assessment & Plan  Assessment: Encounter Diagnoses  Name Primary?  . Rectal bleeding Yes  . LFT elevation    Single episode of painless rectal bleeding, limited historian.  May very well have been benign and related constipation.  He is overdue for screening colonoscopy, and it was scheduled today.  He asked me to speak with his sister about it, so he called her during the office visit, put her on speaker phone.  We briefly discussed  it and she said she was in agreement and had been trying to get him to have a colonoscopy.  Elevated LFTs are from alcohol use, which I believe to be excessive.  I recommended he cut back alcohol and even consider stopping altogether for his long-term liver health.   Plan: Colonoscopy scheduled today.  He was agreeable after discussion of procedure and risks.  The benefits and risks of the planned procedure were described in detail with the patient or (when appropriate) their health care proxy.  Risks were outlined as including, but not limited to, bleeding, infection, perforation, adverse medication reaction leading to cardiac or pulmonary decompensation, pancreatitis (if ERCP).  The limitation of incomplete mucosal visualization was also discussed.  No guarantees or warranties were given.  LFTs and alcohol level today.  25 minutes were spent on this encounter (including chart review, history/exam, counseling/coordination of care, and documentation)  Charlie Pitter III

## 2020-05-13 ENCOUNTER — Other Ambulatory Visit: Payer: Self-pay

## 2020-05-13 ENCOUNTER — Telehealth: Payer: Self-pay

## 2020-05-13 ENCOUNTER — Ambulatory Visit
Admission: EM | Admit: 2020-05-13 | Discharge: 2020-05-13 | Disposition: A | Discharge status: Home / Self Care | Payer: BC Managed Care – PPO

## 2020-05-13 DIAGNOSIS — R5383 Other fatigue: Secondary | ICD-10-CM | POA: Diagnosis not present

## 2020-05-13 DIAGNOSIS — I10 Essential (primary) hypertension: Secondary | ICD-10-CM

## 2020-05-13 LAB — CBC WITH DIFFERENTIAL/PLATELET
Basophils Absolute: 0 10*3/uL (ref 0.0–0.2)
Basos: 1 %
EOS (ABSOLUTE): 0.1 10*3/uL (ref 0.0–0.4)
Eos: 1 %
Hematocrit: 38.8 % (ref 37.5–51.0)
Hemoglobin: 13.3 g/dL (ref 13.0–17.7)
Lymphocytes Absolute: 2 10*3/uL (ref 0.7–3.1)
Lymphs: 37 %
MCH: 30.6 pg (ref 26.6–33.0)
MCHC: 34.3 g/dL (ref 31.5–35.7)
MCV: 89 fL (ref 79–97)
Monocytes Absolute: 0.6 10*3/uL (ref 0.1–0.9)
Monocytes: 11 %
Neutrophils Absolute: 2.7 10*3/uL (ref 1.4–7.0)
Neutrophils: 50 %
Platelets: 210 10*3/uL (ref 150–450)
RBC: 4.35 x10E6/uL (ref 4.14–5.80)
RDW: 14.1 % (ref 11.6–15.4)
WBC: 5.3 10*3/uL (ref 3.4–10.8)

## 2020-05-13 MED ORDER — LOSARTAN POTASSIUM 25 MG PO TABS: 25.0000 mg | ORAL_TABLET | Freq: Every day | ORAL | 0 refills | Status: DC

## 2020-05-13 NOTE — ED Provider Notes (Signed)
[name]    CSN: [id number] Arrival date & time: 05/13/20  3536      History   Chief Complaint Chief Complaint  Patient presents with  . Fatigue    HPI Cameron Rogers is a 54 y.o. male.   54 year old male with PMH as below comes in for 2 day history of generalized fatigue. States history of anemia and felt the same way then. He denies any melena, hematochezia, hematuria, hematemesis. Denies abdominal pain, nausea, vomiting. Denies URI symptoms, fever. Denies urinary changes. Denies rashes, tick bites. Denies chest pain, shortness of breath, syncope.     Past Medical History:  Diagnosis Date  . Allergy    SEASONAL  . Arthritis   . Heart murmur   . Hypertension     Patient Active Problem List   Diagnosis Date Noted  . Acute upper GI bleed 02/26/2020  . COVID-19 virus infection 02/26/2020  . Essential hypertension 11/28/2019  . Neuropathy, alcoholic (HCC) 11/28/2019  . Elevated PSA 11/28/2019  . Elevated liver function tests 11/28/2019  . Prediabetes 11/28/2019  . Alcohol abuse 11/28/2019    Past Surgical History:  Procedure Laterality Date  . NO PAST SURGERIES         Home Medications    Prior to Admission medications   Medication Sig Start Date End Date Taking? Authorizing Provider  ferrous sulfate 325 (65 FE) MG tablet Take 325 mg by mouth daily with breakfast.   Yes [provider]  gabapentin (NEURONTIN) 300 MG capsule Take 1 capsule (300 mg total) by mouth at bedtime. 06/06/19   [name], MD  losartan (COZAAR) 25 MG tablet Take 1 tablet by mouth once daily 02/17/20   [name], DO  Multiple Vitamin (MULTIVITAMIN) tablet Take 1 tablet by mouth daily.    [provider]  hydrochlorothiazide (HYDRODIURIL) 25 MG tablet Take 1 tablet (25 mg total) by mouth daily. 06/02/16 04/02/19  [name], PA-C  omeprazole (PRILOSEC) 20 MG capsule Take 1 capsule (20 mg total) by mouth daily. 04/25/17 04/02/19   [name], MD  promethazine (PHENERGAN) 25 MG tablet Take 1 tablet (25 mg total) by mouth every 8 (eight) hours as needed for nausea or vomiting. 04/25/17 04/02/19  [name], MD    Family History Family History  Problem Relation Age of Onset  . Diabetes Mother   . Breast cancer Sister   . Diabetes Sister   . Heart murmur Sister   . Diabetes Brother   . Colon cancer Neg Hx   . Colon polyps Neg Hx   . Esophageal cancer Neg Hx   . Rectal cancer Neg Hx   . Stomach cancer Neg Hx   . Ovarian cancer Neg Hx     Social History Social History   Tobacco Use  . Smoking status: Current Every Day Smoker    Packs/day: 0.50    Years: 38.00    Pack years: 19.00    Types: Cigarettes  . Smokeless tobacco: Never Used  Vaping Use  . Vaping Use: Never used  Substance Use Topics  . Alcohol use: Yes    Comment: 3- 40 ounce beers daily;  h/o heavy use, decreased use about a year ago  . Drug use: No     Allergies   Patient has no known allergies.   Review of Systems Review of Systems  Reason unable to perform ROS: See HPI as above.     Physical Exam Triage Vital Signs ED Triage  Vitals [05/13/20 0907]  Enc Vitals Group     BP (!) 165/101     Pulse Rate 91     Resp 18     Temp 98.3 F (36.8 C)     Temp Source Oral     SpO2 96 %     Weight      Height      Head Circumference      Peak Flow      Pain Score 0     Pain Loc      Pain Edu?      Excl. in GC?    No data found.  Updated Vital Signs BP (!) 165/101 (BP Location: Left Arm)   Pulse 91   Temp 98.3 F (36.8 C) (Oral)   Resp 18   SpO2 96%   Physical Exam Constitutional:      General: He is not in acute distress.    Appearance: Normal appearance. He is well-developed. He is not toxic-appearing or diaphoretic.  HENT:     Head: Normocephalic and atraumatic.  Eyes:     Conjunctiva/sclera: Conjunctivae normal.     Pupils: Pupils are equal, round, and reactive to light.  Cardiovascular:     Rate  and Rhythm: Normal rate and regular rhythm.     Heart sounds: No murmur heard.  No friction rub. No gallop.   Pulmonary:     Effort: Pulmonary effort is normal. No respiratory distress.     Comments: LCTAB Abdominal:     General: Bowel sounds are normal.     Palpations: Abdomen is soft.     Tenderness: There is no abdominal tenderness. There is no guarding or rebound.  Musculoskeletal:     Cervical back: Normal range of motion and neck supple.  Skin:    General: Skin is warm and dry.     Coloration: Skin is not pale.  Neurological:     Mental Status: He is alert and oriented to person, place, and time.      UC Treatments / Results  Labs (all labs ordered are listed, but only abnormal results are displayed) Labs Reviewed  CBC WITH DIFFERENTIAL/PLATELET    EKG   Radiology No results found.  Procedures Procedures (including critical care time)  Medications Ordered in UC Medications - No data to display  Initial Impression / Assessment and Plan / UC Course  I have reviewed the triage vital signs and the nursing notes.  Pertinent labs & imaging results that were available during my care of the patient were reviewed by me and considered in my medical decision making (see chart for details).    Patient was admitted to the hospital for COVID/possible upper GI bleed 02/26/2020. At the time, EGD was canceled due to COVID infection. Per chart review, he had endorsed dark stools then, but currently denies melena, hematochezia. Currently fatigued without other associated symptoms. Continues EtOH use. Will recheck cbc at this time. Patient to follow up with PCP as scheduled for reevaluation if fatigue does not improve. Return precautions given. Otherwise patient discharged in stable condition pending lab results. Patient expresses understanding and agrees to plan.  Final Clinical Impressions(s) / UC Diagnoses   Final diagnoses:  Fatigue, unspecified type    ED Prescriptions     None     PDMP not reviewed this encounter.   Belinda Fisher, PA-C 05/13/20 [phone]

## 2020-05-13 NOTE — Discharge Instructions (Signed)
No alarming signs at this time. We are rechecking your blood count to see if you are anemic. Keep hydrated, urine should be clear to pale yellow in color. Continue vitamin/iron as directed. Follow up with PCP as scheduled for further evaluation. If worsening symptoms, passing out, fever, chest pain/shortness of breath, confusion, go to the emergency department for further evaluation needed.

## 2020-05-13 NOTE — ED Triage Notes (Signed)
Pt c/o fatigue for the past 2 days. States hx of anemia and felt the same way then. States restarted his iron this morning. Denies any other sx's. States intake and output are normal.

## 2020-05-15 ENCOUNTER — Ambulatory Visit: Payer: BC Managed Care – PPO

## 2020-05-22 ENCOUNTER — Ambulatory Visit (INDEPENDENT_AMBULATORY_CARE_PROVIDER_SITE_OTHER): Payer: BC Managed Care – PPO

## 2020-05-22 ENCOUNTER — Other Ambulatory Visit: Payer: Self-pay

## 2020-05-22 DIAGNOSIS — U071 COVID-19: Secondary | ICD-10-CM

## 2020-05-22 DIAGNOSIS — J984 Other disorders of lung: Secondary | ICD-10-CM | POA: Diagnosis not present

## 2020-05-22 DIAGNOSIS — R918 Other nonspecific abnormal finding of lung field: Secondary | ICD-10-CM | POA: Diagnosis not present

## 2020-05-22 DIAGNOSIS — M47814 Spondylosis without myelopathy or radiculopathy, thoracic region: Secondary | ICD-10-CM | POA: Diagnosis not present

## 2020-05-22 NOTE — Progress Notes (Signed)
Patient here for repeat chest xray. 

## 2020-06-01 ENCOUNTER — Encounter: Payer: Self-pay | Admitting: Gastroenterology

## 2020-06-05 ENCOUNTER — Encounter: Payer: BC Managed Care – PPO | Admitting: Gastroenterology

## 2020-09-16 ENCOUNTER — Other Ambulatory Visit: Payer: Self-pay | Admitting: Internal Medicine

## 2020-09-16 DIAGNOSIS — I10 Essential (primary) hypertension: Secondary | ICD-10-CM

## 2020-09-17 NOTE — Telephone Encounter (Signed)
Patient hasn't been seen for BP since February, had an acute visit in May. Please schedule an in person appointment for BP follow up. Will send short supply once appointment has been made

## 2020-09-18 MED ORDER — LOSARTAN POTASSIUM 25 MG PO TABS: 25.0000 mg | ORAL_TABLET | Freq: Every day | ORAL | 0 refills | Status: DC

## 2020-09-18 NOTE — Addendum Note (Signed)
Addended by: Heidi Dach on: 09/18/2020 10:56 AM   Modules accepted: Orders

## 2020-10-02 ENCOUNTER — Ambulatory Visit (INDEPENDENT_AMBULATORY_CARE_PROVIDER_SITE_OTHER): Payer: BC Managed Care – PPO | Admitting: Family

## 2020-10-02 ENCOUNTER — Other Ambulatory Visit: Payer: Self-pay

## 2020-10-02 ENCOUNTER — Encounter: Payer: Self-pay | Admitting: Family

## 2020-10-02 VITALS — BP 146/93 | HR 79 | Wt 173.4 lb

## 2020-10-02 DIAGNOSIS — L309 Dermatitis, unspecified: Secondary | ICD-10-CM | POA: Diagnosis not present

## 2020-10-02 DIAGNOSIS — Z789 Other specified health status: Secondary | ICD-10-CM

## 2020-10-02 DIAGNOSIS — I10 Essential (primary) hypertension: Secondary | ICD-10-CM

## 2020-10-02 MED ORDER — LOSARTAN POTASSIUM 25 MG PO TABS: 25.0000 mg | ORAL_TABLET | Freq: Every day | ORAL | 0 refills | Status: DC

## 2020-10-02 MED ORDER — TRIAMCINOLONE ACETONIDE 0.025 % EX CREA: 1.0000 "application " | TOPICAL_CREAM | Freq: Two times a day (BID) | CUTANEOUS | 0 refills | Status: DC

## 2020-10-02 MED ORDER — ONE-DAILY MULTI VITAMINS PO TABS: 1.0000 | ORAL_TABLET | Freq: Every day | ORAL | 0 refills | Status: DC

## 2020-10-02 NOTE — Progress Notes (Signed)
Patient ID: Cameron Rogers, male    DOB: [birth year]  MRN: [id number]  CC: Hypertension Follow-Up  Subjective: Cameron Rogers is a 54 y.o. male with history of essential hypertension, acute upper GI bleed, neuropathy-alcoholic, elevated PSA, elevated liver function tests, prediabetes, alcohol abuse, and Covid-19 virus infection who presents for hypertension follow-up.   1. HYPERTENSION FOLLOW-UP: 11/28/2019:  Visit with Dr. Earlene Plater. Unable to assess if blood pressure at goal or not. Continued on current therapy. Will need to come in for visit for blood pressure check.  10/02/2020: Currently taking: see medication list Have you taken your blood pressure medication today: []  Yes [x]  No, also had 3 cigarettes and says had an alcoholic drink prior to visit Med Adherence: [x]  Yes    []  No Medication side effects: []  Yes    [x]  No Adherence with salt restriction: []  Yes    [x]  No Exercise: Yes []  No [x]   Home Monitoring?: []  Yes    [x]  No Smoking [x]  Yes, 0.5 pack daily  SOB? []  Yes    [x]  No Chest Pain?: []  Yes    [x]  No Leg swelling?: []  Yes    [x]  No Headaches?: []  Yes    [x]  No Dizziness? []  Yes    [x]  No  2. RASH: Duration: 1 month  Location: face  Itching: yes Burning: sensitive  Redness: yes Oozing: no Scaling: no Change in detergents/soaps/personal care products: Recently began using soap and generic brand detergent.  Context: worse Alleviating factors: none Treatments attempted:tried over-the-counter antibiotic cream Shortness of breath: no  Throat/tongue swelling: no Myalgias/arthralgias: no  Reports he thinks the reaction is coming from chemical products he uses while at work. 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   3. MULTIVITAMIN: Requests refills.   Patient Active Problem List   Diagnosis Date Noted  . Acute upper GI bleed 02/26/2020  . COVID-19 virus infection 02/26/2020   . Essential hypertension 11/28/2019  . Neuropathy, alcoholic (HCC) 11/28/2019  . Elevated PSA 11/28/2019  . Elevated liver function tests 11/28/2019  . Prediabetes 11/28/2019  . Alcohol abuse 11/28/2019     Current Outpatient Medications on File Prior to Visit  Medication Sig Dispense Refill  . ferrous sulfate 325 (65 FE) MG tablet Take 325 mg by mouth daily with breakfast.    . gabapentin (NEURONTIN) 300 MG capsule Take 1 capsule (300 mg total) by mouth at bedtime. 30 capsule 6  . [DISCONTINUED] hydrochlorothiazide (HYDRODIURIL) 25 MG tablet Take 1 tablet (25 mg total) by mouth daily. 90 tablet 1  . [DISCONTINUED] omeprazole (PRILOSEC) 20 MG capsule Take 1 capsule (20 mg total) by mouth daily. 10 capsule 0  . [DISCONTINUED] promethazine (PHENERGAN) 25 MG tablet Take 1 tablet (25 mg total) by mouth every 8 (eight) hours as needed for nausea or vomiting. 8 tablet 0   No current facility-administered medications on file prior to visit.    No Known Allergies  Social History   Socioeconomic History  . Marital status: Single    Spouse name: Not on file  . Number of children: Not on file  . Years of education: Not on file  . Highest education level: Not on file  Occupational History  . Occupation:  Tobacco Use  . Smoking status: Current Every Day Smoker    Packs/day: 0.50    Years: 38.00    Pack years: 19.00    Types: Cigarettes  .  Smokeless tobacco: Never Used  Vaping Use  . Vaping Use: Never used  Substance and Sexual Activity  . Alcohol use: Yes    Comment: 3- 40 ounce beers daily;  h/o heavy use, decreased use about a year ago  . Drug use: No  . Sexual activity: Not on file  Other Topics Concern  . Not on file  Social History Narrative  . Not on file   Social Determinants of Health   Financial Resource Strain: Not on file  Food Insecurity: Not on file  Transportation Needs: Not on file  Physical Activity: Not on file  Stress: Not on file   Social Connections: Not on file  Intimate Partner Violence: Not on file    Family History  Problem Relation Age of Onset  . Diabetes Mother   . Breast cancer Sister   . Diabetes Sister   . Heart murmur Sister   . Diabetes Brother   . Colon cancer Neg Hx   . Colon polyps Neg Hx   . Esophageal cancer Neg Hx   . Rectal cancer Neg Hx   . Stomach cancer Neg Hx   . Ovarian cancer Neg Hx     Past Surgical History:  Procedure Laterality Date  . NO PAST SURGERIES      ROS: Review of Systems Negative except as stated above  PHYSICAL EXAM: BP (!) 146/93 (BP Location: Left Arm, Patient Position: Sitting)   Pulse 79   Wt 173 lb 6.4 oz (78.7 kg)   SpO2 98%   BMI 23.85 kg/m   Physical Exam HENT:     Head: Normocephalic and atraumatic.  Eyes:     Extraocular Movements: Extraocular movements intact.     Pupils: Pupils are equal, round, and reactive to light.  Cardiovascular:     Rate and Rhythm: Normal rate and regular rhythm.     Pulses: Normal pulses.     Heart sounds: Normal heart sounds.  Pulmonary:     Effort: Pulmonary effort is normal.     Breath sounds: Normal breath sounds.  Musculoskeletal:     Cervical back: Normal range of motion and neck supple.  Skin:    Findings: Erythema and rash present.     Comments: Erythematous papular rash at bilateral cheeks and forehead. No drainage or edema presents at those sites.   Neurological:     General: No focal deficit present.     Mental Status: He is alert and oriented to person, place, and time.  Psychiatric:        Mood and Affect: Mood normal.        Behavior: Behavior normal.    ASSESSMENT AND PLAN: 1. Essential hypertension: - Blood pressure not at goal during today's visit. Patient asymptomatic without chest pressure, chest pain, palpitations, and shortness of breath. - Patient did not take blood pressure prior to visit. Also, has 3 cigarettes and an alcohol beverage prior to visit.  - Level of blood pressure  control unknown as patient does not monitor at home.  - Continue Losartan as prescribed. - Counseled on blood pressure goal of less than 130/80, low-sodium, DASH diet, medication compliance, 150 minutes of moderate intensity exercise per week as tolerated. Discussed medication compliance, adverse effects. - Follow-up with in 4 weeks or sooner if needed with provider for blood pressure check. Counseled to take blood pressure medication prior to visit so that we may get an accurate reading. Patient verbalized understanding. - BMP to check kidney function and electrolyte balance.  Will return within 1 week to have this done. - Basic Metabolic Panel; Future - losartan (COZAAR) 25 MG tablet; Take 1 tablet (25 mg total) by mouth daily.  Dispense: 90 tablet; Refill: 0  2. Facial dermatitis: - Patient with what appears to be facial dermatitis. Ongoing for at least 1 month. 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 Also, recently changed body soap and clothing detergent. - Will try course of Triamcinolone cream to see if this help.  - Offered referral to Dermatology. Patient says he is not ready for that just yet. - Follow-up with provider in 2 weeks or sooner if needed. - triamcinolone (KENALOG) 0.025 % cream; Apply 1 application topically 2 (two) times daily.  Dispense: 100 g; Refill: 0  3. Takes daily multivitamins: - Continue multiple vitamin as prescribed.  - Multiple Vitamin (MULTIVITAMIN) tablet; Take 1 tablet by mouth daily.  Dispense: 90 tablet; Refill: 0  Patient was given the opportunity to ask questions.  Patient verbalized understanding of the plan and was able to repeat key elements of the plan. Patient was given clear instructions to go to Emergency Department or return to medical center if symptoms don't improve, worsen, or new problems develop.The patient verbalized understanding.   Orders Placed This Encounter  Procedures  . Basic  Metabolic Panel     Requested Prescriptions   Signed Prescriptions Disp Refills  . triamcinolone (KENALOG) 0.025 % cream 100 g 0    Sig: Apply 1 application topically 2 (two) times daily.  [name] losartan (COZAAR) 25 MG tablet 90 tablet 0    Sig: Take 1 tablet (25 mg total) by mouth daily.  . Multiple Vitamin (MULTIVITAMIN) tablet 90 tablet 0    Sig: Take 1 tablet by mouth daily.    Return in about 4 weeks (around 10/30/2020) for Ricky Stabs, NP and 1 week for lab visit.  Rema Fendt, NP

## 2020-10-02 NOTE — Progress Notes (Signed)
Hypertension Face break out month ago  Works at [employer], thinks Engineer, agricultural called [place] could be the cause  Need multivitamin refill

## 2020-10-02 NOTE — Patient Instructions (Addendum)
Continue Losartan for high blood pressure.  Follow-up in 1 month for blood pressure check.  Lab visit within 1 week.  Triamcinolone for rash.   Continue Multivitamin. Hypertension, Adult Hypertension is another name for high blood pressure. High blood pressure forces your heart to work harder to pump blood. This can cause problems over time. There are two numbers in a blood pressure reading. There is a top number (systolic) over a bottom number (diastolic). It is best to have a blood pressure that is below 120/80. Healthy choices can help lower your blood pressure, or you may need medicine to help lower it. What are the causes? The cause of this condition is not known. Some conditions may be related to high blood pressure. What increases the risk?  Smoking.  Having type 2 diabetes mellitus, high cholesterol, or both.  Not getting enough exercise or physical activity.  Being overweight.  Having too much fat, sugar, calories, or salt (sodium) in your diet.  Drinking too much alcohol.  Having long-term (chronic) kidney disease.  Having a family history of high blood pressure.  Age. Risk increases with age.  Race. You may be at higher risk if you are African American.  Gender. Men are at higher risk than women before age 6. After age 50, women are at higher risk than men.  Having obstructive sleep apnea.  Stress. What are the signs or symptoms?  High blood pressure may not cause symptoms. Very high blood pressure (hypertensive crisis) may cause: ? Headache. ? Feelings of worry or nervousness (anxiety). ? Shortness of breath. ? Nosebleed. ? A feeling of being sick to your stomach (nausea). ? Throwing up (vomiting). ? Changes in how you see. ? Very bad chest pain. ? Seizures. How is this treated?  This condition is treated by making healthy lifestyle changes, such as: ? Eating healthy foods. ? Exercising more. ? Drinking less alcohol.  Your health care provider  may prescribe medicine if lifestyle changes are not enough to get your blood pressure under control, and if: ? Your top number is above 130. ? Your bottom number is above 80.  Your personal target blood pressure may vary. Follow these instructions at home: Eating and drinking   If told, follow the DASH eating plan. To follow this plan: ? Fill one half of your plate at each meal with fruits and vegetables. ? Fill one fourth of your plate at each meal with whole grains. Whole grains include whole-wheat pasta, brown rice, and whole-grain bread. ? Eat or drink low-fat dairy products, such as skim milk or low-fat yogurt. ? Fill one fourth of your plate at each meal with low-fat (lean) proteins. Low-fat proteins include fish, chicken without skin, eggs, beans, and tofu. ? Avoid fatty meat, cured and processed meat, or chicken with skin. ? Avoid pre-made or processed food.  Eat less than 1,500 mg of salt each day.  Do not drink alcohol if: ? Your doctor tells you not to drink. ? You are pregnant, may be pregnant, or are planning to become pregnant.  If you drink alcohol: ? Limit how much you use to:  0-1 drink a day for women.  0-2 drinks a day for men. ? Be aware of how much alcohol is in your drink. In the [place] one drink equals one 12 oz bottle of beer (355 mL), one 5 oz glass of wine (148 mL), or one 1 oz glass of hard liquor (44 mL). Lifestyle   Work with your  doctor to stay at a healthy weight or to lose weight. Ask your doctor what the best weight is for you.  Get at least 30 minutes of exercise most days of the week. This may include walking, swimming, or biking.  Get at least 30 minutes of exercise that strengthens your muscles (resistance exercise) at least 3 days a week. This may include lifting weights or doing Pilates.  Do not use any products that contain nicotine or tobacco, such as cigarettes, e-cigarettes, and chewing tobacco. If you need help quitting, ask your  doctor.  Check your blood pressure at home as told by your doctor.  Keep all follow-up visits as told by your doctor. This is important. Medicines  Take over-the-counter and prescription medicines only as told by your doctor. Follow directions carefully.  Do not skip doses of blood pressure medicine. The medicine does not work as well if you skip doses. Skipping doses also puts you at risk for problems.  Ask your doctor about side effects or reactions to medicines that you should watch for. Contact a doctor if you:  Think you are having a reaction to the medicine you are taking.  Have headaches that keep coming back (recurring).  Feel dizzy.  Have swelling in your ankles.  Have trouble with your vision. Get help right away if you:  Get a very bad headache.  Start to feel mixed up (confused).  Feel weak or numb.  Feel faint.  Have very bad pain in your: ? Chest. ? Belly (abdomen).  Throw up more than once.  Have trouble breathing. Summary  Hypertension is another name for high blood pressure.  High blood pressure forces your heart to work harder to pump blood.  For most people, a normal blood pressure is less than 120/80.  Making healthy choices can help lower blood pressure. If your blood pressure does not get lower with healthy choices, you may need to take medicine. This information is not intended to replace advice given to you by your health care provider. Make sure you discuss any questions you have with your health care provider. Document Revised: 06/13/2018 Document Reviewed: 06/13/2018 Elsevier Patient Education  2020 [employer].

## 2020-10-12 ENCOUNTER — Ambulatory Visit
Admission: EM | Admit: 2020-10-12 | Discharge: 2020-10-12 | Disposition: A | Discharge status: Home / Self Care | Payer: BC Managed Care – PPO | Attending: Emergency Medicine | Admitting: Emergency Medicine

## 2020-10-12 ENCOUNTER — Other Ambulatory Visit: Payer: Self-pay

## 2020-10-12 DIAGNOSIS — K649 Unspecified hemorrhoids: Secondary | ICD-10-CM | POA: Diagnosis not present

## 2020-10-12 LAB — POCT URINALYSIS DIP (MANUAL ENTRY)
Bilirubin, UA: NEGATIVE
Blood, UA: NEGATIVE
Glucose, UA: NEGATIVE mg/dL
Ketones, POC UA: NEGATIVE mg/dL
Leukocytes, UA: NEGATIVE
Nitrite, UA: NEGATIVE
Protein Ur, POC: 100 mg/dL — AB
Spec Grav, UA: >=1.03 — AB (ref 1.010–1.025)
Urobilinogen, UA: 0.2 E.U./dL
pH, UA: 5.5 (ref 5.0–8.0)

## 2020-10-12 MED ORDER — HYDROCORTISONE (PERIANAL) 2.5 % EX CREA: 1.0000 "application " | TOPICAL_CREAM | Freq: Two times a day (BID) | CUTANEOUS | 0 refills | Status: DC

## 2020-10-12 NOTE — Discharge Instructions (Addendum)
Sitz bath as needed for additional pain relief: For few inches of warm, plain water and sit. Avoid Epson salt as this can burn. Use rectal suppositories as directed.  May use HYDROCORTISONE instead if too expensive. Very important follow-up with surgery for further evaluation and management. Go to ER for severe pain, bleeding, fever.

## 2020-10-12 NOTE — ED Triage Notes (Signed)
Patient states that he has had blood in his urine and stool since yesterday. Pt states it was on the paper when he wiped and is unsure about the urine. Pt is aox4 and ambulatory.

## 2020-10-12 NOTE — ED Provider Notes (Signed)
[name]    CSN: [id number] Arrival date & time: 10/12/20  7893      History   Chief Complaint Chief Complaint  Patient presents with  . Hematuria    Since yesterday    HPI Cameron Rogers is a 54 y.o. male  With history as below presenting for blood in stool and possibly urine.  Noticed this yesterday.  Denies history thereof.  States bright red blood was on toilet paper when he wiped.  Denied melena, rectal or anal pain, irritation.  No penile or testicular pain, discharge.  Denies fever, abdominal pain, nausea or vomiting.  Last BM this morning with BRB on toilet paper.  Does have some anal irritation: No prolapse tissue.  Denies history of hemorrhoids, personal or family history of colorectal cancer.  Past Medical History:  Diagnosis Date  . Allergy    SEASONAL  . Arthritis   . Heart murmur   . Hypertension     Patient Active Problem List   Diagnosis Date Noted  . Acute upper GI bleed 02/26/2020  . COVID-19 virus infection 02/26/2020  . Essential hypertension 11/28/2019  . Neuropathy, alcoholic (HCC) 11/28/2019  . Elevated PSA 11/28/2019  . Elevated liver function tests 11/28/2019  . Prediabetes 11/28/2019  . Alcohol abuse 11/28/2019    Past Surgical History:  Procedure Laterality Date  . NO PAST SURGERIES         Home Medications    Prior to Admission medications   Medication Sig Start Date End Date Taking? Authorizing Provider  ferrous sulfate 325 (65 FE) MG tablet Take 325 mg by mouth daily with breakfast.   Yes [provider]  hydrocortisone (ANUSOL-HC) 2.5 % rectal cream Place 1 application rectally 2 (two) times daily. 10/12/20  Yes Hall-Potvin, [place], PA-C  losartan (COZAAR) 25 MG tablet Take 1 tablet (25 mg total) by mouth daily. 10/02/20  Yes Zonia Kief, Amy J, NP  Multiple Vitamin (MULTIVITAMIN) tablet Take 1 tablet by mouth daily. 10/02/20  Yes Zonia Kief, Amy J, NP  triamcinolone (KENALOG) 0.025 % cream Apply 1  application topically 2 (two) times daily. 10/02/20  Yes Zonia Kief, Amy J, NP  gabapentin (NEURONTIN) 300 MG capsule Take 1 capsule (300 mg total) by mouth at bedtime. 06/06/19   [name], MD  hydrochlorothiazide (HYDRODIURIL) 25 MG tablet Take 1 tablet (25 mg total) by mouth daily. 06/02/16 04/02/19  [name], PA-C  omeprazole (PRILOSEC) 20 MG capsule Take 1 capsule (20 mg total) by mouth daily. 04/25/17 04/02/19  [name], MD  promethazine (PHENERGAN) 25 MG tablet Take 1 tablet (25 mg total) by mouth every 8 (eight) hours as needed for nausea or vomiting. 04/25/17 04/02/19  [name], MD    Family History Family History  Problem Relation Age of Onset  . Diabetes Mother   . Breast cancer Sister   . Diabetes Sister   . Heart murmur Sister   . Diabetes Brother   . Colon cancer Neg Hx   . Colon polyps Neg Hx   . Esophageal cancer Neg Hx   . Rectal cancer Neg Hx   . Stomach cancer Neg Hx   . Ovarian cancer Neg Hx     Social History Social History   Tobacco Use  . Smoking status: Current Every Day Smoker    Packs/day: 0.50    Years: 38.00    Pack years: 19.00    Types: Cigarettes  . Smokeless tobacco: Never Used  Vaping Use  . Vaping Use: Never  used  Substance Use Topics  . Alcohol use: Yes    Comment: 3- 40 ounce beers daily;  h/o heavy use, decreased use about a year ago  . Drug use: No     Allergies   Patient has no known allergies.   Review of Systems Review of Systems  Constitutional: Negative for fatigue and fever.  Respiratory: Negative for cough and shortness of breath.   Cardiovascular: Negative for chest pain and palpitations.  Gastrointestinal: Positive for anal bleeding. Negative for abdominal pain, diarrhea, nausea and vomiting.  Genitourinary: Positive for hematuria. Negative for difficulty urinating, dysuria, penile discharge, penile pain and testicular pain.  Musculoskeletal: Negative for arthralgias and myalgias.  Skin:  Negative for rash and wound.  Neurological: Negative for speech difficulty and headaches.  All other systems reviewed and are negative.    Physical Exam Triage Vital Signs ED Triage Vitals  Enc Vitals Group     BP      Pulse      Resp      Temp      Temp src      SpO2      Weight      Height      Head Circumference      Peak Flow      Pain Score      Pain Loc      Pain Edu?      Excl. in GC?    No data found.  Updated Vital Signs BP (!) 169/112 (BP Location: Right Arm)   Pulse 89   Temp 98.2 F (36.8 C) (Oral)   Resp 20   SpO2 98%   Visual Acuity Right Eye Distance:   Left Eye Distance:   Bilateral Distance:    Right Eye Near:   Left Eye Near:    Bilateral Near:     Physical Exam Constitutional:      General: He is not in acute distress. HENT:     Head: Normocephalic and atraumatic.  Eyes:     General: No scleral icterus.    Pupils: Pupils are equal, round, and reactive to light.  Cardiovascular:     Rate and Rhythm: Normal rate.  Pulmonary:     Effort: Pulmonary effort is normal. No respiratory distress.     Breath sounds: No wheezing.  Abdominal:     Palpations: Abdomen is soft.     Tenderness: There is no abdominal tenderness.  Genitourinary:    Comments: Anal exam unremarkable.  Internal exam declined Skin:    Coloration: Skin is not jaundiced or pale.  Neurological:     Mental Status: He is alert and oriented to person, place, and time.      UC Treatments / Results  Labs (all labs ordered are listed, but only abnormal results are displayed) Labs Reviewed  POCT URINALYSIS DIP (MANUAL ENTRY) - Abnormal; Notable for the following components:      Result Value   Spec Grav, UA >=1.030 (*)    Protein Ur, POC =100 (*)    All other components within normal limits    EKG   Radiology No results found.  Procedures Procedures (including critical care time)  Medications Ordered in UC Medications - No data to display  Initial  Impression / Assessment and Plan / UC Course  I have reviewed the triage vital signs and the nursing notes.  Pertinent labs & imaging results that were available during my care of the patient were reviewed by me and  considered in my medical decision making (see chart for details).     Urine as above.  H&P concerning for hemorrhoids.  Low concern for acute process: We will treat supportively as below, follow-up with PCP.  Patient overdue for colonoscopy: Provided GI contact information to schedule this.  Return precautions discussed, pt verbalized understanding and is agreeable to plan. Final Clinical Impressions(s) / UC Diagnoses   Final diagnoses:  Hemorrhoids, unspecified hemorrhoid type     Discharge Instructions     Sitz bath as needed for additional pain relief: For few inches of warm, plain water and sit. Avoid Epson salt as this can burn. Use rectal suppositories as directed.  May use HYDROCORTISONE instead if too expensive. Very important follow-up with surgery for further evaluation and management. Go to ER for severe pain, bleeding, fever.    ED Prescriptions    Medication Sig Dispense Auth. Provider   hydrocortisone (ANUSOL-HC) 2.5 % rectal cream Place 1 application rectally 2 (two) times daily. 30 g Hall-Potvin, [place], PA-C     PDMP not reviewed this encounter.   Odette Fraction [place], [place] 10/12/20 1115

## 2020-10-22 ENCOUNTER — Other Ambulatory Visit: Payer: Self-pay

## 2020-10-23 ENCOUNTER — Encounter: Payer: BC Managed Care – PPO | Admitting: Family

## 2020-10-23 ENCOUNTER — Other Ambulatory Visit: Payer: BC Managed Care – PPO

## 2020-10-23 NOTE — Progress Notes (Signed)
Patient did not show for appointment.   

## 2020-10-30 ENCOUNTER — Ambulatory Visit (INDEPENDENT_AMBULATORY_CARE_PROVIDER_SITE_OTHER): Payer: BC Managed Care – PPO | Admitting: Family

## 2020-10-30 ENCOUNTER — Encounter: Payer: Self-pay | Admitting: Family

## 2020-10-30 ENCOUNTER — Other Ambulatory Visit: Payer: Self-pay

## 2020-10-30 VITALS — BP 147/84 | HR 87 | Wt 173.4 lb

## 2020-10-30 DIAGNOSIS — I10 Essential (primary) hypertension: Secondary | ICD-10-CM | POA: Diagnosis not present

## 2020-10-30 MED ORDER — LOSARTAN POTASSIUM 50 MG PO TABS: 50.0000 mg | ORAL_TABLET | Freq: Every day | ORAL | 0 refills | Status: DC

## 2020-10-30 NOTE — Progress Notes (Signed)
HTN f/u  

## 2020-10-30 NOTE — Patient Instructions (Addendum)
Increase Losartan from 25 mg/daily to 50 mg/daily.  Lab today.  Follow-up in 2 - 4 weeks for blood pressure checkup.   Hypertension, Adult Hypertension is another name for high blood pressure. High blood pressure forces your heart to work harder to pump blood. This can cause problems over time. There are two numbers in a blood pressure reading. There is a top number (systolic) over a bottom number (diastolic). It is best to have a blood pressure that is below 120/80. Healthy choices can help lower your blood pressure, or you may need medicine to help lower it. What are the causes? The cause of this condition is not known. Some conditions may be related to high blood pressure. What increases the risk?  Smoking.  Having type 2 diabetes mellitus, high cholesterol, or both.  Not getting enough exercise or physical activity.  Being overweight.  Having too much fat, sugar, calories, or salt (sodium) in your diet.  Drinking too much alcohol.  Having long-term (chronic) kidney disease.  Having a family history of high blood pressure.  Age. Risk increases with age.  Race. You may be at higher risk if you are African American.  Gender. Men are at higher risk than women before age 53. After age 21, women are at higher risk than men.  Having obstructive sleep apnea.  Stress. What are the signs or symptoms?  High blood pressure may not cause symptoms. Very high blood pressure (hypertensive crisis) may cause: ? Headache. ? Feelings of worry or nervousness (anxiety). ? Shortness of breath. ? Nosebleed. ? A feeling of being sick to your stomach (nausea). ? Throwing up (vomiting). ? Changes in how you see. ? Very bad chest pain. ? Seizures. How is this treated?  This condition is treated by making healthy lifestyle changes, such as: ? Eating healthy foods. ? Exercising more. ? Drinking less alcohol.  Your health care provider may prescribe medicine if lifestyle changes are  not enough to get your blood pressure under control, and if: ? Your top number is above 130. ? Your bottom number is above 80.  Your personal target blood pressure may vary. Follow these instructions at home: Eating and drinking  If told, follow the DASH eating plan. To follow this plan: ? Fill one half of your plate at each meal with fruits and vegetables. ? Fill one fourth of your plate at each meal with whole grains. Whole grains include whole-wheat pasta, brown rice, and whole-grain bread. ? Eat or drink low-fat dairy products, such as skim milk or low-fat yogurt. ? Fill one fourth of your plate at each meal with low-fat (lean) proteins. Low-fat proteins include fish, chicken without skin, eggs, beans, and tofu. ? Avoid fatty meat, cured and processed meat, or chicken with skin. ? Avoid pre-made or processed food.  Eat less than 1,500 mg of salt each day.  Do not drink alcohol if: ? Your doctor tells you not to drink. ? You are pregnant, may be pregnant, or are planning to become pregnant.  If you drink alcohol: ? Limit how much you use to:  0-1 drink a day for women.  0-2 drinks a day for men. ? Be aware of how much alcohol is in your drink. In the [place] one drink equals one 12 oz bottle of beer (355 mL), one 5 oz glass of wine (148 mL), or one 1 oz glass of hard liquor (44 mL).   Lifestyle  Work with your doctor to stay at a  healthy weight or to lose weight. Ask your doctor what the best weight is for you.  Get at least 30 minutes of exercise most days of the week. This may include walking, swimming, or biking.  Get at least 30 minutes of exercise that strengthens your muscles (resistance exercise) at least 3 days a week. This may include lifting weights or doing Pilates.  Do not use any products that contain nicotine or tobacco, such as cigarettes, e-cigarettes, and chewing tobacco. If you need help quitting, ask your doctor.  Check your blood pressure at home as told  by your doctor.  Keep all follow-up visits as told by your doctor. This is important.   Medicines  Take over-the-counter and prescription medicines only as told by your doctor. Follow directions carefully.  Do not skip doses of blood pressure medicine. The medicine does not work as well if you skip doses. Skipping doses also puts you at risk for problems.  Ask your doctor about side effects or reactions to medicines that you should watch for. Contact a doctor if you:  Think you are having a reaction to the medicine you are taking.  Have headaches that keep coming back (recurring).  Feel dizzy.  Have swelling in your ankles.  Have trouble with your vision. Get help right away if you:  Get a very bad headache.  Start to feel mixed up (confused).  Feel weak or numb.  Feel faint.  Have very bad pain in your: ? Chest. ? Belly (abdomen).  Throw up more than once.  Have trouble breathing. Summary  Hypertension is another name for high blood pressure.  High blood pressure forces your heart to work harder to pump blood.  For most people, a normal blood pressure is less than 120/80.  Making healthy choices can help lower blood pressure. If your blood pressure does not get lower with healthy choices, you may need to take medicine. This information is not intended to replace advice given to you by your health care provider. Make sure you discuss any questions you have with your health care provider. Document Revised: 06/13/2018 Document Reviewed: 06/13/2018 Elsevier Patient Education  2021 [employer].

## 2020-10-30 NOTE — Progress Notes (Signed)
Patient ID: Cameron Rogers, male    DOB: Jan 13, 1966  MRN: [id number]  CC: Hypertension Follow-Up   Subjective: Cameron Rogers is a 55 y.o. male who presents for hypertension follow-up.  1. HYPERTENSION FOLLOW-UP: 10/02/2020: Blood pressure not at goal. Patient did not take medication prior to appointment. Continued on Losartan. Blood pressure check in 4 weeks.  10/30/2020: Reports he did take blood pressure medication prior to today's visit. Endorses taking blood pressure medication everyday without missing doses, no side effects. Denies shortness of breath, chest pain, leg swelling, headaches, and dizziness. Does not check blood pressure at home. Does not practice low-salt diet and does not exercise. States he believes his blood pressure is high today because he smoked prior to this appointment and has not eaten or had anything to drink today.   Patient Active Problem List   Diagnosis Date Noted  . Acute upper GI bleed 02/26/2020  . COVID-19 virus infection 02/26/2020  . Essential hypertension 11/28/2019  . Neuropathy, alcoholic (HCC) 11/28/2019  . Elevated PSA 11/28/2019  . Elevated liver function tests 11/28/2019  . Prediabetes 11/28/2019  . Alcohol abuse 11/28/2019     Current Outpatient Medications on File Prior to Visit  Medication Sig Dispense Refill  . ferrous sulfate 325 (65 FE) MG tablet Take 325 mg by mouth daily with breakfast.    . gabapentin (NEURONTIN) 300 MG capsule Take 1 capsule (300 mg total) by mouth at bedtime. 30 capsule 6  . hydrocortisone (ANUSOL-HC) 2.5 % rectal cream Place 1 application rectally 2 (two) times daily. 30 g 0  . Multiple Vitamin (MULTIVITAMIN) tablet Take 1 tablet by mouth daily. 90 tablet 0  . triamcinolone (KENALOG) 0.025 % cream Apply 1 application topically 2 (two) times daily. 100 g 0  . [DISCONTINUED] hydrochlorothiazide (HYDRODIURIL) 25 MG tablet Take 1 tablet (25 mg total) by mouth daily. 90 tablet 1  . [DISCONTINUED] omeprazole  (PRILOSEC) 20 MG capsule Take 1 capsule (20 mg total) by mouth daily. 10 capsule 0  . [DISCONTINUED] promethazine (PHENERGAN) 25 MG tablet Take 1 tablet (25 mg total) by mouth every 8 (eight) hours as needed for nausea or vomiting. 8 tablet 0   No current facility-administered medications on file prior to visit.    No Known Allergies  Social History   Socioeconomic History  . Marital status: Single    Spouse name: Not on file  . Number of children: Not on file  . Years of education: Not on file  . Highest education level: Not on file  Occupational History  . Occupation: Oceanographer  Tobacco Use  . Smoking status: Current Every Day Smoker    Packs/day: 0.50    Years: 38.00    Pack years: 19.00    Types: Cigarettes  . Smokeless tobacco: Never Used  Vaping Use  . Vaping Use: Never used  Substance and Sexual Activity  . Alcohol use: Yes    Comment: 3- 40 ounce beers daily;  h/o heavy use, decreased use about a year ago  . Drug use: No  . Sexual activity: Yes  Other Topics Concern  . Not on file  Social History Narrative  . Not on file   Social Determinants of Health   Financial Resource Strain: Not on file  Food Insecurity: Not on file  Transportation Needs: Not on file  Physical Activity: Not on file  Stress: Not on file  Social Connections: Not on file  Intimate Partner Violence: Not on file    Family  History  Problem Relation Age of Onset  . Diabetes Mother   . Breast cancer Sister   . Diabetes Sister   . Heart murmur Sister   . Diabetes Brother   . Colon cancer Neg Hx   . Colon polyps Neg Hx   . Esophageal cancer Neg Hx   . Rectal cancer Neg Hx   . Stomach cancer Neg Hx   . Ovarian cancer Neg Hx     Past Surgical History:  Procedure Laterality Date  . NO PAST SURGERIES      ROS: Review of Systems Negative except as stated above  PHYSICAL EXAM: BP (!) 147/84 (BP Location: Left Arm, Patient Position: Sitting)   Pulse 87   Wt 173 lb 6.4  oz (78.7 kg)   SpO2 97%   BMI 23.85 kg/m   Vitals with BMI 10/30/2020 10/30/2020 10/12/2020  Height - - -  Weight - 173 lbs 6 oz -  BMI - - -  Systolic 147 151 096  Diastolic 84 93 112  Pulse - 87 89    Physical Exam HENT:     Head: Normocephalic.  Eyes:     Extraocular Movements: Extraocular movements intact.     Pupils: Pupils are equal, round, and reactive to light.  Cardiovascular:     Rate and Rhythm: Normal rate and regular rhythm.     Pulses: Normal pulses.     Heart sounds: Normal heart sounds.  Pulmonary:     Effort: Pulmonary effort is normal.     Breath sounds: Normal breath sounds.  Musculoskeletal:     Cervical back: Normal range of motion and neck supple.  Neurological:     General: No focal deficit present.     Mental Status: He is alert and oriented to person, place, and time.  Psychiatric:        Mood and Affect: Mood normal.        Behavior: Behavior normal.     ASSESSMENT AND PLAN: 1. Essential hypertension: - Blood pressure not at goal during today's visit. Patient asymptomatic without chest pressure, chest pain, palpitations, and shortness of breath. - Level of blood pressure control unknown as patient does not monitor at home.  - Increase Losartan from 25 mg daily to 50 mg daily.  - Follow-up in 2 to 4 weeks for blood pressure check.  - Counseled on blood pressure goal of less than 130/80, low-sodium, DASH diet, medication compliance, 150 minutes of moderate intensity exercise per week as tolerated. Discussed medication compliance, adverse effects. - BMP to check kidney function and electrolytes balance. - Basic Metabolic Panel - losartan (COZAAR) 50 MG tablet; Take 1 tablet (50 mg total) by mouth daily.  Dispense: 30 tablet; Refill: 0   Patient was given the opportunity to ask questions.  Patient verbalized understanding of the plan and was able to repeat key elements of the plan. Patient was given clear instructions to go to Emergency Department  or return to medical center if symptoms don't improve, worsen, or new problems develop.The patient verbalized understanding.    Orders Placed This Encounter  Procedures  . Basic Metabolic Panel     Requested Prescriptions   Signed Prescriptions Disp Refills  . losartan (COZAAR) 50 MG tablet 30 tablet 0    Sig: Take 1 tablet (50 mg total) by mouth daily.    Return in about 3 weeks (around 11/20/2020) for Dr. Earlene Plater.  Rema Fendt, NP

## 2020-10-31 LAB — BASIC METABOLIC PANEL
BUN/Creatinine Ratio: 13 (ref 9–20)
BUN: 10 mg/dL (ref 6–24)
CO2: 19 mmol/L — ABNORMAL LOW (ref 20–29)
Calcium: 9.6 mg/dL (ref 8.7–10.2)
Chloride: 106 mmol/L (ref 96–106)
Creatinine, Ser: 0.75 mg/dL — ABNORMAL LOW (ref 0.76–1.27)
GFR calc Af Amer: 120 mL/min/{1.73_m2} (ref >59)
GFR calc non Af Amer: 104 mL/min/{1.73_m2} (ref >59)
Glucose: 79 mg/dL (ref 65–99)
Potassium: 4.4 mmol/L (ref 3.5–5.2)
Sodium: 140 mmol/L (ref 134–144)

## 2020-10-31 NOTE — Progress Notes (Signed)
Please call patient with update.   Kidney function normal.

## 2020-11-19 ENCOUNTER — Other Ambulatory Visit: Payer: Self-pay

## 2020-11-20 ENCOUNTER — Other Ambulatory Visit: Payer: Self-pay

## 2020-11-20 ENCOUNTER — Ambulatory Visit: Payer: BC Managed Care – PPO | Admitting: Internal Medicine

## 2020-11-20 DIAGNOSIS — L309 Dermatitis, unspecified: Secondary | ICD-10-CM

## 2020-11-20 MED ORDER — TRIAMCINOLONE ACETONIDE 0.025 % EX CREA: 1.0000 "application " | TOPICAL_CREAM | Freq: Two times a day (BID) | CUTANEOUS | 1 refills | Status: DC

## 2020-11-20 MED ORDER — TRIAMCINOLONE ACETONIDE 0.025 % EX CREA: 1.0000 "application " | TOPICAL_CREAM | Freq: Two times a day (BID) | CUTANEOUS | 0 refills | Status: DC

## 2020-11-20 NOTE — Progress Notes (Signed)
Triamcinolone cream refilled.

## 2020-12-03 ENCOUNTER — Ambulatory Visit: Payer: BC Managed Care – PPO | Admitting: Internal Medicine

## 2020-12-18 ENCOUNTER — Telehealth: Payer: Self-pay

## 2020-12-18 ENCOUNTER — Telehealth (INDEPENDENT_AMBULATORY_CARE_PROVIDER_SITE_OTHER): Payer: BC Managed Care – PPO | Admitting: Family Medicine

## 2020-12-18 ENCOUNTER — Other Ambulatory Visit: Payer: Self-pay

## 2020-12-18 ENCOUNTER — Encounter: Payer: Self-pay | Admitting: Family Medicine

## 2020-12-18 VITALS — BP 147/90 | HR 91 | Ht 74.0 in | Wt 177.0 lb

## 2020-12-18 DIAGNOSIS — I10 Essential (primary) hypertension: Secondary | ICD-10-CM | POA: Diagnosis not present

## 2020-12-18 DIAGNOSIS — L309 Dermatitis, unspecified: Secondary | ICD-10-CM

## 2020-12-18 MED ORDER — TRIAMCINOLONE ACETONIDE 0.5 % EX OINT: 1.0000 "application " | TOPICAL_OINTMENT | Freq: Two times a day (BID) | CUTANEOUS | 1 refills | Status: DC

## 2020-12-18 MED ORDER — LOSARTAN POTASSIUM-HCTZ 50-12.5 MG PO TABS: 1.0000 | ORAL_TABLET | Freq: Every day | ORAL | 1 refills | Status: DC

## 2020-12-18 NOTE — Progress Notes (Signed)
Virtual Visit via Telephone Note  I connected with Cameron Rogers, on 12/18/2020 at 11:13 AM by telephone due to the COVID-19 pandemic and verified that I am speaking with the correct person using two identifiers.   Consent: I discussed the limitations, risks, security and privacy concerns of performing an evaluation and management service by telephone and the availability of in person appointments. I also discussed with the patient that there may be a patient responsible charge related to this service. The patient expressed understanding and agreed to proceed.   Location of Patient: At work  Location of Provider: Clinic   Persons participating in Telemedicine visit: Jimmey Hengel RN Dr. Alvis Lemmings     History of Present Illness: Cameron Rogers is a 55 year old male with a history of Hypertension seen for a follow up visit. At his last visit his Hypertension was suboptimally controlled and dose of Losartan increased.   He has a rash on his face for which he had received Triamcinolone 0.025% at his visit with the NP. His face is getting worse and is not responding to Triamcinolone. Rash started 3 months ago. Rash is not itchy. He is unsure if this is related to his soaps or lotion. He endorses changing his detergent.  Past Medical History:  Diagnosis Date  . Allergy    SEASONAL  . Arthritis   . Heart murmur   . Hypertension    No Known Allergies  Current Outpatient Medications on File Prior to Visit  Medication Sig Dispense Refill  . ferrous sulfate 325 (65 FE) MG tablet Take 325 mg by mouth daily with breakfast.    . gabapentin (NEURONTIN) 300 MG capsule Take 1 capsule (300 mg total) by mouth at bedtime. 30 capsule 6  . hydrocortisone (ANUSOL-HC) 2.5 % rectal cream Place 1 application rectally 2 (two) times daily. 30 g 0  . losartan (COZAAR) 50 MG tablet Take 1 tablet (50 mg total) by mouth daily. 30 tablet 0  . Multiple Vitamin (MULTIVITAMIN) tablet Take 1  tablet by mouth daily. 90 tablet 0  . triamcinolone (KENALOG) 0.025 % cream Apply 1 application topically 2 (two) times daily. 100 g 1  . [DISCONTINUED] hydrochlorothiazide (HYDRODIURIL) 25 MG tablet Take 1 tablet (25 mg total) by mouth daily. 90 tablet 1  . [DISCONTINUED] omeprazole (PRILOSEC) 20 MG capsule Take 1 capsule (20 mg total) by mouth daily. 10 capsule 0  . [DISCONTINUED] promethazine (PHENERGAN) 25 MG tablet Take 1 tablet (25 mg total) by mouth every 8 (eight) hours as needed for nausea or vomiting. 8 tablet 0   No current facility-administered medications on file prior to visit.    ROS: See HPI  Observations/Objective: Patient came into the office later for vital signs-BP 147/90 Awake, alert, oriented x3 Not in acute distress Normal mood   CMP Latest Ref Rng & Units 10/30/2020 05/08/2020 03/27/2020  Glucose 65 - 99 mg/dL 79 - 91  BUN 6 - 24 mg/dL 10 - 11  Creatinine 3.29 - 1.27 mg/dL 5.18(A) - 4.16(S)  Sodium 134 - 144 mmol/L 140 - 140  Potassium 3.5 - 5.2 mmol/L 4.4 - 4.0  Chloride 96 - 106 mmol/L 106 - 104  CO2 20 - 29 mmol/L 19(L) - 18(L)  Calcium 8.7 - 10.2 mg/dL 9.6 - 9.4  Total Protein 6.0 - 8.3 g/dL - 7.8 7.7  Total Bilirubin 0.2 - 1.2 mg/dL - 0.5 0.4  Alkaline Phos 39 - 117 U/L - 141(H) 154(H)  AST 0 - 37  U/L - 62(H) 48(H)  ALT 0 - 53 U/L - 37 28    Assessment and Plan: 1. Facial dermatitis Uncontrolled Increased dose of triamcinolone ointment If symptoms do not improve he has been advised to notify the clinic for referral to dermatology - triamcinolone ointment (KENALOG) 0.5 %; Apply 1 application topically 2 (two) times daily.  Dispense: 30 g; Refill: 1  2. Essential hypertension Uncontrolled Switched from losartan to losartan HCTZ He will follow up with PCP in 1 month to reassess and for possible labs given addition of diuretic - losartan-hydrochlorothiazide (HYZAAR) 50-12.5 MG tablet; Take 1 tablet by mouth daily.  Dispense: 30 tablet; Refill:  1   Follow Up Instructions: 1 month with PCP   I discussed the assessment and treatment plan with the patient. The patient was provided an opportunity to ask questions and all were answered. The patient agreed with the plan and demonstrated an understanding of the instructions.   The patient was advised to call back or seek an in-person evaluation if the symptoms worsen or if the condition fails to improve as anticipated.     I provided 12 minutes total of non-face-to-face time during this encounter.   [name], MD, FAAFP. [facility] [place], [place] [phone]   12/18/2020, 11:13 AM

## 2020-12-18 NOTE — Progress Notes (Signed)
-  Reports cream for face making face worse, needs to switch to another cream  -RF on BP medication   -Requesting labs

## 2020-12-18 NOTE — Telephone Encounter (Signed)
Att to contact pt to advise of med change after checking BP, due to pt was in hurry before provider could be advised. No ans lvm

## 2021-01-20 ENCOUNTER — Encounter (HOSPITAL_COMMUNITY): Payer: Self-pay

## 2021-01-20 ENCOUNTER — Emergency Department (HOSPITAL_COMMUNITY)
Admission: EM | Admit: 2021-01-20 | Discharge: 2021-01-20 | Disposition: A | Discharge status: Home / Self Care | Payer: BC Managed Care – PPO | Attending: Emergency Medicine | Admitting: Emergency Medicine

## 2021-01-20 ENCOUNTER — Other Ambulatory Visit: Payer: Self-pay

## 2021-01-20 ENCOUNTER — Emergency Department (HOSPITAL_COMMUNITY): Payer: BC Managed Care – PPO

## 2021-01-20 ENCOUNTER — Telehealth: Payer: Self-pay | Admitting: Internal Medicine

## 2021-01-20 DIAGNOSIS — F1010 Alcohol abuse, uncomplicated: Secondary | ICD-10-CM | POA: Diagnosis not present

## 2021-01-20 DIAGNOSIS — Y900 Blood alcohol level of less than 20 mg/100 ml: Secondary | ICD-10-CM | POA: Diagnosis not present

## 2021-01-20 DIAGNOSIS — R42 Dizziness and giddiness: Secondary | ICD-10-CM | POA: Insufficient documentation

## 2021-01-20 DIAGNOSIS — I10 Essential (primary) hypertension: Secondary | ICD-10-CM | POA: Insufficient documentation

## 2021-01-20 DIAGNOSIS — R319 Hematuria, unspecified: Secondary | ICD-10-CM | POA: Diagnosis not present

## 2021-01-20 DIAGNOSIS — Z79899 Other long term (current) drug therapy: Secondary | ICD-10-CM | POA: Diagnosis not present

## 2021-01-20 DIAGNOSIS — Z8616 Personal history of COVID-19: Secondary | ICD-10-CM | POA: Diagnosis not present

## 2021-01-20 DIAGNOSIS — F17210 Nicotine dependence, cigarettes, uncomplicated: Secondary | ICD-10-CM | POA: Insufficient documentation

## 2021-01-20 DIAGNOSIS — R7401 Elevation of levels of liver transaminase levels: Secondary | ICD-10-CM | POA: Insufficient documentation

## 2021-01-20 DIAGNOSIS — R000 Tachycardia, unspecified: Secondary | ICD-10-CM | POA: Insufficient documentation

## 2021-01-20 DIAGNOSIS — G9389 Other specified disorders of brain: Secondary | ICD-10-CM | POA: Diagnosis not present

## 2021-01-20 DIAGNOSIS — I444 Left anterior fascicular block: Secondary | ICD-10-CM | POA: Diagnosis not present

## 2021-01-20 LAB — DIFFERENTIAL
Abs Immature Granulocytes: 0.03 10*3/uL (ref 0.00–0.07)
Basophils Absolute: 0.1 10*3/uL (ref 0.0–0.1)
Basophils Relative: 1 %
Eosinophils Absolute: 0 10*3/uL (ref 0.0–0.5)
Eosinophils Relative: 0 %
Immature Granulocytes: 0 %
Lymphocytes Relative: 17 %
Lymphs Abs: 1.7 10*3/uL (ref 0.7–4.0)
Monocytes Absolute: 0.7 10*3/uL (ref 0.1–1.0)
Monocytes Relative: 7 %
Neutro Abs: 7.3 10*3/uL (ref 1.7–7.7)
Neutrophils Relative %: 75 %

## 2021-01-20 LAB — COMPREHENSIVE METABOLIC PANEL
ALT: 58 U/L — ABNORMAL HIGH (ref 0–44)
AST: 126 U/L — ABNORMAL HIGH (ref 15–41)
Albumin: 4.6 g/dL (ref 3.5–5.0)
Alkaline Phosphatase: 178 U/L — ABNORMAL HIGH (ref 38–126)
Anion gap: 12 (ref 5–15)
BUN: 13 mg/dL (ref 6–20)
CO2: 27 mmol/L (ref 22–32)
Calcium: 9.7 mg/dL (ref 8.9–10.3)
Chloride: 93 mmol/L — ABNORMAL LOW (ref 98–111)
Creatinine, Ser: 0.97 mg/dL (ref 0.61–1.24)
GFR, Estimated: >60 mL/min (ref >60)
Glucose, Bld: 134 mg/dL — ABNORMAL HIGH (ref 70–99)
Potassium: 4.5 mmol/L (ref 3.5–5.1)
Sodium: 132 mmol/L — ABNORMAL LOW (ref 135–145)
Total Bilirubin: 1.9 mg/dL — ABNORMAL HIGH (ref 0.3–1.2)
Total Protein: 8.7 g/dL — ABNORMAL HIGH (ref 6.5–8.1)

## 2021-01-20 LAB — CBC
HCT: 46.8 % (ref 39.0–52.0)
Hemoglobin: 16 g/dL (ref 13.0–17.0)
MCH: 31.1 pg (ref 26.0–34.0)
MCHC: 34.2 g/dL (ref 30.0–36.0)
MCV: 91.1 fL (ref 80.0–100.0)
Platelets: 170 10*3/uL (ref 150–400)
RBC: 5.14 MIL/uL (ref 4.22–5.81)
RDW: 13.2 % (ref 11.5–15.5)
WBC: 9.9 10*3/uL (ref 4.0–10.5)
nRBC: 0 % (ref 0.0–0.2)

## 2021-01-20 LAB — RAPID URINE DRUG SCREEN, HOSP PERFORMED
Amphetamines: NOT DETECTED
Barbiturates: NOT DETECTED
Benzodiazepines: NOT DETECTED
Cocaine: NOT DETECTED
Opiates: NOT DETECTED
Tetrahydrocannabinol: NOT DETECTED

## 2021-01-20 LAB — URINALYSIS, ROUTINE W REFLEX MICROSCOPIC
Bacteria, UA: NONE SEEN
Bilirubin Urine: NEGATIVE
Glucose, UA: NEGATIVE mg/dL
Ketones, ur: NEGATIVE mg/dL
Leukocytes,Ua: NEGATIVE
Nitrite: NEGATIVE
Protein, ur: 100 mg/dL — AB
Specific Gravity, Urine: 1.02 (ref 1.005–1.030)
pH: 5 (ref 5.0–8.0)

## 2021-01-20 LAB — APTT: aPTT: 29 seconds (ref 24–36)

## 2021-01-20 LAB — PROTIME-INR
INR: 0.9 (ref 0.8–1.2)
Prothrombin Time: 11.9 seconds (ref 11.4–15.2)

## 2021-01-20 LAB — ETHANOL: Alcohol, Ethyl (B): <10 mg/dL (ref <10)

## 2021-01-20 MED ORDER — LORAZEPAM 2 MG/ML IJ SOLN: 0.0000 mg | Freq: Four times a day (QID) | INTRAMUSCULAR | Status: DC

## 2021-01-20 MED ORDER — CHLORDIAZEPOXIDE HCL 25 MG PO CAPS: ORAL_CAPSULE | ORAL | 0 refills | Status: DC

## 2021-01-20 MED ORDER — LORAZEPAM 2 MG/ML IJ SOLN: 0.0000 mg | Freq: Two times a day (BID) | INTRAMUSCULAR | Status: DC

## 2021-01-20 MED ORDER — LORAZEPAM 1 MG PO TABS
0.0000 mg | ORAL_TABLET | Freq: Four times a day (QID) | ORAL | Status: DC
  Administered 2021-01-20: 1 mg via ORAL
  Filled 2021-01-20: qty 1

## 2021-01-20 MED ORDER — THIAMINE HCL 100 MG/ML IJ SOLN: 100.0000 mg | Freq: Every day | INTRAMUSCULAR | Status: DC

## 2021-01-20 MED ORDER — LORAZEPAM 1 MG PO TABS: 0.0000 mg | ORAL_TABLET | Freq: Two times a day (BID) | ORAL | Status: DC

## 2021-01-20 MED ORDER — THIAMINE HCL 100 MG PO TABS
100.0000 mg | ORAL_TABLET | Freq: Every day | ORAL | Status: DC
  Administered 2021-01-20: 100 mg via ORAL
  Filled 2021-01-20: qty 1

## 2021-01-20 NOTE — Discharge Instructions (Signed)
Take the Librium if you decide to stop drinking alcohol.  Do not take this if you continue to drink alcohol. Follow-up with your primary care provider and the urologist regarding your bloody urine. Return to the ER if you start to experience worsening dizziness, headache, chest pain or shortness of breath

## 2021-01-20 NOTE — ED Triage Notes (Signed)
Pt arrived via walk in, c/o dizziness x3 days. States recent change to BP meds that he feels are the cause. No neuro deficits, Aox4. Denies any other sx.

## 2021-01-20 NOTE — ED Notes (Signed)
Pt ambulated by tech with success

## 2021-01-20 NOTE — ED Notes (Signed)
Going ct

## 2021-01-20 NOTE — ED Notes (Signed)
Pt asked for urine/ bottle and jug on bed ready for sample

## 2021-01-20 NOTE — ED Provider Notes (Signed)
[facility]-EMERGENCY DEPT Provider Note   CSN: [id number] Arrival date & time: 01/20/21  3664     History Chief Complaint  Patient presents with  . Dizziness    Davidson Palmieri is a 55 y.o. male with a past medical history of alcohol abuse, hypertension presenting to the ED with a chief complaint of dizziness.  States that for the past 3 days he has been feeling dizzy, lightheaded and he is unsure if this is related to his recent increase in blood pressure medication as prescribed by his PCP.  States that he does tend to sometimes feel off balance when he ambulates but has not fallen to the ground and usually "catches myself."  Denies any chest pain, abdominal pain.  Reports vomiting yesterday.  Reports ongoing nausea.  Reports diarrhea yesterday as well.  Also noticed yesterday that he had some hematuria.  He states that this happened to him in the past due to his drinking.  He does admit that he drinks about 4 bottles of beer daily but has not drank any alcohol since 01/17/2021 because "I just did not feel good."  He states that he does feel shaky but denies any prior alcohol withdrawal seizures or DTs in the past. Denies any headache, vision changes, numbness in arms or legs, weakness.  HPI     Past Medical History:  Diagnosis Date  . Allergy    SEASONAL  . Arthritis   . Heart murmur   . Hypertension     Patient Active Problem List   Diagnosis Date Noted  . Acute upper GI bleed 02/26/2020  . COVID-19 virus infection 02/26/2020  . Essential hypertension 11/28/2019  . Neuropathy, alcoholic (HCC) 11/28/2019  . Elevated PSA 11/28/2019  . Elevated liver function tests 11/28/2019  . Prediabetes 11/28/2019  . Alcohol abuse 11/28/2019    Past Surgical History:  Procedure Laterality Date  . NO PAST SURGERIES         Family History  Problem Relation Age of Onset  . Diabetes Mother   . Breast cancer Sister   . Diabetes Sister   . Heart murmur Sister   .  Diabetes Brother   . Colon cancer Neg Hx   . Colon polyps Neg Hx   . Esophageal cancer Neg Hx   . Rectal cancer Neg Hx   . Stomach cancer Neg Hx   . Ovarian cancer Neg Hx     Social History   Tobacco Use  . Smoking status: Current Every Day Smoker    Packs/day: 0.50    Years: 38.00    Pack years: 19.00    Types: Cigarettes  . Smokeless tobacco: Never Used  Vaping Use  . Vaping Use: Never used  Substance Use Topics  . Alcohol use: Yes    Comment: 3- 40 ounce beers daily;  h/o heavy use, decreased use about a year ago  . Drug use: No    Home Medications Prior to Admission medications   Medication Sig Start Date End Date Taking? Authorizing Provider  chlordiazePOXIDE (LIBRIUM) 25 MG capsule  PO TID x 1D, then 25-50mg  PO BID X 1D, then 25-50mg  PO QD X 1D 01/20/21  Yes Othal Kubitz, PA-C  ferrous sulfate 325 (65 FE) MG tablet Take 325 mg by mouth daily with breakfast.    [provider]  gabapentin (NEURONTIN) 300 MG capsule Take 1 capsule (300 mg total) by mouth at bedtime. 06/06/19   [name], MD  hydrocortisone (ANUSOL-HC) 2.5 % rectal  cream Place 1 application rectally 2 (two) times daily. 10/12/20   Hall-Potvin, [place], PA-C  losartan-hydrochlorothiazide (HYZAAR) 50-12.5 MG tablet Take 1 tablet by mouth daily. 12/18/20   [name], MD  Multiple Vitamin (MULTIVITAMIN) tablet Take 1 tablet by mouth daily. 10/02/20   Rema Fendt, NP  triamcinolone ointment (KENALOG) 0.5 % Apply 1 application topically 2 (two) times daily. 12/18/20   [name], MD  hydrochlorothiazide (HYDRODIURIL) 25 MG tablet Take 1 tablet (25 mg total) by mouth daily. 06/02/16 04/02/19  [name], PA-C  omeprazole (PRILOSEC) 20 MG capsule Take 1 capsule (20 mg total) by mouth daily. 04/25/17 04/02/19  [name], MD  promethazine (PHENERGAN) 25 MG tablet Take 1 tablet (25 mg total) by mouth every 8 (eight) hours as needed for nausea or vomiting. 04/25/17 04/02/19   [name], MD    Allergies    Patient has no known allergies.  Review of Systems   Review of Systems  Constitutional: Negative for appetite change, chills and fever.  HENT: Negative for ear pain, rhinorrhea, sneezing and sore throat.   Eyes: Negative for photophobia and visual disturbance.  Respiratory: Negative for cough, chest tightness, shortness of breath and wheezing.   Cardiovascular: Negative for chest pain and palpitations.  Gastrointestinal: Positive for nausea and vomiting. Negative for abdominal pain, blood in stool, constipation and diarrhea.  Genitourinary: Negative for dysuria, hematuria and urgency.  Musculoskeletal: Negative for myalgias.  Skin: Negative for rash.  Neurological: Positive for dizziness, tremors and light-headedness. Negative for weakness.    Physical Exam Updated Vital Signs BP (!) 138/93   Pulse (!) 102   Temp 98.7 F (37.1 C) (Oral)   Resp 14   SpO2 99%   Physical Exam Vitals and nursing note reviewed.  Constitutional:      General: He is not in acute distress.    Appearance: He is well-developed.     Comments: Ambulatory with normal gait.  No ataxia  HENT:     Head: Normocephalic and atraumatic.     Nose: Nose normal.  Eyes:     General: No scleral icterus.       Right eye: No discharge.        Left eye: No discharge.     Conjunctiva/sclera: Conjunctivae normal.     Pupils: Pupils are equal, round, and reactive to light.  Cardiovascular:     Rate and Rhythm: Regular rhythm. Tachycardia present.     Heart sounds: Normal heart sounds. No murmur heard. No friction rub. No gallop.   Pulmonary:     Effort: Pulmonary effort is normal. No respiratory distress.     Breath sounds: Normal breath sounds.  Abdominal:     General: Bowel sounds are normal. There is no distension.     Palpations: Abdomen is soft.     Tenderness: There is no abdominal tenderness. There is no guarding.  Musculoskeletal:        General: Normal range of  motion.     Cervical back: Normal range of motion and neck supple.  Skin:    General: Skin is warm and dry.     Findings: No rash.  Neurological:     Mental Status: He is alert and oriented to person, place, and time.     Cranial Nerves: No cranial nerve deficit.     Sensory: No sensory deficit.     Motor: No weakness or abnormal muscle tone.     Coordination: Coordination normal.     Comments:  Ambulatory without difficulty. Pupils reactive. No facial asymmetry noted. Cranial nerves appear grossly intact. Sensation intact to light touch on face, BUE and BLE. Strength 5/5 in BUE and BLE.  Normal finger-to-nose coordination bilaterally.  Patient does have coarse tremors noted in bilateral upper extremities. These stop when I am speaking to him.     ED Results / Procedures / Treatments   Labs (all labs ordered are listed, but only abnormal results are displayed) Labs Reviewed  COMPREHENSIVE METABOLIC PANEL - Abnormal; Notable for the following components:      Result Value   Sodium 132 (*)    Chloride 93 (*)    Glucose, Bld 134 (*)    Total Protein 8.7 (*)    AST 126 (*)    ALT 58 (*)    Alkaline Phosphatase 178 (*)    Total Bilirubin 1.9 (*)    All other components within normal limits  URINALYSIS, ROUTINE W REFLEX MICROSCOPIC - Abnormal; Notable for the following components:   Color, Urine AMBER (*)    Hgb urine dipstick SMALL (*)    Protein, ur 100 (*)    All other components within normal limits  ETHANOL  PROTIME-INR  APTT  CBC  DIFFERENTIAL  RAPID URINE DRUG SCREEN, HOSP PERFORMED    EKG EKG Interpretation  Date/Time:  Wednesday January 20 2021 10:04:24 EDT Ventricular Rate:  84 PR Interval:  167 QRS Duration: 104 QT Interval:  377 QTC Calculation: 446 R Axis:   -70 Text Interpretation: Sinus rhythm Biatrial enlargement LAD, consider left anterior fascicular block Abnormal T, consider ischemia, lateral leads NSR. T wave Confirmed by Coralee PesaHorton, Kristie [phone]([id number]) on  01/20/2021 10:15:42 AM   Radiology CT HEAD WO CONTRAST  Result Date: 01/20/2021 CLINICAL DATA:  Vertigo EXAM: CT HEAD WITHOUT CONTRAST TECHNIQUE: Contiguous axial images were obtained from the base of the skull through the vertex without intravenous contrast. COMPARISON:  None. FINDINGS: Brain: No evidence of acute infarction, hemorrhage, hydrocephalus, extra-axial collection or mass lesion/mass effect. Cerebral cortical volume loss slightly advanced for age. Vascular: No hyperdense vessel or unexpected calcification. Skull: Normal. Negative for fracture or focal lesion. Sinuses/Orbits: No acute finding. Other: None. IMPRESSION: 1. No acute intracranial abnormality. 2. Age advanced cerebral cortical volume loss. Electronically Signed   By: Malachy MoanHeath  McCullough M.D.   On: 01/20/2021 11:24    Procedures Procedures   Medications Ordered in ED Medications  LORazepam (ATIVAN) injection 0-4 mg ( Intravenous See Alternative 01/20/21 1020)    Or  LORazepam (ATIVAN) tablet 0-4 mg (1 mg Oral Given 01/20/21 1020)  LORazepam (ATIVAN) injection 0-4 mg (has no administration in time range)    Or  LORazepam (ATIVAN) tablet 0-4 mg (has no administration in time range)  thiamine tablet 100 mg (100 mg Oral Given 01/20/21 1021)    Or  thiamine (B-1) injection 100 mg ( Intravenous See Alternative 01/20/21 1021)    ED Course  I have reviewed the triage vital signs and the nursing notes.  Pertinent labs & imaging results that were available during my care of the patient were reviewed by me and considered in my medical decision making (see chart for details).  Clinical Course as of 01/20/21 1259  Wed Jan 20, 2021  1043 ALT(!): 58 [HK]  1043 AST(!): 126 [HK]  1043 Alkaline Phosphatase(!): 178 [HK]  1043 Total Bilirubin(!): 1.9 [HK]  1043 Alcohol, Ethyl (B): <10 [HK]  1138 WBC: 9.9 [HK]  1138 CT HEAD WO CONTRAST No abnormal findings per [HK]  1146  Urine rapid drug screen (hosp performed) Negative [HK]  1217 Hgb  urine dipstick(!): SMALL [HK]    Clinical Course User Index [HK] Dietrich Pates, PA-C   MDM Rules/Calculators/A&P                          55 year old male with past medical history of alcohol abuse, hypertension presenting to the ED with a chief complaint of dizziness.  For the past 3 days has been feeling dizzy, lightheaded and is unsure if this is related to recent increase in blood pressure medication as prescribed by PCP.  States that he sometimes feels off balance when he ambulates but is able to "catch myself."  Denies any chest pain, abdominal pain.  He denies any fevers.  He also reports hematuria that has happened to him before and was told it was due to his chronic alcohol use.  Denies any dysuria.  No flank pain.  On exam patient without neurological deficits noted.  He has no numbness or weakness on exam.  He is ambulating without difficulty, no ataxia noted.  Normal coordination noted.  No facial asymmetry noted.  No signs of respiratory distress. Patient states that he stopped drinking alcohol about 3 days ago because he was not feeling well.  Work appear significant for slight elevation in LFTs which I feel is most likely secondary to his alcohol use.  No leukocytosis.  EKG shows sinus rhythm no ischemic changes, no STEMI.  CT of the head without any acute findings, no signs of masses or bleeds.  Urinalysis with some hemoglobin but otherwise unremarkable.  Ethanol level is 0.  CBC is unremarkable.  Patient with a CIWA score of 8.  He was given 1 mg of Ativan as well as IV fluids with significant improvement in his symptoms.  He does have a tremor on exam although this improves when I am speaking to him.  He continues to have no neurological deficit so I doubt his symptoms are due to stroke or other acute neurological cause.  I did offer Librium for the patient if he plans to stop drinking alcohol and to do so safely and he agrees.  He has capacity to make decisions and is not suicidal or  homicidal and he declines admission at this time as he feels like his dizziness may be due to his alcohol withdrawal versus increase in his blood pressure medication.  I urged him to follow-up with his primary care provider if he is concerned about medication side effects.  Regarding his hematuria, he is denying any pain and there are no signs of infection noted so we will refer him to urology for ongoing work-up.  Patient is agreeable to the plan.  Return precautions given.   Patient is hemodynamically stable, in NAD, and able to ambulate in the ED. Evaluation does not show pathology that would require ongoing emergent intervention or inpatient treatment. I explained the diagnosis to the patient. Pain has been managed and has no complaints prior to discharge. Patient is comfortable with above plan and is stable for discharge at this time. All questions were answered prior to disposition. Strict return precautions for returning to the ED were discussed. Encouraged follow up with PCP.   An After Visit Summary was printed and given to the patient.   Portions of this note were generated with Scientist, clinical (histocompatibility and immunogenetics). Dictation errors may occur despite best attempts at proofreading.  Final Clinical Impression(s) / ED Diagnoses Final diagnoses:  Alcohol abuse  Transaminitis  Painless hematuria    Rx / DC Orders ED Discharge Orders         Ordered    chlordiazePOXIDE (LIBRIUM) 25 MG capsule        01/20/21 1259           Dietrich Pates, PA-C 01/20/21 1259    Horton, Clabe Seal, DO 01/20/21 1544

## 2021-01-20 NOTE — Telephone Encounter (Signed)
Pt is calling about his losartan-hydrochlorothiazide (HYZAAR) 50-12.5 MG tablet. He has not been feeling well since taking this medication.  Pt states that he has been feeling nauseous, throwing up, having cold sweats, not able to sleep or go to work. Please follow up with pt in regards to his concerns. Pt would like to talk to someone from the clinical team. His cell is [phone].

## 2021-01-22 NOTE — Telephone Encounter (Signed)
Patient went to ED same day of phone call.   Noted in the ED visit, his dizziness may be due to his alcohol withdrawal versus increase in his blood pressure medication.

## 2021-02-05 ENCOUNTER — Other Ambulatory Visit: Payer: Self-pay

## 2021-02-05 ENCOUNTER — Emergency Department (HOSPITAL_BASED_OUTPATIENT_CLINIC_OR_DEPARTMENT_OTHER): Payer: BC Managed Care – PPO

## 2021-02-05 ENCOUNTER — Encounter (HOSPITAL_BASED_OUTPATIENT_CLINIC_OR_DEPARTMENT_OTHER): Payer: Self-pay

## 2021-02-05 ENCOUNTER — Inpatient Hospital Stay (HOSPITAL_BASED_OUTPATIENT_CLINIC_OR_DEPARTMENT_OTHER)
Admission: EM | Admit: 2021-02-05 | Discharge: 2021-02-10 | DRG: 896 | Disposition: A | Discharge status: Home / Self Care | Payer: BC Managed Care – PPO | Attending: Family Medicine | Admitting: Family Medicine

## 2021-02-05 ENCOUNTER — Ambulatory Visit
Admission: EM | Admit: 2021-02-05 | Discharge: 2021-02-05 | Disposition: A | Discharge status: Home / Self Care | Payer: BC Managed Care – PPO | Attending: Family Medicine | Admitting: Family Medicine

## 2021-02-05 DIAGNOSIS — Z79899 Other long term (current) drug therapy: Secondary | ICD-10-CM

## 2021-02-05 DIAGNOSIS — Z8616 Personal history of COVID-19: Secondary | ICD-10-CM

## 2021-02-05 DIAGNOSIS — R627 Adult failure to thrive: Secondary | ICD-10-CM | POA: Diagnosis present

## 2021-02-05 DIAGNOSIS — R159 Full incontinence of feces: Secondary | ICD-10-CM | POA: Diagnosis not present

## 2021-02-05 DIAGNOSIS — F17210 Nicotine dependence, cigarettes, uncomplicated: Secondary | ICD-10-CM | POA: Diagnosis present

## 2021-02-05 DIAGNOSIS — K7010 Alcoholic hepatitis without ascites: Secondary | ICD-10-CM

## 2021-02-05 DIAGNOSIS — Z20822 Contact with and (suspected) exposure to covid-19: Secondary | ICD-10-CM | POA: Diagnosis not present

## 2021-02-05 DIAGNOSIS — Z833 Family history of diabetes mellitus: Secondary | ICD-10-CM | POA: Diagnosis not present

## 2021-02-05 DIAGNOSIS — K2920 Alcoholic gastritis without bleeding: Secondary | ICD-10-CM

## 2021-02-05 DIAGNOSIS — G621 Alcoholic polyneuropathy: Secondary | ICD-10-CM | POA: Diagnosis present

## 2021-02-05 DIAGNOSIS — E871 Hypo-osmolality and hyponatremia: Principal | ICD-10-CM | POA: Diagnosis present

## 2021-02-05 DIAGNOSIS — R32 Unspecified urinary incontinence: Secondary | ICD-10-CM | POA: Diagnosis not present

## 2021-02-05 DIAGNOSIS — I10 Essential (primary) hypertension: Secondary | ICD-10-CM | POA: Diagnosis present

## 2021-02-05 DIAGNOSIS — R112 Nausea with vomiting, unspecified: Secondary | ICD-10-CM

## 2021-02-05 DIAGNOSIS — K760 Fatty (change of) liver, not elsewhere classified: Secondary | ICD-10-CM | POA: Diagnosis not present

## 2021-02-05 DIAGNOSIS — M1990 Unspecified osteoarthritis, unspecified site: Secondary | ICD-10-CM | POA: Diagnosis not present

## 2021-02-05 DIAGNOSIS — Y905 Blood alcohol level of 100-119 mg/100 ml: Secondary | ICD-10-CM | POA: Diagnosis present

## 2021-02-05 DIAGNOSIS — L03314 Cellulitis of groin: Secondary | ICD-10-CM | POA: Diagnosis present

## 2021-02-05 DIAGNOSIS — Z28311 Partially vaccinated for covid-19: Secondary | ICD-10-CM

## 2021-02-05 DIAGNOSIS — F10239 Alcohol dependence with withdrawal, unspecified: Secondary | ICD-10-CM | POA: Diagnosis present

## 2021-02-05 DIAGNOSIS — Z7141 Alcohol abuse counseling and surveillance of alcoholic: Secondary | ICD-10-CM

## 2021-02-05 DIAGNOSIS — E860 Dehydration: Secondary | ICD-10-CM | POA: Diagnosis present

## 2021-02-05 DIAGNOSIS — J302 Other seasonal allergic rhinitis: Secondary | ICD-10-CM | POA: Diagnosis not present

## 2021-02-05 DIAGNOSIS — R7989 Other specified abnormal findings of blood chemistry: Secondary | ICD-10-CM | POA: Diagnosis present

## 2021-02-05 DIAGNOSIS — R972 Elevated prostate specific antigen [PSA]: Secondary | ICD-10-CM | POA: Diagnosis not present

## 2021-02-05 DIAGNOSIS — Z6820 Body mass index (BMI) 20.0-20.9, adult: Secondary | ICD-10-CM | POA: Diagnosis not present

## 2021-02-05 DIAGNOSIS — D696 Thrombocytopenia, unspecified: Secondary | ICD-10-CM | POA: Diagnosis present

## 2021-02-05 DIAGNOSIS — K921 Melena: Secondary | ICD-10-CM

## 2021-02-05 DIAGNOSIS — Z8719 Personal history of other diseases of the digestive system: Secondary | ICD-10-CM

## 2021-02-05 DIAGNOSIS — F10230 Alcohol dependence with withdrawal, uncomplicated: Secondary | ICD-10-CM | POA: Diagnosis not present

## 2021-02-05 DIAGNOSIS — R197 Diarrhea, unspecified: Secondary | ICD-10-CM

## 2021-02-05 DIAGNOSIS — R21 Rash and other nonspecific skin eruption: Secondary | ICD-10-CM | POA: Diagnosis present

## 2021-02-05 DIAGNOSIS — F10939 Alcohol use, unspecified with withdrawal, unspecified: Secondary | ICD-10-CM | POA: Diagnosis present

## 2021-02-05 DIAGNOSIS — R1011 Right upper quadrant pain: Secondary | ICD-10-CM | POA: Diagnosis not present

## 2021-02-05 DIAGNOSIS — R1110 Vomiting, unspecified: Secondary | ICD-10-CM | POA: Diagnosis not present

## 2021-02-05 DIAGNOSIS — R000 Tachycardia, unspecified: Secondary | ICD-10-CM | POA: Diagnosis not present

## 2021-02-05 DIAGNOSIS — R7303 Prediabetes: Secondary | ICD-10-CM | POA: Diagnosis present

## 2021-02-05 DIAGNOSIS — E43 Unspecified severe protein-calorie malnutrition: Secondary | ICD-10-CM | POA: Diagnosis present

## 2021-02-05 DIAGNOSIS — R634 Abnormal weight loss: Secondary | ICD-10-CM | POA: Diagnosis not present

## 2021-02-05 DIAGNOSIS — F1029 Alcohol dependence with unspecified alcohol-induced disorder: Secondary | ICD-10-CM

## 2021-02-05 HISTORY — DX: Alcohol dependence, uncomplicated: F10.20

## 2021-02-05 HISTORY — DX: Prediabetes: R73.03

## 2021-02-05 HISTORY — DX: Anemia, unspecified: D64.9

## 2021-02-05 LAB — URINALYSIS, ROUTINE W REFLEX MICROSCOPIC
Bilirubin Urine: NEGATIVE
Glucose, UA: 100 mg/dL — AB
Ketones, ur: NEGATIVE mg/dL
Leukocytes,Ua: NEGATIVE
Nitrite: NEGATIVE
Protein, ur: NEGATIVE mg/dL
Specific Gravity, Urine: 1.006 (ref 1.005–1.030)
pH: 5.5 (ref 5.0–8.0)

## 2021-02-05 LAB — CBC WITH DIFFERENTIAL/PLATELET
Abs Immature Granulocytes: 0.08 10*3/uL — ABNORMAL HIGH (ref 0.00–0.07)
Basophils Absolute: 0 10*3/uL (ref 0.0–0.1)
Basophils Relative: 0 %
Eosinophils Absolute: 0 10*3/uL (ref 0.0–0.5)
Eosinophils Relative: 0 %
HCT: 35.9 % — ABNORMAL LOW (ref 39.0–52.0)
Hemoglobin: 13.5 g/dL (ref 13.0–17.0)
Immature Granulocytes: 1 %
Lymphocytes Relative: 23 %
Lymphs Abs: 2.1 10*3/uL (ref 0.7–4.0)
MCH: 31.4 pg (ref 26.0–34.0)
MCHC: 37.6 g/dL — ABNORMAL HIGH (ref 30.0–36.0)
MCV: 83.5 fL (ref 80.0–100.0)
Monocytes Absolute: 0.6 10*3/uL (ref 0.1–1.0)
Monocytes Relative: 7 %
Neutro Abs: 6 10*3/uL (ref 1.7–7.7)
Neutrophils Relative %: 69 %
Platelets: 139 10*3/uL — ABNORMAL LOW (ref 150–400)
RBC: 4.3 MIL/uL (ref 4.22–5.81)
RDW: 12.4 % (ref 11.5–15.5)
WBC: 8.8 10*3/uL (ref 4.0–10.5)
nRBC: 0.3 % — ABNORMAL HIGH (ref 0.0–0.2)

## 2021-02-05 LAB — LIPASE, BLOOD: Lipase: 58 U/L — ABNORMAL HIGH (ref 11–51)

## 2021-02-05 LAB — RESP PANEL BY RT-PCR (FLU A&B, COVID) ARPGX2
Influenza A by PCR: NEGATIVE
Influenza B by PCR: NEGATIVE
SARS Coronavirus 2 by RT PCR: NEGATIVE

## 2021-02-05 LAB — COMPREHENSIVE METABOLIC PANEL
ALT: 105 U/L — ABNORMAL HIGH (ref 0–44)
AST: 175 U/L — ABNORMAL HIGH (ref 15–41)
Albumin: 3.8 g/dL (ref 3.5–5.0)
Alkaline Phosphatase: 368 U/L — ABNORMAL HIGH (ref 38–126)
Anion gap: 18 — ABNORMAL HIGH (ref 5–15)
BUN: 16 mg/dL (ref 6–20)
CO2: 20 mmol/L — ABNORMAL LOW (ref 22–32)
Calcium: 9.2 mg/dL (ref 8.9–10.3)
Chloride: 79 mmol/L — ABNORMAL LOW (ref 98–111)
Creatinine, Ser: 0.99 mg/dL (ref 0.61–1.24)
GFR, Estimated: >60 mL/min (ref >60)
Glucose, Bld: 87 mg/dL (ref 70–99)
Potassium: 3.7 mmol/L (ref 3.5–5.1)
Sodium: 117 mmol/L — CL (ref 135–145)
Total Bilirubin: 4.1 mg/dL — ABNORMAL HIGH (ref 0.3–1.2)
Total Protein: 7.3 g/dL (ref 6.5–8.1)

## 2021-02-05 LAB — BASIC METABOLIC PANEL
Anion gap: 13 (ref 5–15)
BUN: 15 mg/dL (ref 6–20)
CO2: 23 mmol/L (ref 22–32)
Calcium: 8.5 mg/dL — ABNORMAL LOW (ref 8.9–10.3)
Chloride: 84 mmol/L — ABNORMAL LOW (ref 98–111)
Creatinine, Ser: 0.84 mg/dL (ref 0.61–1.24)
GFR, Estimated: >60 mL/min (ref >60)
Glucose, Bld: 89 mg/dL (ref 70–99)
Potassium: 3.9 mmol/L (ref 3.5–5.1)
Sodium: 120 mmol/L — ABNORMAL LOW (ref 135–145)

## 2021-02-05 LAB — POCT URINALYSIS DIP (MANUAL ENTRY)
Glucose, UA: 250 mg/dL — AB
Ketones, POC UA: NEGATIVE mg/dL
Leukocytes, UA: NEGATIVE
Nitrite, UA: POSITIVE — AB
Spec Grav, UA: <=1.005 — AB (ref 1.010–1.025)
Urobilinogen, UA: 2 E.U./dL — AB
pH, UA: 5 (ref 5.0–8.0)

## 2021-02-05 LAB — ETHANOL: Alcohol, Ethyl (B): 118 mg/dL — ABNORMAL HIGH (ref <10)

## 2021-02-05 LAB — CBG MONITORING, ED: Glucose-Capillary: 94 mg/dL (ref 70–99)

## 2021-02-05 LAB — LACTIC ACID, PLASMA: Lactic Acid, Venous: 4.1 mmol/L (ref 0.5–1.9)

## 2021-02-05 MED ORDER — LORAZEPAM 2 MG/ML IJ SOLN: 0.0000 mg | Freq: Two times a day (BID) | INTRAMUSCULAR | Status: DC

## 2021-02-05 MED ORDER — DOXYCYCLINE HYCLATE 100 MG PO TABS: 100.0000 mg | ORAL_TABLET | Freq: Once | ORAL | Status: DC

## 2021-02-05 MED ORDER — PANTOPRAZOLE SODIUM 40 MG IV SOLR
40.0000 mg | Freq: Once | INTRAVENOUS | Status: AC
  Administered 2021-02-05: 40 mg via INTRAVENOUS
  Filled 2021-02-05: qty 40

## 2021-02-05 MED ORDER — LORAZEPAM 2 MG/ML IJ SOLN
0.0000 mg | Freq: Four times a day (QID) | INTRAMUSCULAR | Status: DC
  Administered 2021-02-05: 1 mg via INTRAVENOUS
  Filled 2021-02-05: qty 1

## 2021-02-05 MED ORDER — NYSTATIN 100000 UNIT/GM EX POWD
Freq: Once | CUTANEOUS | Status: AC
  Filled 2021-02-05: qty 15

## 2021-02-05 MED ORDER — IOHEXOL 300 MG/ML  SOLN
100.0000 mL | Freq: Once | INTRAMUSCULAR | Status: AC | PRN
  Administered 2021-02-05: 100 mL via INTRAVENOUS

## 2021-02-05 MED ORDER — ONDANSETRON HCL 4 MG/2ML IJ SOLN
4.0000 mg | Freq: Once | INTRAMUSCULAR | Status: AC
  Administered 2021-02-05: 4 mg via INTRAVENOUS
  Filled 2021-02-05: qty 2

## 2021-02-05 MED ORDER — THIAMINE HCL 100 MG PO TABS: 100.0000 mg | ORAL_TABLET | Freq: Every day | ORAL | Status: DC

## 2021-02-05 MED ORDER — FLUCONAZOLE 150 MG PO TABS
150.0000 mg | ORAL_TABLET | Freq: Once | ORAL | Status: AC
  Administered 2021-02-05: 150 mg via ORAL
  Filled 2021-02-05: qty 1

## 2021-02-05 MED ORDER — FLUCONAZOLE 200 MG PO TABS: 200.0000 mg | ORAL_TABLET | Freq: Once | ORAL | Status: DC

## 2021-02-05 MED ORDER — THIAMINE HCL 100 MG/ML IJ SOLN
100.0000 mg | Freq: Every day | INTRAMUSCULAR | Status: DC
  Administered 2021-02-05 – 2021-02-06 (×2): 100 mg via INTRAVENOUS
  Filled 2021-02-05 (×2): qty 2

## 2021-02-05 MED ORDER — THIAMINE HCL 100 MG/ML IJ SOLN: Freq: Once | INTRAVENOUS | Status: DC

## 2021-02-05 MED ORDER — LORAZEPAM 1 MG PO TABS
0.0000 mg | ORAL_TABLET | Freq: Four times a day (QID) | ORAL | Status: DC
  Administered 2021-02-06: 1 mg via ORAL
  Filled 2021-02-05: qty 1

## 2021-02-05 MED ORDER — LORAZEPAM 1 MG PO TABS: 0.0000 mg | ORAL_TABLET | Freq: Two times a day (BID) | ORAL | Status: DC

## 2021-02-05 MED ORDER — LACTATED RINGERS IV BOLUS
1000.0000 mL | Freq: Once | INTRAVENOUS | Status: AC
  Administered 2021-02-05: 1000 mL via INTRAVENOUS

## 2021-02-05 NOTE — Discharge Instructions (Signed)
[facility] [address], [place],  [address]  

## 2021-02-05 NOTE — ED Notes (Signed)
Pt voided all over the floor, housekeeping notified.

## 2021-02-05 NOTE — ED Provider Notes (Signed)
[facility] EMERGENCY DEPT Provider Note   CSN: [id number] Arrival date & time: 02/05/21  1740     History No chief complaint on file.   Cameron Rogers is a 55 y.o. male.  55 year old male with past medical history below including alcoholism, hypertension, prediabetes who presents with nausea, vomiting, and diarrhea.  Patient has had 5 days of persistent nausea and vomiting associated with diarrhea and dark stools.  He has not been able to keep anything down.  He drinks 1-2 beers daily, last drink was before he went to urgent care earlier today where he was ultimately referred to the ED for further evaluation.  He denies any associated abdominal pain.  No URI symptoms or fevers.  The history is provided by the patient.       Past Medical History:  Diagnosis Date  . Allergy    SEASONAL  . Anemia   . Arthritis   . Heart murmur   . Hypertension     Patient Active Problem List   Diagnosis Date Noted  . Acute upper GI bleed 02/26/2020  . COVID-19 virus infection 02/26/2020  . Essential hypertension 11/28/2019  . Neuropathy, alcoholic (HCC) 11/28/2019  . Elevated PSA 11/28/2019  . Elevated liver function tests 11/28/2019  . Prediabetes 11/28/2019  . Alcohol abuse 11/28/2019    Past Surgical History:  Procedure Laterality Date  . NO PAST SURGERIES         Family History  Problem Relation Age of Onset  . Diabetes Mother   . Breast cancer Sister   . Diabetes Sister   . Heart murmur Sister   . Diabetes Brother   . Colon cancer Neg Hx   . Colon polyps Neg Hx   . Esophageal cancer Neg Hx   . Rectal cancer Neg Hx   . Stomach cancer Neg Hx   . Ovarian cancer Neg Hx     Social History   Tobacco Use  . Smoking status: Current Every Day Smoker    Packs/day: 0.50    Years: 38.00    Pack years: 19.00    Types: Cigarettes  . Smokeless tobacco: Never Used  Vaping Use  . Vaping Use: Never used  Substance Use Topics  . Alcohol use: Yes    Comment:  3- 40 ounce beers daily;  h/o heavy use, decreased use about a year ago  . Drug use: No    Home Medications Prior to Admission medications   Medication Sig Start Date End Date Taking? Authorizing Provider  chlordiazePOXIDE (LIBRIUM) 25 MG capsule 50mg  PO TID x 1D, then 25-50mg  PO BID X 1D, then 25-50mg  PO QD X 1D 01/20/21   Khatri, Hina, PA-C  ferrous sulfate 325 (65 FE) MG tablet Take 325 mg by mouth daily with breakfast.    [provider]  gabapentin (NEURONTIN) 300 MG capsule Take 1 capsule (300 mg total) by mouth at bedtime. 06/06/19   06/08/19, MD  hydrocortisone (ANUSOL-HC) 2.5 % rectal cream Place 1 application rectally 2 (two) times daily. 10/12/20   Hall-Potvin, 10/14/20, PA-C  losartan-hydrochlorothiazide (HYZAAR) 50-12.5 MG tablet Take 1 tablet by mouth daily. 12/18/20   02/17/21, MD  Multiple Vitamin (MULTIVITAMIN) tablet Take 1 tablet by mouth daily. 10/02/20   10/04/20, NP  triamcinolone ointment (KENALOG) 0.5 % Apply 1 application topically 2 (two) times daily. 12/18/20   02/17/21, MD  hydrochlorothiazide (HYDRODIURIL) 25 MG tablet Take 1 tablet (25 mg total) by mouth daily. 06/02/16 04/02/19  McClung,  Marzella Schlein, PA-C  omeprazole (PRILOSEC) 20 MG capsule Take 1 capsule (20 mg total) by mouth daily. 04/25/17 04/02/19  [name], MD  promethazine (PHENERGAN) 25 MG tablet Take 1 tablet (25 mg total) by mouth every 8 (eight) hours as needed for nausea or vomiting. 04/25/17 04/02/19  [name], MD    Allergies    Patient has no known allergies.  Review of Systems   Review of Systems All other systems reviewed and are negative except that which was mentioned in HPI  Physical Exam Updated Vital Signs BP 133/81   Pulse (!) 101   Temp 99.1 F (37.3 C)   Resp (!) 27   Ht 6\' 2"  (1.88 m)   Wt 73.6 kg   SpO2 100%   BMI 20.84 kg/m   Physical Exam Vitals and nursing note reviewed. Exam conducted with a chaperone present.   Constitutional:      General: He is not in acute distress.    Appearance: Normal appearance.     Comments: Disheveled and chronically ill-appearing  HENT:     Head: Normocephalic and atraumatic.  Eyes:     Conjunctiva/sclera: Conjunctivae normal.  Cardiovascular:     Rate and Rhythm: Normal rate and regular rhythm.     Heart sounds: Normal heart sounds. No murmur heard.   Pulmonary:     Effort: Pulmonary effort is normal.     Breath sounds: Normal breath sounds.  Abdominal:     General: Abdomen is flat. Bowel sounds are normal. There is distension (mild).     Palpations: Abdomen is soft.     Tenderness: There is no abdominal tenderness.  Genitourinary:    Penis: Normal.      Comments: Brown, non melenotic liquid stool in groin; skin breakdown with erythema on L side of L scrotum with no induration, crepitus, or swelling Musculoskeletal:     Right lower leg: No edema.     Left lower leg: No edema.  Skin:    General: Skin is warm and dry.     Comments: Scattered scabs on forehead  Neurological:     Mental Status: He is alert and oriented to person, place, and time.     Comments: fluent  Psychiatric:        Mood and Affect: Mood normal.        Behavior: Behavior normal.     ED Results / Procedures / Treatments   Labs (all labs ordered are listed, but only abnormal results are displayed) Labs Reviewed  COMPREHENSIVE METABOLIC PANEL - Abnormal; Notable for the following components:      Result Value   Sodium 117 (*)    Chloride 79 (*)    CO2 20 (*)    AST 175 (*)    ALT 105 (*)    Alkaline Phosphatase 368 (*)    Total Bilirubin 4.1 (*)    Anion gap 18 (*)    All other components within normal limits  CBC WITH DIFFERENTIAL/PLATELET - Abnormal; Notable for the following components:   HCT 35.9 (*)    MCHC 37.6 (*)    Platelets 139 (*)    nRBC 0.3 (*)    Abs Immature Granulocytes 0.08 (*)    All other components within normal limits  LACTIC ACID, PLASMA -  Abnormal; Notable for the following components:   Lactic Acid, Venous 4.1 (*)    All other components within normal limits  LIPASE, BLOOD - Abnormal; Notable for the following components:   Lipase  58 (*)    All other components within normal limits  ETHANOL - Abnormal; Notable for the following components:   Alcohol, Ethyl (B) 118 (*)    All other components within normal limits  URINALYSIS, ROUTINE W REFLEX MICROSCOPIC - Abnormal; Notable for the following components:   Glucose, UA 100 (*)    Hgb urine dipstick TRACE (*)    All other components within normal limits  RESP PANEL BY RT-PCR (FLU A&B, COVID) [id number]  GASTROINTESTINAL PANEL BY PCR, STOOL (REPLACES STOOL CULTURE)  CBG MONITORING, ED    EKG EKG Interpretation  Date/Time:  Friday February 05 2021 17:51:53 EDT Ventricular Rate:  103 PR Interval:  170 QRS Duration: 110 QT Interval:  343 QTC Calculation: 449 R Axis:   -77 Text Interpretation: Sinus tachycardia Prominent P waves, nondiagnostic Left axis deviation rate faster otherwise similar to previous Confirmed by Frederick Peers [phone]) on 02/05/2021 6:06:20 PM   Radiology CT Abdomen Pelvis W Contrast  Result Date: 02/05/2021 CLINICAL DATA:  Diverticulitis suspected. Nausea, vomiting, diarrhea for 5-6 days. Back pain and weakness. EXAM: CT ABDOMEN AND PELVIS WITH CONTRAST TECHNIQUE: Multidetector CT imaging of the abdomen and pelvis was performed using the standard protocol following bolus administration of intravenous contrast. CONTRAST:  OMNIPAQUE IOHEXOL 300 MG/ML  SOLN COMPARISON:  Right upper quadrant ultrasound 02/26/2020 FINDINGS: Lower chest: Lung bases are clear. No acute airspace disease or pleural fluid. Hepatobiliary: Diffusely decreased hepatic density typical of steatosis. No focal hepatic abnormality. Gallbladder physiologically distended, no calcified stone. No biliary dilatation. Pancreas: No ductal dilatation or inflammation. Spleen: Normal in size without  focal abnormality. Adrenals/Urinary Tract: Normal adrenal glands. No hydronephrosis or perinephric edema. Homogeneous renal enhancement with symmetric excretion on delayed phase imaging. No visualized renal calculi or focal renal lesion. Urinary bladder is physiologically distended without wall thickening. Stomach/Bowel: Decompressed stomach. Possible gastric wall thickening not well assessed due to nondistention. Normal positioning of the duodenum and ligament of Treitz. There is no small bowel obstruction or inflammatory change. Normal appendix. Minimal diverticulosis primarily involving the right colon. No diverticulitis. Majority of the colon is decompressed which limits detailed assessment. No obvious colonic wall thickening. No pericolonic edema Vascular/Lymphatic: Mild aortic atherosclerosis. No aortic aneurysm. Patent portal and mesenteric veins. No enlarged lymph nodes in the abdomen or pelvis. Reproductive: Mildly enlarged prostate gland. Other: No free air, free fluid, or intra-abdominal fluid collection. Musculoskeletal: There are no acute or suspicious osseous abnormalities. Facet hypertrophy in the lower lumbar spine. IMPRESSION: 1. Minimal right colonic diverticulosis without diverticulitis. 2. Possible gastric wall thickening versus nondistention, suggesting gastritis or peptic ulcer disease. 3. Mild hepatic steatosis. 4. Mildly enlarged prostate gland. Aortic Atherosclerosis (ICD10-I70.0). Electronically Signed   By: Narda Rutherford M.D.   On: 02/05/2021 19:47    Procedures Procedures  CRITICAL CARE Performed by: Ambrose Finland Cylis Ayars   Total critical care time: 30 minutes  Critical care time was exclusive of separately billable procedures and treating other patients.  Critical care was necessary to treat or prevent imminent or life-threatening deterioration.  Critical care was time spent personally by me on the following activities: development of treatment plan with patient and/or  surrogate as well as nursing, discussions with consultants, evaluation of patient's response to treatment, examination of patient, obtaining history from patient or surrogate, ordering and performing treatments and interventions, ordering and review of laboratory studies, ordering and review of radiographic studies, pulse oximetry and re-evaluation of patient's condition.   Medications Ordered in ED Medications  LORazepam (ATIVAN)  injection 0-4 mg (1 mg Intravenous Given 02/05/21 1910)    Or  LORazepam (ATIVAN) tablet 0-4 mg ( Oral See Alternative 02/05/21 1910)  LORazepam (ATIVAN) injection 0-4 mg (has no administration in time range)    Or  LORazepam (ATIVAN) tablet 0-4 mg (has no administration in time range)  thiamine (B-1) injection 100 mg (100 mg Intravenous Given 02/05/21 2013)  pantoprazole (PROTONIX) injection 40 mg (has no administration in time range)  ondansetron (ZOFRAN) injection 4 mg (4 mg Intravenous Given 02/05/21 1821)  lactated ringers bolus 1,000 mL (0 mLs Intravenous Stopped 02/05/21 1914)  iohexol (OMNIPAQUE) 300 MG/ML solution 100 mL (100 mLs Intravenous Contrast Given 02/05/21 1917)    ED Course  I have reviewed the triage vital signs and the nursing notes.  Pertinent labs & imaging results that were available during my care of the patient were reviewed by me and considered in my medical decision making (see chart for details).    MDM Rules/Calculators/A&P                          Alert, nontoxic on exam, stable vital signs.  He denied any abdominal pain and had no focal tenderness. Rash in groin appears to be combination of poor hygiene and skin breakdown from moisture. I suspect he has a component of candidal infection and possibly secondary bacterial skin infection although I see nothing on exam that is suggestive of Fournier's gangrene. Will give Vanc for cellulitis coverage as well as diflucan. Stool in perineal area is brown, not melena or hematochezia.  Labs show  normal WBC count, Hgb 13.5, PLT 135, sodium 117, chloride 79, bicarb 20, normal creatinine, AST 175, ALT 105, total bilirubin 4.1, anion gap 18.  These lab derangements likely reflect a combination of alcoholic hepatitis and dehydration from both alcohol use and vomiting/diarrhea.  Gave fluid bolus, IV Protonix, Zofran.  Placed on CIWA protocol; he does not have signs or symptoms of acute alcohol withdrawal here and blood alcohol level is 118.  Initial lactate is 4.1.  Given his anion gap and story of vomiting and diarrhea, I suspect this lactate reflects dehydration rather than sepsis.  Obtain CT of abdomen pelvis which shows some gastric wall thickening but otherwise no acute process.  He has remained alert and neurologically intact here.  [facility] Dr. Leafy HalfShalhoub. Pt will be transferred for admission. Final Clinical Impression(s) / ED Diagnoses Final diagnoses:  None    Rx / DC Orders ED Discharge Orders    None       Derell Bruun, Ambrose Finlandachel Morgan, MD 02/05/21 2308

## 2021-02-05 NOTE — ED Notes (Signed)
Per MD, clear liquids only.

## 2021-02-05 NOTE — ED Notes (Signed)
Pt ambulated to use bed side commode.

## 2021-02-05 NOTE — ED Triage Notes (Signed)
Pt c/o n/v/d x5-6 days with back pain and weakness. States unable to keep fluids or solids down. Pt states passing dark blood from rectum x3 days with diarrhea.

## 2021-02-05 NOTE — ED Triage Notes (Addendum)
Nausea, vomiting, diarrhea x 5 days, blood in stool, unable to keep down food or drink.  Also has a rash in his right groin

## 2021-02-05 NOTE — ED Notes (Signed)
MD Little made aware of elevated Lactic Acid; no new orders at this time. Acuity level will be changed according to Guidelines.

## 2021-02-06 ENCOUNTER — Encounter (HOSPITAL_COMMUNITY): Payer: Self-pay | Admitting: Internal Medicine

## 2021-02-06 DIAGNOSIS — G621 Alcoholic polyneuropathy: Secondary | ICD-10-CM | POA: Diagnosis present

## 2021-02-06 DIAGNOSIS — R32 Unspecified urinary incontinence: Secondary | ICD-10-CM | POA: Diagnosis present

## 2021-02-06 DIAGNOSIS — Z20822 Contact with and (suspected) exposure to covid-19: Secondary | ICD-10-CM | POA: Diagnosis present

## 2021-02-06 DIAGNOSIS — L03314 Cellulitis of groin: Secondary | ICD-10-CM | POA: Diagnosis present

## 2021-02-06 DIAGNOSIS — Y905 Blood alcohol level of 100-119 mg/100 ml: Secondary | ICD-10-CM | POA: Diagnosis present

## 2021-02-06 DIAGNOSIS — K7010 Alcoholic hepatitis without ascites: Secondary | ICD-10-CM | POA: Diagnosis present

## 2021-02-06 DIAGNOSIS — F17210 Nicotine dependence, cigarettes, uncomplicated: Secondary | ICD-10-CM | POA: Diagnosis present

## 2021-02-06 DIAGNOSIS — E860 Dehydration: Secondary | ICD-10-CM | POA: Diagnosis present

## 2021-02-06 DIAGNOSIS — F10230 Alcohol dependence with withdrawal, uncomplicated: Secondary | ICD-10-CM | POA: Diagnosis present

## 2021-02-06 DIAGNOSIS — R21 Rash and other nonspecific skin eruption: Secondary | ICD-10-CM | POA: Diagnosis present

## 2021-02-06 DIAGNOSIS — Z7141 Alcohol abuse counseling and surveillance of alcoholic: Secondary | ICD-10-CM | POA: Diagnosis not present

## 2021-02-06 DIAGNOSIS — R634 Abnormal weight loss: Secondary | ICD-10-CM | POA: Diagnosis present

## 2021-02-06 DIAGNOSIS — Z8616 Personal history of COVID-19: Secondary | ICD-10-CM | POA: Diagnosis not present

## 2021-02-06 DIAGNOSIS — F10239 Alcohol dependence with withdrawal, unspecified: Secondary | ICD-10-CM | POA: Diagnosis present

## 2021-02-06 DIAGNOSIS — E871 Hypo-osmolality and hyponatremia: Secondary | ICD-10-CM | POA: Diagnosis present

## 2021-02-06 DIAGNOSIS — E43 Unspecified severe protein-calorie malnutrition: Secondary | ICD-10-CM | POA: Diagnosis present

## 2021-02-06 DIAGNOSIS — I10 Essential (primary) hypertension: Secondary | ICD-10-CM | POA: Diagnosis present

## 2021-02-06 DIAGNOSIS — Z6820 Body mass index (BMI) 20.0-20.9, adult: Secondary | ICD-10-CM | POA: Diagnosis not present

## 2021-02-06 DIAGNOSIS — M1990 Unspecified osteoarthritis, unspecified site: Secondary | ICD-10-CM | POA: Diagnosis present

## 2021-02-06 DIAGNOSIS — Z28311 Partially vaccinated for covid-19: Secondary | ICD-10-CM | POA: Diagnosis not present

## 2021-02-06 DIAGNOSIS — F10939 Alcohol use, unspecified with withdrawal, unspecified: Secondary | ICD-10-CM | POA: Diagnosis present

## 2021-02-06 DIAGNOSIS — R627 Adult failure to thrive: Secondary | ICD-10-CM | POA: Diagnosis present

## 2021-02-06 DIAGNOSIS — R972 Elevated prostate specific antigen [PSA]: Secondary | ICD-10-CM | POA: Diagnosis present

## 2021-02-06 DIAGNOSIS — D696 Thrombocytopenia, unspecified: Secondary | ICD-10-CM | POA: Diagnosis present

## 2021-02-06 DIAGNOSIS — J302 Other seasonal allergic rhinitis: Secondary | ICD-10-CM | POA: Diagnosis present

## 2021-02-06 DIAGNOSIS — R159 Full incontinence of feces: Secondary | ICD-10-CM | POA: Diagnosis present

## 2021-02-06 DIAGNOSIS — R7303 Prediabetes: Secondary | ICD-10-CM | POA: Diagnosis present

## 2021-02-06 DIAGNOSIS — R112 Nausea with vomiting, unspecified: Secondary | ICD-10-CM | POA: Diagnosis present

## 2021-02-06 DIAGNOSIS — Z833 Family history of diabetes mellitus: Secondary | ICD-10-CM | POA: Diagnosis not present

## 2021-02-06 HISTORY — DX: Abnormal weight loss: R63.4

## 2021-02-06 LAB — CBC
HCT: 33.7 % — ABNORMAL LOW (ref 39.0–52.0)
Hemoglobin: 12.2 g/dL — ABNORMAL LOW (ref 13.0–17.0)
MCH: 31.3 pg (ref 26.0–34.0)
MCHC: 36.2 g/dL — ABNORMAL HIGH (ref 30.0–36.0)
MCV: 86.4 fL (ref 80.0–100.0)
Platelets: 122 10*3/uL — ABNORMAL LOW (ref 150–400)
RBC: 3.9 MIL/uL — ABNORMAL LOW (ref 4.22–5.81)
RDW: 13.1 % (ref 11.5–15.5)
WBC: 6.4 10*3/uL (ref 4.0–10.5)
nRBC: 0 % (ref 0.0–0.2)

## 2021-02-06 LAB — COMPREHENSIVE METABOLIC PANEL
ALT: 104 U/L — ABNORMAL HIGH (ref 0–44)
AST: 214 U/L — ABNORMAL HIGH (ref 15–41)
Albumin: 2.9 g/dL — ABNORMAL LOW (ref 3.5–5.0)
Alkaline Phosphatase: 327 U/L — ABNORMAL HIGH (ref 38–126)
Anion gap: 14 (ref 5–15)
BUN: 9 mg/dL (ref 6–20)
CO2: 19 mmol/L — ABNORMAL LOW (ref 22–32)
Calcium: 8.7 mg/dL — ABNORMAL LOW (ref 8.9–10.3)
Chloride: 87 mmol/L — ABNORMAL LOW (ref 98–111)
Creatinine, Ser: 1.07 mg/dL (ref 0.61–1.24)
GFR, Estimated: >60 mL/min (ref >60)
Glucose, Bld: 118 mg/dL — ABNORMAL HIGH (ref 70–99)
Potassium: 3.7 mmol/L (ref 3.5–5.1)
Sodium: 120 mmol/L — ABNORMAL LOW (ref 135–145)
Total Bilirubin: 3.8 mg/dL — ABNORMAL HIGH (ref 0.3–1.2)
Total Protein: 6.6 g/dL (ref 6.5–8.1)

## 2021-02-06 LAB — BASIC METABOLIC PANEL
Anion gap: 10 (ref 5–15)
BUN: 7 mg/dL (ref 6–20)
CO2: 24 mmol/L (ref 22–32)
Calcium: 8.8 mg/dL — ABNORMAL LOW (ref 8.9–10.3)
Chloride: 89 mmol/L — ABNORMAL LOW (ref 98–111)
Creatinine, Ser: 0.88 mg/dL (ref 0.61–1.24)
GFR, Estimated: >60 mL/min (ref >60)
Glucose, Bld: 116 mg/dL — ABNORMAL HIGH (ref 70–99)
Potassium: 3.8 mmol/L (ref 3.5–5.1)
Sodium: 123 mmol/L — ABNORMAL LOW (ref 135–145)

## 2021-02-06 LAB — LACTIC ACID, PLASMA: Lactic Acid, Venous: 1.5 mmol/L (ref 0.5–1.9)

## 2021-02-06 LAB — MAGNESIUM: Magnesium: 1.9 mg/dL (ref 1.7–2.4)

## 2021-02-06 LAB — HIV ANTIBODY (ROUTINE TESTING W REFLEX): HIV Screen 4th Generation wRfx: NONREACTIVE

## 2021-02-06 LAB — PHOSPHORUS: Phosphorus: 2 mg/dL — ABNORMAL LOW (ref 2.5–4.6)

## 2021-02-06 LAB — OSMOLALITY: Osmolality: 283 mOsm/kg (ref 275–295)

## 2021-02-06 LAB — MRSA PCR SCREENING: MRSA by PCR: NEGATIVE

## 2021-02-06 LAB — SODIUM, URINE, RANDOM: Sodium, Ur: <10 mmol/L

## 2021-02-06 MED ORDER — ONDANSETRON HCL 4 MG/2ML IJ SOLN: 4.0000 mg | Freq: Four times a day (QID) | INTRAMUSCULAR | Status: DC | PRN

## 2021-02-06 MED ORDER — LACTATED RINGERS IV SOLN: INTRAVENOUS | Status: DC

## 2021-02-06 MED ORDER — HYDRALAZINE HCL 20 MG/ML IJ SOLN: 5.0000 mg | INTRAMUSCULAR | Status: DC | PRN

## 2021-02-06 MED ORDER — GABAPENTIN 300 MG PO CAPS
300.0000 mg | ORAL_CAPSULE | Freq: Every day | ORAL | Status: DC
  Administered 2021-02-06 – 2021-02-09 (×4): 300 mg via ORAL
  Filled 2021-02-06 (×4): qty 1

## 2021-02-06 MED ORDER — LORAZEPAM 2 MG/ML IJ SOLN
0.0000 mg | INTRAMUSCULAR | Status: DC
  Administered 2021-02-06: 1 mg via INTRAVENOUS
  Administered 2021-02-07: 2 mg via INTRAVENOUS
  Administered 2021-02-07: 3 mg via INTRAVENOUS
  Administered 2021-02-07 (×3): 2 mg via INTRAVENOUS
  Administered 2021-02-07 – 2021-02-08 (×2): 1 mg via INTRAVENOUS
  Administered 2021-02-08 (×2): 2 mg via INTRAVENOUS
  Filled 2021-02-06 (×7): qty 1
  Filled 2021-02-06: qty 2
  Filled 2021-02-06 (×2): qty 1

## 2021-02-06 MED ORDER — LORAZEPAM 2 MG/ML IJ SOLN
1.0000 mg | INTRAMUSCULAR | Status: AC | PRN
  Administered 2021-02-07: 4 mg via INTRAVENOUS
  Administered 2021-02-07: 3 mg via INTRAVENOUS
  Administered 2021-02-08: 1 mg via INTRAVENOUS
  Administered 2021-02-09: 2 mg via INTRAVENOUS
  Filled 2021-02-06 (×2): qty 1
  Filled 2021-02-06: qty 2
  Filled 2021-02-06 (×3): qty 1

## 2021-02-06 MED ORDER — LORAZEPAM 2 MG/ML IJ SOLN: 0.0000 mg | Freq: Three times a day (TID) | INTRAMUSCULAR | Status: DC

## 2021-02-06 MED ORDER — THIAMINE HCL 100 MG/ML IJ SOLN
100.0000 mg | Freq: Every day | INTRAMUSCULAR | Status: DC
  Administered 2021-02-07: 100 mg via INTRAVENOUS
  Filled 2021-02-06 (×2): qty 2

## 2021-02-06 MED ORDER — HYDROCODONE-ACETAMINOPHEN 5-325 MG PO TABS
1.0000 | ORAL_TABLET | ORAL | Status: DC | PRN
  Administered 2021-02-09: 2 via ORAL
  Filled 2021-02-06: qty 2

## 2021-02-06 MED ORDER — MORPHINE SULFATE (PF) 2 MG/ML IV SOLN
2.0000 mg | INTRAVENOUS | Status: DC | PRN
Start: 2021-02-06 — End: 2021-02-09

## 2021-02-06 MED ORDER — THIAMINE HCL 100 MG PO TABS
100.0000 mg | ORAL_TABLET | Freq: Every day | ORAL | Status: DC
  Administered 2021-02-08 – 2021-02-10 (×3): 100 mg via ORAL
  Filled 2021-02-06 (×4): qty 1

## 2021-02-06 MED ORDER — FLUCONAZOLE IN SODIUM CHLORIDE 200-0.9 MG/100ML-% IV SOLN
200.0000 mg | INTRAVENOUS | Status: DC
  Administered 2021-02-06 – 2021-02-09 (×4): 200 mg via INTRAVENOUS
  Filled 2021-02-06 (×5): qty 100

## 2021-02-06 MED ORDER — ENOXAPARIN SODIUM 40 MG/0.4ML ~~LOC~~ SOLN
40.0000 mg | SUBCUTANEOUS | Status: DC
  Administered 2021-02-06 – 2021-02-09 (×4): 40 mg via SUBCUTANEOUS
  Filled 2021-02-06 (×4): qty 0.4

## 2021-02-06 MED ORDER — LORAZEPAM 1 MG PO TABS
1.0000 mg | ORAL_TABLET | ORAL | Status: AC | PRN
  Administered 2021-02-06 – 2021-02-08 (×3): 1 mg via ORAL
  Administered 2021-02-08: 2 mg via ORAL
  Filled 2021-02-06: qty 2
  Filled 2021-02-06 (×3): qty 1

## 2021-02-06 MED ORDER — SODIUM CHLORIDE 0.9% FLUSH
3.0000 mL | Freq: Two times a day (BID) | INTRAVENOUS | Status: DC
  Administered 2021-02-06 – 2021-02-10 (×9): 3 mL via INTRAVENOUS

## 2021-02-06 MED ORDER — FOLIC ACID 1 MG PO TABS
1.0000 mg | ORAL_TABLET | Freq: Every day | ORAL | Status: DC
  Administered 2021-02-06 – 2021-02-10 (×5): 1 mg via ORAL
  Filled 2021-02-06 (×5): qty 1

## 2021-02-06 MED ORDER — ACETAMINOPHEN 325 MG PO TABS: 650.0000 mg | ORAL_TABLET | Freq: Four times a day (QID) | ORAL | Status: DC | PRN

## 2021-02-06 MED ORDER — ONDANSETRON HCL 4 MG PO TABS
4.0000 mg | ORAL_TABLET | Freq: Four times a day (QID) | ORAL | Status: DC | PRN
  Filled 2021-02-06: qty 1

## 2021-02-06 MED ORDER — ADULT MULTIVITAMIN W/MINERALS CH
1.0000 | ORAL_TABLET | Freq: Every day | ORAL | Status: DC
  Administered 2021-02-06 – 2021-02-10 (×5): 1 via ORAL
  Filled 2021-02-06 (×5): qty 1

## 2021-02-06 MED ORDER — NICOTINE 21 MG/24HR TD PT24
21.0000 mg | MEDICATED_PATCH | Freq: Every day | TRANSDERMAL | Status: DC
  Administered 2021-02-06 – 2021-02-10 (×5): 21 mg via TRANSDERMAL
  Filled 2021-02-06 (×5): qty 1

## 2021-02-06 MED ORDER — ACETAMINOPHEN 650 MG RE SUPP: 650.0000 mg | Freq: Four times a day (QID) | RECTAL | Status: DC | PRN

## 2021-02-06 NOTE — ED Notes (Signed)
Visually checked on Pt. Pt stated that he was fine

## 2021-02-06 NOTE — ED Notes (Signed)
Report given to [facility]. 

## 2021-02-06 NOTE — H&P (Addendum)
History and Physical    Cameron Rogers UJW:[id number] DOB: 1966-10-03 DOA: 02/05/2021  PCP: [name], DO Consultants:  None Patient coming from: Home - lives with nephew; NOK: Alba Destine, sister, [phone]  Chief Complaint: N/V/D  HPI: Cameron Rogers is a 55 y.o. male with medical history significant of HTN and ETOH dependence presenting with N/V/D.  He went to work on Wednesday and felt kind of bad.  He didn't have any energy and they sent him home since he was unable to do his job.  They told him he had to get well or he couldn't keep working there.  He stayed home for a whole week and not getting attention.  He was unable to keep anything down, having severe fatigue and n/v/d.  His sister picked him up Thursday and took him to [facility] and they sent to him to [facility].  Last emesis was Tuesday; he was given nausea medication at [facility].  He hasn't had a colonoscopy ever.  He has been having epistaxis, bloody stools, hematuria over a period of time.  His sister has noticed that he has lost a lot of weight - 50 pounds in the last few months.  He has also had a rash on his face - given a cream without improvement.  He had knots on his eye.  He has a significant groin rash.  He used to have a night sweats but non since COVID.    ED Course: [facility] transfer to [facility], per Dr. Leafy Half:  55 year old male with a past medical history of alcohol abuse and hypertension presenting with a 5-day history of nausea vomiting and diarrhea. Upon evaluation at [facility] emergency department patient was found to have substantial hyponatremia of 117. Patient was given 1 L of normal saline.   Despite patient's 5-day history of nausea vomiting and inability tolerate oral intake patient is somehow continuing to drink alcohol. Patient states that his last drink was earlier today.   In addition to patient's intractable nausea and vomiting and substantial hyponatremia patient also seems to have  substantial suspected candidiasis of the left groin with associated significant skin breakdown and suspected superimposed bacterial infection. Dr. Clarene Duke does not think the patient has any evidence of Fournier's gangrene. Dr. Clarene Duke has placed patient on fluconazole and vancomycin for this.   Serum osmolality, urine osmolality and serum sodium all pending.   Excepting patient to progressive bed for continued close monitoring, frequent BMPs, management of impending alcohol withdrawal and treatment of suspected soft tissue infection of left groin.  Review of Systems: As per HPI; otherwise review of systems reviewed and negative.   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 Alcohol dependence (HCC)   . Allergy    SEASONAL  . Anemia   . Arthritis   . Heart murmur   . Hypertension   . Prediabetes   . Unexplained weight loss 02/06/2021    Past Surgical History:  Procedure Laterality Date  . NO PAST SURGERIES      Social History   Socioeconomic History  . Marital status: Single    Spouse name: Not on file  . Number of children: Not on file  . Years of education: Not on file  . Highest education level: Not on file  Occupational History  . Occupation: Oceanographer  Tobacco Use  . Smoking status: Current Every Day Smoker    Packs/day: 0.50  Years: 38.00    Pack years: 19.00    Types: Cigarettes  . Smokeless tobacco: Never Used  Vaping Use  . Vaping Use: Never used  Substance and Sexual Activity  . Alcohol use: Yes    Comment: 3- 40 ounce beers daily;  h/o heavy use  . Drug use: No  . Sexual activity: Yes  Other Topics Concern  . Not on file  Social History Narrative  . Not on file   Social Determinants of Health   Financial Resource Strain: Not on file  Food Insecurity: Not on file  Transportation Needs: Not on file  Physical Activity: Not on file  Stress: Not on  file  Social Connections: Not on file  Intimate Partner Violence: Not on file    No Known Allergies  Family History  Problem Relation Age of Onset  . Diabetes Mother   . Breast cancer Sister   . Diabetes Sister   . Heart murmur Sister   . Diabetes Brother   . Colon cancer Neg Hx   . Colon polyps Neg Hx   . Esophageal cancer Neg Hx   . Rectal cancer Neg Hx   . Stomach cancer Neg Hx   . Ovarian cancer Neg Hx     Prior to Admission medications   Medication Sig Start Date End Date Taking? Authorizing Provider  chlordiazePOXIDE (LIBRIUM) 25 MG capsule 50mg  PO TID x 1D, then 25-50mg  PO BID X 1D, then 25-50mg  PO QD X 1D 01/20/21   Khatri, Hina, PA-C  ferrous sulfate 325 (65 FE) MG tablet Take 325 mg by mouth daily with breakfast.    [provider]  gabapentin (NEURONTIN) 300 MG capsule Take 1 capsule (300 mg total) by mouth at bedtime. 06/06/19   Hoy RegisterNewlin, Enobong, MD  hydrocortisone (ANUSOL-HC) 2.5 % rectal cream Place 1 application rectally 2 (two) times daily. 10/12/20   Hall-Potvin, GrenadaBritt[place], PA-C  losartan-hydrochlorothiazide (HYZAAR) 50-12.5 MG tablet Take 1 tablet by mouth daily. 12/18/20   Hoy RegisterNewlin, Enobong, MD  Multiple Vitamin (MULTIVITAMIN) tablet Take 1 tablet by mouth daily. 10/02/20   Rema FendtStephens, Amy J, NP  triamcinolone ointment (KENALOG) 0.5 % Apply 1 application topically 2 (two) times daily. 12/18/20   Hoy RegisterNewlin, Enobong, MD  hydrochlorothiazide (HYDRODIURIL) 25 MG tablet Take 1 tablet (25 mg total) by mouth daily. 06/02/16 04/02/19  Anders SimmondsMcClung, Angela M, PA-C  omeprazole (PRILOSEC) 20 MG capsule Take 1 capsule (20 mg total) by mouth daily. 04/25/17 04/02/19  Benjiman CorePickering, Nathan, MD  promethazine (PHENERGAN) 25 MG tablet Take 1 tablet (25 mg total) by mouth every 8 (eight) hours as needed for nausea or vomiting. 04/25/17 04/02/19  Benjiman CorePickering, Nathan, MD    Physical Exam: Vitals:   02/06/21 0800 02/06/21 0856 02/06/21 1100 02/06/21 1200  BP: (!) 129/97  (!) 131/103 (!) 125/96  Pulse:  93  99 (!) 109  Resp: 17  20 17   Temp:  97.8 F (36.6 C) 98.4 F (36.9 C)   TempSrc:  Oral Oral   SpO2: 98%  96% 94%  Weight:      Height:         . General:  Appears calm and comfortable and is in NAD . Eyes:  EOMI, normal lids, iris . ENT:  grossly normal hearing, lips & tongue, mmm . Neck:  no LAD, masses or thyromegaly . Cardiovascular:  RRR, no m/r/g. No LE edema.  [name]. Respiratory:   CTA bilaterally with no wheezes/rales/rhonchi.  Normal respiratory effort. . Abdomen:  soft, NT, ND .  GU:   Scrotal erythema with diffuse and thick cottage cheese-like drainage . Skin:  Severe plantar lichenification, particularly over his heels; mild facial erythema and rash along a malar/beard distribution . Musculoskeletal:  grossly normal tone BUE/BLE, good ROM, no bony abnormality . Psychiatric: blunted mood and affect, speech fluent and appropriate, AOx3 with possible mild cognitive impairment . Neurologic:  CN 2-12 grossly intact, moves all extremities in coordinated fashion    Radiological Exams on Admission: Independently reviewed - see discussion in A/P where applicable  CT Abdomen Pelvis W Contrast  Result Date: 02/05/2021 CLINICAL DATA:  Diverticulitis suspected. Nausea, vomiting, diarrhea for 5-6 days. Back pain and weakness. EXAM: CT ABDOMEN AND PELVIS WITH CONTRAST TECHNIQUE: Multidetector CT imaging of the abdomen and pelvis was performed using the standard protocol following bolus administration of intravenous contrast. CONTRAST:  OMNIPAQUE IOHEXOL 300 MG/ML  SOLN COMPARISON:  Right upper quadrant ultrasound 02/26/2020 FINDINGS: Lower chest: Lung bases are clear. No acute airspace disease or pleural fluid. Hepatobiliary: Diffusely decreased hepatic density typical of steatosis. No focal hepatic abnormality. Gallbladder physiologically distended, no calcified stone. No biliary dilatation. Pancreas: No ductal dilatation or inflammation. Spleen: Normal in size without focal  abnormality. Adrenals/Urinary Tract: Normal adrenal glands. No hydronephrosis or perinephric edema. Homogeneous renal enhancement with symmetric excretion on delayed phase imaging. No visualized renal calculi or focal renal lesion. Urinary bladder is physiologically distended without wall thickening. Stomach/Bowel: Decompressed stomach. Possible gastric wall thickening not well assessed due to nondistention. Normal positioning of the duodenum and ligament of Treitz. There is no small bowel obstruction or inflammatory change. Normal appendix. Minimal diverticulosis primarily involving the right colon. No diverticulitis. Majority of the colon is decompressed which limits detailed assessment. No obvious colonic wall thickening. No pericolonic edema Vascular/Lymphatic: Mild aortic atherosclerosis. No aortic aneurysm. Patent portal and mesenteric veins. No enlarged lymph nodes in the abdomen or pelvis. Reproductive: Mildly enlarged prostate gland. Other: No free air, free fluid, or intra-abdominal fluid collection. Musculoskeletal: There are no acute or suspicious osseous abnormalities. Facet hypertrophy in the lower lumbar spine. IMPRESSION: 1. Minimal right colonic diverticulosis without diverticulitis. 2. Possible gastric wall thickening versus nondistention, suggesting gastritis or peptic ulcer disease. 3. Mild hepatic steatosis. 4. Mildly enlarged prostate gland. Aortic Atherosclerosis (ICD10-I70.0). Electronically Signed   By: Narda Rutherford M.D.   On: 02/05/2021 19:47    EKG: Independently reviewed.  Sinus tachycardia with rate 103; no evidence of acute ischemia   Labs on Admission: I have personally reviewed the available labs and imaging studies at the time of the admission.  Pertinent labs:   Na++ 117 -> 120; 132 on 4/6; 140 on 1/14 Anion gap 18 -> 15 AP 368 AST 175/ALT 105/Bili 4.1 Lactate 4.1 -> 1.5 WBC 8.8 Platelets 139, 122 Osm 283 COVID/flu negative UA: 100 glucose, trace Hgb Urine  sodium <10 ETOH 118 INR on 4/6 was 0.9   Assessment/Plan Principal Problem:   Hyponatremia Active Problems:   Essential hypertension   Elevated PSA   Elevated liver function tests   Prediabetes   Alcohol withdrawal (HCC)   Alcohol dependence with uncomplicated withdrawal (HCC)   Nausea, vomiting, and diarrhea   Groin rash   Unexplained weight loss    Hypovolemic hyponatremia -Etiology appears to be hypovolemic hyponatremia; patient with recent GI symptoms and reported 7-10 days of decreased PO intake.   -Likely also in conjunction with beer potomania. -Because the patient is generally asymptomatic, it is unlikely that this extent of hyponatremia developed overnight - although  he did have a prior normal sodium 2 months ago and only mild hyponatremia 2 weeks ago. -Sodium increased from 117 to 120 based on boluses/IVF received in ER.   -He should no longer be an increased risk for overcorrection (>12mEq/L/day so as to avoid central pontine myelinolysis), so will increase IVF infusion to 75 cc/hr and continue to monitor q4h BMP for now -ER physician checked serum Osm as well as urinary sodium level - plasma Osm is normal.  Urine sodium was quite low, which is consistent with dehydration. -Will admit to progressive, as he is likely to be here several days  N/V/D -He reports n/v/d and weakness which has improved -May have been gastroenteritis, gastritis, or other -Appears to be better now -If symptoms recur, could consider GI pathogen panel -He has a h/o rectal bleeding and has never had a colonoscopy -C-scope was scheduled with Dr. Myrtie Neither at 7/23 clinic visit but I do not see evidence of this having been performed  ETOH abuse with elevated LFTs -Elevated LFTs -Reports h/o heavy ETOH use, continuing to drink daily -CIWA protocol ordered -Will trend LFTs -Will need outpatient GI f/u unless new issues arise while hospitalized  Thrombocytopenia -Mild, likely associated with  decreased bone marrow production in the setting of ETOH overuse -He has had epistaxis and gross hematuria as well as the rectal bleeding as noted above - possibly related to this? -Suggest outpatient f/u if he does not have active bleeding during this hospitalization  Groin rash -Scrotal erythema without edema, significant think white discharge -Extensive rash, will give IV Dilfucan for now -Wound care consultation -No current concern for Fournier's  Elevated PSA -4.8 in 04/2019 -Will recheck today -Needs outpatient f/u  HTN -Previously on Norvasc -Currently only on Cozaar HCT; will hold for now given hyponatremia -Will cover with IV prn hydralazine  Prediabetes -A1c 5.9 in 04/2019 -Glucose is only 110 today -Will follow fasting glucose for now  Unexplained weight loss -He has last 13 kg since 02/2020 hospitalization -Needs ongoing evaluation -Nutrition consult -He may be substituting beer for meals    Note: This patient has been tested and is negative for the novel coronavirus COVID-19. The patient has been partially vaccinated against COVID-19.   Level of care: Progressive DVT prophylaxis:  Lovenox  Code Status:  Full - confirmed with patient/family Family Communication: Sister was present throughout evaluation Disposition Plan:  The patient is from: home  Anticipated d/c is to: home without [facility] services   Anticipated d/c date will depend on clinical response to treatment, likely 2-3 days  Patient is currently: acutely ill Consults called: Nutrition; wound care; TOC team; PT/OT Admission status:  Admit - It is my clinical opinion that admission to INPATIENT is reasonable and necessary because of the expectation that this patient will require hospital care that crosses at least 2 midnights to treat this condition based on the medical complexity of the problems presented.  Given the aforementioned information, the predictability of an adverse outcome is felt to be  significant.    Jonah Blue MD [facility]   How to contact the [facility] Attending or Consulting provider 7A - 7P or covering provider during after hours 7P -7A, for this patient?  1. Check the care team in [facility] and look for a) attending/consulting TR[facility] provider listed and b) the [facility] team listed 2. Log into [email or web address] and use [place]'s universal password to access. If you do not have the password, please contact the hospital operator. 3. Locate the [facility]  provider you are looking for under [facility] and page to a number that you can be directly reached. 4. If you still have difficulty reaching the provider, please page the [facility] (Director on Call) for the Hospitalists listed on amion for assistance.   02/06/2021, 3:27 PM

## 2021-02-06 NOTE — ED Notes (Signed)
Pt given more clear liquid.

## 2021-02-06 NOTE — Plan of Care (Signed)
  Problem: Education: Goal: Knowledge of General Education information will improve Description: Including pain rating scale, medication(s)/side effects and non-pharmacologic comfort measures Outcome: Progressing   Problem: Clinical Measurements: Goal: Ability to maintain clinical measurements within normal limits will improve Outcome: Progressing   Problem: Clinical Measurements: Goal: Will remain free from infection Outcome: Progressing   Problem: Activity: Goal: Risk for activity intolerance will decrease Outcome: Progressing   Problem: Nutrition: Goal: Adequate nutrition will be maintained Outcome: Progressing   Problem: Elimination: Goal: Will not experience complications related to bowel motility Outcome: Progressing   Problem: Pain Managment: Goal: General experience of comfort will improve Outcome: Progressing   Problem: Safety: Goal: Ability to remain free from injury will improve Outcome: Progressing

## 2021-02-06 NOTE — ED Notes (Signed)
Patient given clear liquid chicken broth and sprite.

## 2021-02-06 NOTE — Progress Notes (Signed)
Nurse tech informed RN that patient was caught smoking a cigarette in the bathroom. Pt told nurse tech that he "needed to get it off". Nurse tech informed this RN that prior to this, pt had stated that he wanted to go outside and smoke. Nurse tech informed patient that he was not allowed to smoke and that he had a nicotine patch on.  This RN went into patient's room and confiscated cigarettes and lighter. Patient label placed on both and placed in patient's chart. Will continue to monitor.

## 2021-02-07 DIAGNOSIS — E871 Hypo-osmolality and hyponatremia: Secondary | ICD-10-CM | POA: Diagnosis not present

## 2021-02-07 LAB — BASIC METABOLIC PANEL
Anion gap: 13 (ref 5–15)
Anion gap: 10 (ref 5–15)
Anion gap: 9 (ref 5–15)
BUN: <5 mg/dL — ABNORMAL LOW (ref 6–20)
BUN: <5 mg/dL — ABNORMAL LOW (ref 6–20)
BUN: <5 mg/dL — ABNORMAL LOW (ref 6–20)
CO2: 23 mmol/L (ref 22–32)
CO2: 23 mmol/L (ref 22–32)
CO2: 29 mmol/L (ref 22–32)
Calcium: 8.9 mg/dL (ref 8.9–10.3)
Calcium: 9.2 mg/dL (ref 8.9–10.3)
Calcium: 9.3 mg/dL (ref 8.9–10.3)
Chloride: 86 mmol/L — ABNORMAL LOW (ref 98–111)
Chloride: 88 mmol/L — ABNORMAL LOW (ref 98–111)
Chloride: 90 mmol/L — ABNORMAL LOW (ref 98–111)
Creatinine, Ser: 0.87 mg/dL (ref 0.61–1.24)
Creatinine, Ser: 0.84 mg/dL (ref 0.61–1.24)
Creatinine, Ser: 0.79 mg/dL (ref 0.61–1.24)
GFR, Estimated: >60 mL/min (ref >60)
GFR, Estimated: >60 mL/min (ref >60)
GFR, Estimated: >60 mL/min (ref >60)
Glucose, Bld: 133 mg/dL — ABNORMAL HIGH (ref 70–99)
Glucose, Bld: 116 mg/dL — ABNORMAL HIGH (ref 70–99)
Glucose, Bld: 100 mg/dL — ABNORMAL HIGH (ref 70–99)
Potassium: 3.5 mmol/L (ref 3.5–5.1)
Potassium: 3.7 mmol/L (ref 3.5–5.1)
Potassium: 3.5 mmol/L (ref 3.5–5.1)
Sodium: 122 mmol/L — ABNORMAL LOW (ref 135–145)
Sodium: 121 mmol/L — ABNORMAL LOW (ref 135–145)
Sodium: 128 mmol/L — ABNORMAL LOW (ref 135–145)

## 2021-02-07 LAB — CBC
HCT: 31.9 % — ABNORMAL LOW (ref 39.0–52.0)
Hemoglobin: 11.5 g/dL — ABNORMAL LOW (ref 13.0–17.0)
MCH: 31.3 pg (ref 26.0–34.0)
MCHC: 36.1 g/dL — ABNORMAL HIGH (ref 30.0–36.0)
MCV: 86.9 fL (ref 80.0–100.0)
Platelets: 124 10*3/uL — ABNORMAL LOW (ref 150–400)
RBC: 3.67 MIL/uL — ABNORMAL LOW (ref 4.22–5.81)
RDW: 13.2 % (ref 11.5–15.5)
WBC: 5.6 10*3/uL (ref 4.0–10.5)
nRBC: 0 % (ref 0.0–0.2)

## 2021-02-07 LAB — AMMONIA: Ammonia: 37 umol/L — ABNORMAL HIGH (ref 9–35)

## 2021-02-07 NOTE — ED Provider Notes (Addendum)
[facility]    CSN: [id number] Arrival date & time: 02/05/21  1617      History   Chief Complaint Chief Complaint  Patient presents with  . Emesis    HPI Cameron Rogers is a 55 y.o. male.   55 year old male with a complicated past medical history significant for alcohol dependence with subsequent liver disease, history of gastritis and acute GI bleed, anemia, hypertension presenting today with about a week of severe upper back pain, weakness, intolerance to p.o., nausea vomiting and diarrhea.  Has been passing dark tarry stools for the past 3 days.  Denies chest pain, shortness of breath, dizziness, syncope, fever, urinary symptoms.  So far has not been taking anything for his current symptoms.     Past Medical History:  Diagnosis Date  . Alcohol dependence (HCC)   . Allergy    SEASONAL  . Anemia   . Arthritis   . Heart murmur   . Hypertension   . Prediabetes   . Unexplained weight loss 02/06/2021    Patient Active Problem List   Diagnosis Date Noted  . Alcohol withdrawal (HCC) 02/06/2021  . Alcohol dependence with uncomplicated withdrawal (HCC) 02/06/2021  . Nausea, vomiting, and diarrhea 02/06/2021  . Groin rash 02/06/2021  . Unexplained weight loss 02/06/2021  . Hyponatremia 02/05/2021  . Acute upper GI bleed 02/26/2020  . COVID-19 virus infection 02/26/2020  . Essential hypertension 11/28/2019  . Neuropathy, alcoholic (HCC) 11/28/2019  . Elevated PSA 11/28/2019  . Elevated liver function tests 11/28/2019  . Prediabetes 11/28/2019  . Alcohol abuse 11/28/2019    Past Surgical History:  Procedure Laterality Date  . NO PAST SURGERIES         Home Medications    Prior to Admission medications   Medication Sig Start Date End Date Taking? Authorizing Provider  gabapentin (NEURONTIN) 300 MG capsule Take 1 capsule (300 mg total) by mouth at bedtime. 06/06/19   [name], MD  losartan-hydrochlorothiazide (HYZAAR) 50-12.5 MG tablet Take 1  tablet by mouth daily. 12/18/20   [name], MD  hydrochlorothiazide (HYDRODIURIL) 25 MG tablet Take 1 tablet (25 mg total) by mouth daily. 06/02/16 04/02/19  [name], PA-C  omeprazole (PRILOSEC) 20 MG capsule Take 1 capsule (20 mg total) by mouth daily. 04/25/17 04/02/19  [name], MD  promethazine (PHENERGAN) 25 MG tablet Take 1 tablet (25 mg total) by mouth every 8 (eight) hours as needed for nausea or vomiting. 04/25/17 04/02/19  [name], MD    Family History Family History  Problem Relation Age of Onset  . Diabetes Mother   . Breast cancer Sister   . Diabetes Sister   . Heart murmur Sister   . Diabetes Brother   . Colon cancer Neg Hx   . Colon polyps Neg Hx   . Esophageal cancer Neg Hx   . Rectal cancer Neg Hx   . Stomach cancer Neg Hx   . Ovarian cancer Neg Hx     Social History Social History   Tobacco Use  . Smoking status: Current Every Day Smoker    Packs/day: 0.50    Years: 38.00    Pack years: 19.00    Types: Cigarettes  . Smokeless tobacco: Never Used  Vaping Use  . Vaping Use: Never used  Substance Use Topics  . Alcohol use: Yes    Comment: 3- 40 ounce beers daily;  h/o heavy use  . Drug use: No     Allergies   Patient  has no known allergies.   Review of Systems Review of Systems Per HPI Physical Exam Triage Vital Signs ED Triage Vitals  Enc Vitals Group     BP 02/05/21 1627 123/78     Pulse Rate 02/05/21 1627 100     Resp 02/05/21 1627 18     Temp 02/05/21 1627 98.4 F (36.9 C)     Temp Source 02/05/21 1627 Oral     SpO2 02/05/21 1627 97 %     Weight 02/05/21 1632 161 lb 3.2 oz (73.1 kg)     Height --      Head Circumference --      Peak Flow --      Pain Score 02/05/21 1628 0     Pain Loc --      Pain Edu? --      Excl. in GC? --    No data found.  Updated Vital Signs BP 123/78 (BP Location: Left Arm)   Pulse 100   Temp 98.4 F (36.9 C) (Oral)   Resp 18   Wt 161 lb 3.2 oz (73.1 kg)   SpO2 97%    BMI 20.70 kg/m   Visual Acuity Right Eye Distance:   Left Eye Distance:   Bilateral Distance:    Right Eye Near:   Left Eye Near:    Bilateral Near:     Physical Exam Vitals and nursing note reviewed.  Constitutional:      Appearance: Normal appearance.  HENT:     Head: Atraumatic.     Nose: Nose normal.     Mouth/Throat:     Mouth: Mucous membranes are moist.  Eyes:     Extraocular Movements: Extraocular movements intact.     Conjunctiva/sclera: Conjunctivae normal.  Cardiovascular:     Rate and Rhythm: Normal rate and regular rhythm.  Pulmonary:     Effort: Pulmonary effort is normal. No respiratory distress.     Breath sounds: Normal breath sounds. No wheezing or rales.  Abdominal:     General: There is no distension.     Tenderness: There is abdominal tenderness. There is right CVA tenderness and left CVA tenderness. There is no guarding.     Comments: Bowel sounds hyperactive throughout Epigastric tenderness palpation Bilateral flank tender to palpation  Musculoskeletal:        General: Normal range of motion.     Cervical back: Normal range of motion and neck supple.  Skin:    General: Skin is warm and dry.  Neurological:     General: No focal deficit present.     Mental Status: He is oriented to person, place, and time.     Cranial Nerves: No cranial nerve deficit.     Gait: Gait normal.  Psychiatric:        Mood and Affect: Mood normal.        Thought Content: Thought content normal.        Judgment: Judgment normal.      UC Treatments / Results  Labs (all labs ordered are listed, but only abnormal results are displayed) Labs Reviewed  POCT URINALYSIS DIP (MANUAL ENTRY) - Abnormal; Notable for the following components:      Result Value   Color, UA orange (*)    Glucose, UA =250 (*)    Bilirubin, UA small (*)    Spec Grav, UA <=1.005 (*)    Blood, UA moderate (*)    Protein Ur, POC trace (*)    Urobilinogen, UA 2.0 (*)  Nitrite, UA Positive  (*)    All other components within normal limits    EKG   Radiology CT Abdomen Pelvis W Contrast  Result Date: 02/05/2021 CLINICAL DATA:  Diverticulitis suspected. Nausea, vomiting, diarrhea for 5-6 days. Back pain and weakness. EXAM: CT ABDOMEN AND PELVIS WITH CONTRAST TECHNIQUE: Multidetector CT imaging of the abdomen and pelvis was performed using the standard protocol following bolus administration of intravenous contrast. CONTRAST:  OMNIPAQUE IOHEXOL 300 MG/ML  SOLN COMPARISON:  Right upper quadrant ultrasound 02/26/2020 FINDINGS: Lower chest: Lung bases are clear. No acute airspace disease or pleural fluid. Hepatobiliary: Diffusely decreased hepatic density typical of steatosis. No focal hepatic abnormality. Gallbladder physiologically distended, no calcified stone. No biliary dilatation. Pancreas: No ductal dilatation or inflammation. Spleen: Normal in size without focal abnormality. Adrenals/Urinary Tract: Normal adrenal glands. No hydronephrosis or perinephric edema. Homogeneous renal enhancement with symmetric excretion on delayed phase imaging. No visualized renal calculi or focal renal lesion. Urinary bladder is physiologically distended without wall thickening. Stomach/Bowel: Decompressed stomach. Possible gastric wall thickening not well assessed due to nondistention. Normal positioning of the duodenum and ligament of Treitz. There is no small bowel obstruction or inflammatory change. Normal appendix. Minimal diverticulosis primarily involving the right colon. No diverticulitis. Majority of the colon is decompressed which limits detailed assessment. No obvious colonic wall thickening. No pericolonic edema Vascular/Lymphatic: Mild aortic atherosclerosis. No aortic aneurysm. Patent portal and mesenteric veins. No enlarged lymph nodes in the abdomen or pelvis. Reproductive: Mildly enlarged prostate gland. Other: No free air, free fluid, or intra-abdominal fluid collection. Musculoskeletal:  There are no acute or suspicious osseous abnormalities. Facet hypertrophy in the lower lumbar spine. IMPRESSION: 1. Minimal right colonic diverticulosis without diverticulitis. 2. Possible gastric wall thickening versus nondistention, suggesting gastritis or peptic ulcer disease. 3. Mild hepatic steatosis. 4. Mildly enlarged prostate gland. Aortic Atherosclerosis (ICD10-I70.0). Electronically Signed   By: Narda Rutherford M.D.   On: 02/05/2021 19:47    Procedures Procedures (including critical care time)  Medications Ordered in UC Medications - No data to display  Initial Impression / Assessment and Plan / UC Course  I have reviewed the triage vital signs and the nursing notes.  Pertinent labs & imaging results that were available during my care of the patient were reviewed by me and considered in my medical decision making (see chart for details).     Overall his vital signs are stable today, in no acute distress but does appear mildly lethargic.  Abdominal exam significant for epigastric and flank tenderness to palpation, UA showing signs of dehydration and bilirubin.  Given his chronic medical problems and duration, severity of symptoms today recommend a higher level of evaluation in the ED to rule out life-threatening illnesses including acute GI blood loss.  Patient is reluctant but his support person with him who I  believe is his sister, was able to get him to be agreeable to go.  She will drive him via private vehicle, they declined EMS transport today.  He is hemodynamically stable for transport today.  Final Clinical Impressions(s) / UC Diagnoses   Final diagnoses:  Nausea vomiting and diarrhea  Black tarry stools  Abdominal pain, right upper quadrant  History of GI bleed  Alcohol dependence with unspecified alcohol-induced disorder [facility])     Discharge Instructions     [facility] [address] [place], [place] [address]     ED Prescriptions    None      PDMP not reviewed this encounter.  Particia NearingLane, Erionna Strum [place], [place]PA-C 02/07/21 0847    Particia NearingLane, Madalen Gavin [place], PA-C 02/07/21 [phone]

## 2021-02-07 NOTE — Progress Notes (Signed)
   02/07/21 1300  Vital Signs  Temp Source Oral  Pulse Rate 77  Pulse Rate Source Monitor  ECG Heart Rate 80  Resp 15  BP (!) 133/99  BP Location Right Arm  BP Method Automatic  Patient Position (if appropriate) Sitting  Oxygen Therapy  SpO2 98 %  O2 Device Room Air  MEWS Score  MEWS Temp 0  MEWS Systolic 0  MEWS Pulse 0  MEWS RR 0  MEWS LOC 0  MEWS Score 0  MEWS Score Color Green

## 2021-02-07 NOTE — Evaluation (Signed)
Physical Therapy Evaluation Patient Details Name: Cameron Rogers MRN: [id number] DOB: July 14, 1966 Today's Date: 02/07/2021   History of Present Illness  Pt is a 55 y.o. male admitted 02/05/21 with nausea, vomiting, diarrhea, but still able to consume alcohol; workup for hypovolemic hyponatremia, suspected candidiasis of L groin. PMH includes ETOH use, HTN, anemia, arthritis, COVID-19 (02/2020).    Clinical Impression  Pt presents with an overall decrease in functional mobility secondary to above. PTA, pt lives with nephew, independent with mobility; nephew assists with transportation/running errands; sister present and reports pt will have support available from multiple family members at d/c. Today, pt requiring min-modA to maintain balance with short ambulation distance. Pt limited by tremulous extremities, impaired balance, and decreased cognition, including poor safety awareness and attention; pt not consistently following instructions for safety, which limited today's session. Hopeful pt will progress well with mobility oncementation improves and be able to return home without need for follow-up PT services. Will follow acutely to address established goals.     Follow Up Recommendations No PT follow up;Supervision for mobility/OOB (pending progress)    Equipment Recommendations   (TBD)    Recommendations for Other Services       Precautions / Restrictions Precautions Precautions: Fall;Other (comment) Precaution Comments: CIWA; urinary incontinence Restrictions Weight Bearing Restrictions: No      Mobility  Bed Mobility Overal bed mobility: Needs Assistance Bed Mobility: Supine to Sit;Sit to Supine     Supine to sit: Supervision Sit to supine: Supervision   General bed mobility comments: Supervision for safety, pt restless, easily distracted by lines, requiring frequent cues to redirect to task    Transfers Overall transfer level: Needs assistance Equipment used:  None Transfers: Sit to/from Stand Sit to Stand: Min assist         General transfer comment: Increased time and effort, tremulous with decreased stability, minA to assist trnk elevation and maintain balance upon standing; multiple sit<>stands with posterior LOB and return to sitting EOB, able to maintain upright with minA  Ambulation/Gait Ambulation/Gait assistance: Min assist;Mod assist Gait Distance (Feet): 12 Feet Assistive device: None Gait Pattern/deviations: Step-to pattern;Shuffle;Staggering left;Staggering right;Trunk flexed Gait velocity: Decreased   General Gait Details: Dlow, tremulous, unsteady gait without DME, pt requiring frequent min-modA to prevent LOB; pt attempting to reach outside of BOS to furniture despite cues for safety. Deferred further ambulation distance due to safety concerns with pt not following directions, poor safety awareness and stability  Stairs            Wheelchair Mobility    Modified Rankin (Stroke Patients Only)       Balance Overall balance assessment: Needs assistance   Sitting balance-Leahy Scale: Fair       Standing balance-Leahy Scale: Poor                               Pertinent Vitals/Pain Pain Assessment: Faces Faces Pain Scale: Hurts a little bit Pain Location: head Pain Descriptors / Indicators: Headache Pain Intervention(s): Monitored during session    Home Living Family/patient expects to be discharged to:: Private residence Living Arrangements: Other relatives (nephew) Available Help at Discharge: Family;Available 24 hours/day Type of Home: Apartment Home Access: Level entry     Home Layout: One level Home Equipment: None Additional Comments: Sister present at end of session and able to verify information; pt's nephew lives with him; sister reports multiple other family members available to assist    Prior  Function Level of Independence: Independent         Comments: Independent with  ambulation and ADLs. Pt reports rarely drives; nephew assists with transportations and errands (i.e. getting groceries)     Hand Dominance        Extremity/Trunk Assessment   Upper Extremity Assessment Upper Extremity Assessment: Generalized weakness (tremulous)    Lower Extremity Assessment Lower Extremity Assessment: Generalized weakness (tremulous)    Cervical / Trunk Assessment Cervical / Trunk Assessment: Normal  Communication   Communication: No difficulties  Cognition Arousal/Alertness: Awake/alert Behavior During Therapy: Restless Overall Cognitive Status: Impaired/Different from baseline Area of Impairment: Orientation;Attention;Memory;Following commands;Safety/judgement;Awareness;Problem solving                 Orientation Level: Disoriented to;Place;Time;Situation Current Attention Level: Focused Memory: Decreased short-term memory Following Commands: Follows one step commands inconsistently Safety/Judgement: Decreased awareness of safety;Decreased awareness of deficits Awareness: Intellectual Problem Solving: Requires verbal cues;Difficulty sequencing General Comments: Pt restless, poor safety awareness and not consistently listening to directions; suspect aspect of ETOH withdrawal. Pt did recognize sister when she walked in, but not stating hername      General Comments General comments (skin integrity, edema, etc.): Pt's sister present and end of session, able to verify pt's PLOF and states family will has necessary assist from family upon d/c    Exercises     Assessment/Plan    PT Assessment Patient needs continued PT services  PT Problem List Decreased strength;Decreased activity tolerance;Decreased balance;Decreased mobility;Decreased cognition;Decreased safety awareness;Decreased knowledge of use of DME;Decreased knowledge of precautions       PT Treatment Interventions DME instruction;Gait training;Stair training;Functional mobility  training;Therapeutic activities;Therapeutic exercise;Balance training;Patient/family education;Cognitive remediation    PT Goals (Current goals can be found in the Care Plan section)  Acute Rehab PT Goals Patient Stated Goal: "Find my cigarettes" PT Goal Formulation: With patient Time For Goal Achievement: 02/21/21 Potential to Achieve Goals: Fair    Frequency Min 3X/week   Barriers to discharge        Co-evaluation               AM-PAC PT "6 Clicks" Mobility  Outcome Measure Help needed turning from your back to your side while in a flat bed without using bedrails?: A Little Help needed moving from lying on your back to sitting on the side of a flat bed without using bedrails?: A Little Help needed moving to and from a bed to a chair (including a wheelchair)?: A Little Help needed standing up from a chair using your arms (e.g., wheelchair or bedside chair)?: A Little Help needed to walk in hospital room?: A Lot Help needed climbing 3-5 steps with a railing? : A Lot 6 Click Score: 16    End of Session Equipment Utilized During Treatment: Gait belt Activity Tolerance: Other (comment) (pt limited by cognition/CIWA) Patient left: in bed;with call bell/phone within reach;with bed alarm set;with family/visitor present Nurse Communication: Mobility status PT Visit Diagnosis: Other abnormalities of gait and mobility (R26.89);Unsteadiness on feet (R26.81)    Time: 7858-8502 PT Time Calculation (min) (ACUTE ONLY): 21 min   Charges:   PT Evaluation $PT Eval Moderate Complexity: 1 Mod     Ina Homes, PT, DPT Acute Rehabilitation Services  Pager [phone] Office [phone]  Malachy Chamber 02/07/2021, 12:12 PM

## 2021-02-07 NOTE — Social Work (Signed)
CSW attempted to acknowledge SA consult however pt is not oriented at this time.

## 2021-02-07 NOTE — Progress Notes (Signed)
Progress Note    Cameron Rogers  IDP:[id number] DOB: 05-04-1966  DOA: 02/05/2021 PCP: [name], DO    Brief Narrative:    Medical records reviewed and are as summarized below:  Cameron Rogers is an 55 y.o. male with medical history significant of HTN and ETOH dependence presenting with N/V/D.  He went to work on Wednesday and felt kind of bad.  He didn't have any energy and they sent him home since he was unable to do his job.  They told him he had to get well or he couldn't keep working there.  He stayed home for a whole week and not getting attention.  He was unable to keep anything down, having severe fatigue and n/v/d.  His sister picked him up Thursday and took him to [facility] and they sent to him to [facility].  Last emesis was Tuesday; he was given nausea medication at [facility].  He hasn't had a colonoscopy ever.  He has been having epistaxis, bloody stools, hematuria over a period of time.  His sister has noticed that he has lost a lot of weight - 50 pounds in the last few months.  He has also had a rash on his face - given a cream without improvement.   Assessment/Plan:   Principal Problem:   Hyponatremia Active Problems:   Essential hypertension   Elevated PSA   Elevated liver function tests   Prediabetes   Alcohol withdrawal (HCC)   Alcohol dependence with uncomplicated withdrawal (HCC)   Nausea, vomiting, and diarrhea   Groin rash   Unexplained weight loss   Hypovolemic hyponatremia -Etiology appears to be hypovolemic hyponatremia; patient with recent GI symptoms and reported 7-10 days of decreased PO intake.   -Likely also in conjunction with beer potomania. -Because the patient is generally asymptomatic, it is unlikely that this extent of hyponatremia developed overnight - although he did have a prior normal sodium 2 months ago and only mild hyponatremia 2 weeks ago. -He should no longer be an increased risk for overcorrection   N/V/D -check GI pathogen panel  and c diff  ETOH abuse with elevated LFTs -Elevated LFTs -Reports h/o heavy ETOH use, continuing to drink daily -CIWA protocol ordered  Thrombocytopenia -Mild, likely associated with decreased bone marrow production in the setting of ETOH overuse -He has had epistaxis and gross hematuria as well as the rectal bleeding as noted above - possibly related to this? -stable  Groin rash -Scrotal erythema without edema, significant thick white discharge -Extensive rash, will give IV Dilfucan for now -Wound care consultation -patient is incontinent of both stool and urine  Elevated PSA -4.8 in 04/2019 -repeat pending -Needs outpatient f/u  HTN -Previously on Norvasc -Currently only on Cozaar HCT; will hold for now given hyponatremia -Will cover with IV prn hydralazine  Prediabetes -A1c 5.9 in 04/2019 -Will follow fasting glucose for now  Unexplained weight loss -He has last 13 kg since 02/2020 hospitalization -Needs ongoing evaluation -Nutrition consult   Family Communication/Anticipated D/C date and plan/Code Status   DVT prophylaxis: scd Code Status: Full Code.  Disposition Plan: Status is: Inpatient  Remains inpatient appropriate because:Inpatient level of care appropriate due to severity of illness   Dispo: The patient is from: Home              Anticipated d/c is to: Home              Patient currently is not medically stable to d/c.   Difficult  to place patient No         Medical Consultants:    None.     Subjective:   confused  Objective:    Vitals:   02/07/21 0421 02/07/21 0900 02/07/21 1000 02/07/21 1200  BP: (!) 130/103 (!) 104/92 (!) 104/92 (!) 133/112  Pulse: 88  88 86  Resp: 20 (!) 23 (!) 28 (!) 22  Temp: 98.4 F (36.9 C)   98.2 F (36.8 C)  TempSrc: Oral   Oral  SpO2: 98% 98%  98%  Weight:      Height:        Intake/Output Summary (Last 24 hours) at 02/07/2021 1311 Last data filed at 02/07/2021 1200 Gross per 24 hour   Intake 2736.14 ml  Output 1180 ml  Net 1556.14 ml   Filed Weights   02/05/21 1751  Weight: 73.6 kg     General: Appearance:    Ill appearing male in no acute distress   Skin with breakdown on scrotum  Lungs:     respirations unlabored  Heart:    Normal heart rate. Normal rhythm. No murmurs, rubs, or gallops.   MS:   All extremities are intact.   Neurologic:   Awake, pleasantly confused    Data Reviewed:   I have personally reviewed following labs and imaging studies:  Labs: Labs show the following:   Basic Metabolic Panel: Recent Labs  Lab 02/06/21 1409 02/06/21 2047 02/07/21 0016 02/07/21 0426 02/07/21 0953  NA 120* 123* 122* 121* 128*  K 3.7 3.8 3.5 3.7 3.5  CL 87* 89* 86* 88* 90*  CO2 19* 24 23 23 29   GLUCOSE 118* 116* 133* 116* 100*  BUN 9 7 <5* <5* <5*  CREATININE 1.07 0.88 0.87 0.84 0.79  CALCIUM 8.7* 8.8* 8.9 9.2 9.3  MG 1.9  --   --   --   --   PHOS 2.0*  --   --   --   --    GFR Estimated Creatinine Clearance: 109.9 mL/min (by C-G formula based on SCr of 0.79 mg/dL). Liver Function Tests: Recent Labs  Lab 02/05/21 1800 02/06/21 1409  AST 175* 214*  ALT 105* 104*  ALKPHOS 368* 327*  BILITOT 4.1* 3.8*  PROT 7.3 6.6  ALBUMIN 3.8 2.9*   Recent Labs  Lab 02/05/21 1800  LIPASE 58*   Recent Labs  Lab 02/07/21 0953  AMMONIA 37*   Coagulation profile No results for input(s): INR, PROTIME in the last 168 hours.  CBC: Recent Labs  Lab 02/05/21 1800 02/06/21 1409 02/07/21 0426  WBC 8.8 6.4 5.6  NEUTROABS 6.0  --   --   HGB 13.5 12.2* 11.5*  HCT 35.9* 33.7* 31.9*  MCV 83.5 86.4 86.9  PLT 139* 122* 124*   Cardiac Enzymes: No results for input(s): CKTOTAL, CKMB, CKMBINDEX, TROPONINI in the last 168 hours. BNP (last 3 results) No results for input(s): PROBNP in the last 8760 hours. CBG: Recent Labs  Lab 02/05/21 1753  GLUCAP 94   D-Dimer: No results for input(s): DDIMER in the last 72 hours. Hgb A1c: No results for input(s):  HGBA1C in the last 72 hours. Lipid Profile: No results for input(s): CHOL, HDL, LDLCALC, TRIG, CHOLHDL, LDLDIRECT in the last 72 hours. Thyroid function studies: No results for input(s): TSH, T4TOTAL, T3FREE, THYROIDAB in the last 72 hours.  Invalid input(s): FREET3 Anemia work up: No results for input(s): VITAMINB12, FOLATE, FERRITIN, TIBC, IRON, RETICCTPCT in the last 72 hours. Sepsis Labs: Recent Labs  Lab 02/05/21 1800 02/05/21 1823 02/06/21 0415 02/06/21 1409 02/07/21 0426  WBC 8.8  --   --  6.4 5.6  LATICACIDVEN  --  4.1* 1.5  --   --     Microbiology Recent Results (from the past 240 hour(s))  Resp Panel by RT-PCR (Flu A&B, Covid) Nasopharyngeal Swab     Status: None   Collection Time: 02/05/21  7:01 PM   Specimen: Nasopharyngeal Swab; Nasopharyngeal(NP) swabs in vial transport medium  Result Value Ref Range Status   SARS Coronavirus 2 by RT PCR NEGATIVE NEGATIVE Final    Comment: (NOTE) SARS-CoV-2 target nucleic acids are NOT DETECTED.  The SARS-CoV-2 RNA is generally detectable in upper respiratory specimens during the acute phase of infection. The lowest concentration of SARS-CoV-2 viral copies this assay can detect is 138 copies/mL. A negative result does not preclude SARS-Cov-2 infection and should not be used as the sole basis for treatment or other patient management decisions. A negative result may occur with  improper specimen collection/handling, submission of specimen other than nasopharyngeal swab, presence of viral mutation(s) within the areas targeted by this assay, and inadequate number of viral copies(<138 copies/mL). A negative result must be combined with clinical observations, patient history, and epidemiological information. The expected result is Negative.  Fact Sheet for Patients:  BloggerCourse.com  Fact Sheet for Healthcare Providers:  SeriousBroker.it  This test is no t yet approved or  cleared by the [place] FDA and  has been authorized for detection and/or diagnosis of SARS-CoV-2 by FDA under an Emergency Use Authorization (EUA). This EUA will remain  in effect (meaning this test can be used) for the duration of the COVID-19 declaration under Section 564(b)(1) of the Act, 21 U.S.C.section 360bbb-3(b)(1), unless the authorization is terminated  or revoked sooner.       Influenza A by PCR NEGATIVE NEGATIVE Final   Influenza B by PCR NEGATIVE NEGATIVE Final    Comment: (NOTE) The Xpert Xpress SARS-CoV-2/FLU/RSV plus assay is intended as an aid in the diagnosis of influenza from Nasopharyngeal swab specimens and should not be used as a sole basis for treatment. Nasal washings and aspirates are unacceptable for Xpert Xpress SARS-CoV-2/FLU/RSV testing.  Fact Sheet for Patients: BloggerCourse.com  Fact Sheet for Healthcare Providers: SeriousBroker.it  This test is not yet approved or cleared by the [place] FDA and has been authorized for detection and/or diagnosis of SARS-CoV-2 by FDA under an Emergency Use Authorization (EUA). This EUA will remain in effect (meaning this test can be used) for the duration of the COVID-19 declaration under Section 564(b)(1) of the Act, 21 U.S.C. section 360bbb-3(b)(1), unless the authorization is terminated or revoked.  Performed at [facility]   MRSA PCR Screening     Status: None   Collection Time: 02/06/21 10:52 AM   Specimen: Nasal Mucosa; Nasopharyngeal  Result Value Ref Range Status   MRSA by PCR NEGATIVE NEGATIVE Final    Comment:        The GeneXpert MRSA Assay (FDA approved for NASAL specimens only), is one component of a comprehensive MRSA colonization surveillance program. It is not intended to diagnose MRSA infection nor to guide or monitor treatment for MRSA infections. Performed at [facility], [address]  [place], [place] [address]     Procedures and diagnostic studies:  CT Abdomen Pelvis W Contrast  Result Date: 02/05/2021 CLINICAL DATA:  Diverticulitis suspected. Nausea, vomiting, diarrhea for 5-6 days. Back pain and weakness. EXAM: CT ABDOMEN  AND PELVIS WITH CONTRAST TECHNIQUE: Multidetector CT imaging of the abdomen and pelvis was performed using the standard protocol following bolus administration of intravenous contrast. CONTRAST:  OMNIPAQUE IOHEXOL 300 MG/ML  SOLN COMPARISON:  Right upper quadrant ultrasound 02/26/2020 FINDINGS: Lower chest: Lung bases are clear. No acute airspace disease or pleural fluid. Hepatobiliary: Diffusely decreased hepatic density typical of steatosis. No focal hepatic abnormality. Gallbladder physiologically distended, no calcified stone. No biliary dilatation. Pancreas: No ductal dilatation or inflammation. Spleen: Normal in size without focal abnormality. Adrenals/Urinary Tract: Normal adrenal glands. No hydronephrosis or perinephric edema. Homogeneous renal enhancement with symmetric excretion on delayed phase imaging. No visualized renal calculi or focal renal lesion. Urinary bladder is physiologically distended without wall thickening. Stomach/Bowel: Decompressed stomach. Possible gastric wall thickening not well assessed due to nondistention. Normal positioning of the duodenum and ligament of Treitz. There is no small bowel obstruction or inflammatory change. Normal appendix. Minimal diverticulosis primarily involving the right colon. No diverticulitis. Majority of the colon is decompressed which limits detailed assessment. No obvious colonic wall thickening. No pericolonic edema Vascular/Lymphatic: Mild aortic atherosclerosis. No aortic aneurysm. Patent portal and mesenteric veins. No enlarged lymph nodes in the abdomen or pelvis. Reproductive: Mildly enlarged prostate gland. Other: No free air, free fluid, or intra-abdominal fluid collection. Musculoskeletal: There  are no acute or suspicious osseous abnormalities. Facet hypertrophy in the lower lumbar spine. IMPRESSION: 1. Minimal right colonic diverticulosis without diverticulitis. 2. Possible gastric wall thickening versus nondistention, suggesting gastritis or peptic ulcer disease. 3. Mild hepatic steatosis. 4. Mildly enlarged prostate gland. Aortic Atherosclerosis (ICD10-I70.0). Electronically Signed   By: Narda Rutherford M.D.   On: 02/05/2021 19:47    Medications:   . enoxaparin (LOVENOX) injection  40 mg Subcutaneous Q24H  . folic acid  1 mg Oral Daily  . gabapentin  300 mg Oral QHS  . LORazepam  0-4 mg Intravenous Q4H   Followed by  . [START ON 02/08/2021] LORazepam  0-4 mg Intravenous Q8H  . multivitamin with minerals  1 tablet Oral Daily  . nicotine  21 mg Transdermal Daily  . sodium chloride flush  3 mL Intravenous Q12H  . thiamine  100 mg Oral Daily   Or  . thiamine  100 mg Intravenous Daily   Continuous Infusions: . fluconazole (DIFLUCAN) IV 200 mg (02/06/21 1615)  . lactated ringers 75 mL/hr at 02/07/21 1200     LOS: 1 day   Joseph Art  [facility]   How to contact the [facility] Attending or Consulting provider 7A - 7P or covering provider during after hours 7P -7A, for this patient?  1. Check the care team in [facility] and look for a) attending/consulting [facility] provider listed and b) the [facility] team listed 2. Log into [email or web address] and use [facility]'s universal password to access. If you do not have the password, please contact the hospital operator. 3. Locate the [facility] provider you are looking for under [facility] and page to a number that you can be directly reached. 4. If you still have difficulty reaching the provider, please page the [facility] (Director on Call) for the Hospitalists listed on amion for assistance.  02/07/2021, 1:11 PM

## 2021-02-07 NOTE — Plan of Care (Signed)
  Problem: Clinical Measurements: Goal: Diagnostic test results will improve Outcome: Progressing   Problem: Nutrition: Goal: Adequate nutrition will be maintained Outcome: Progressing   Problem: Pain Managment: Goal: General experience of comfort will improve Outcome: Progressing   Problem: Skin Integrity: Goal: Risk for impaired skin integrity will decrease Outcome: Progressing

## 2021-02-08 DIAGNOSIS — E871 Hypo-osmolality and hyponatremia: Secondary | ICD-10-CM | POA: Diagnosis not present

## 2021-02-08 DIAGNOSIS — E43 Unspecified severe protein-calorie malnutrition: Secondary | ICD-10-CM | POA: Insufficient documentation

## 2021-02-08 LAB — BASIC METABOLIC PANEL
Anion gap: 7 (ref 5–15)
BUN: <5 mg/dL — ABNORMAL LOW (ref 6–20)
CO2: 28 mmol/L (ref 22–32)
Calcium: 9.2 mg/dL (ref 8.9–10.3)
Chloride: 94 mmol/L — ABNORMAL LOW (ref 98–111)
Creatinine, Ser: 0.82 mg/dL (ref 0.61–1.24)
GFR, Estimated: >60 mL/min (ref >60)
Glucose, Bld: 99 mg/dL (ref 70–99)
Potassium: 3.9 mmol/L (ref 3.5–5.1)
Sodium: 129 mmol/L — ABNORMAL LOW (ref 135–145)

## 2021-02-08 LAB — CBC
HCT: 29.3 % — ABNORMAL LOW (ref 39.0–52.0)
Hemoglobin: 10.5 g/dL — ABNORMAL LOW (ref 13.0–17.0)
MCH: 31.8 pg (ref 26.0–34.0)
MCHC: 35.8 g/dL (ref 30.0–36.0)
MCV: 88.8 fL (ref 80.0–100.0)
Platelets: 126 10*3/uL — ABNORMAL LOW (ref 150–400)
RBC: 3.3 MIL/uL — ABNORMAL LOW (ref 4.22–5.81)
RDW: 13.8 % (ref 11.5–15.5)
WBC: 5.4 10*3/uL (ref 4.0–10.5)
nRBC: 0 % (ref 0.0–0.2)

## 2021-02-08 MED ORDER — ENSURE ENLIVE PO LIQD
237.0000 mL | Freq: Three times a day (TID) | ORAL | Status: DC
  Administered 2021-02-08 – 2021-02-10 (×7): 237 mL via ORAL

## 2021-02-08 NOTE — Plan of Care (Signed)
  Problem: Education: Goal: Knowledge of General Education information will improve Description: Including pain rating scale, medication(s)/side effects and non-pharmacologic comfort measures Outcome: Progressing   Problem: Health Behavior/Discharge Planning: Goal: Ability to manage health-related needs will improve Outcome: Progressing   Problem: Clinical Measurements: Goal: Ability to maintain clinical measurements within normal limits will improve Outcome: Progressing   Problem: Nutrition: Goal: Adequate nutrition will be maintained Outcome: Progressing   Problem: Coping: Goal: Level of anxiety will decrease Outcome: Progressing   Problem: Elimination: Goal: Will not experience complications related to urinary retention Outcome: Progressing   Problem: Safety: Goal: Ability to remain free from injury will improve Outcome: Progressing   Problem: Skin Integrity: Goal: Risk for impaired skin integrity will decrease Outcome: Progressing

## 2021-02-08 NOTE — Consult Note (Signed)
WOC Nurse wound consult note Consultation was completed by review of records, images and assistance from the bedside nurse/clinical staff.    Reason for Consult: candidiasis Discussed with bedside nurse; reported poor hygiene in chronic alcoholic.    Wound type: partial thickness; rash; ITD with possible MASD  Pressure Injury POA: NA Measurement:NA Wound bed:see nursing flowsheets descriptions in integumentary section Drainage (amount, consistency, odor) see nursing flow sheets Periwound: intact  Dressing procedure/placement/frequency: Add silver wicking fabric for moisture management and treatment of candidiasis. DC all creams and powders to the area.   Discussed POC with patient and bedside nurse.  Re consult if needed, will not follow at this time. Thanks  Micah Barnier [employer], RN,CWOCN, CNS, CWON-AP [phone])

## 2021-02-08 NOTE — Progress Notes (Signed)
Progress Note    Cameron Rogers  RJJ:[id number] DOB: [birth year]  DOA: 02/05/2021 PCP: [name], DO    Brief Narrative:    Medical records reviewed and are as summarized below:  Cameron Rogers is an 55 y.o. male with medical history significant of HTN and ETOH dependence presenting with N/V/D.  He went to work on Wednesday and felt kind of bad.  He didn't have any energy and they sent him home since he was unable to do his job.  They told him he had to get well or he couldn't keep working there.  He stayed home for a whole week and not getting attention.  He was unable to keep anything down, having severe fatigue and n/v/d.  Multiple medical issues reported by family.  Electrolyte abnormalities Candidiasis, alcohol abuse/withdrawal, FTT  Assessment/Plan:   Principal Problem:   Hyponatremia Active Problems:   Essential hypertension   Elevated PSA   Elevated liver function tests   Prediabetes   Alcohol withdrawal (HCC)   Alcohol dependence with uncomplicated withdrawal (HCC)   Nausea, vomiting, and diarrhea   Groin rash   Unexplained weight loss   Hypovolemic hyponatremia -Etiology appears to be hypovolemic hyponatremia; patient with recent GI symptoms and reported 7-10 days of decreased PO intake.   -Likely also in conjunction with beer potomania. -slowly improving  N/V/D -check GI pathogen panel and c diff -will d/c if no longer having diarrhea  -CT Scan shows: Possible gastric wall thickening versus nondistention, suggesting gastritis or peptic ulcer disease.-- may need EGD -check occult blood  ETOH abuse with elevated LFTs -Elevated LFTs -Reports h/o heavy ETOH use, continuing to drink daily -CIWA protocol ordered  Thrombocytopenia -Mild, likely associated with decreased bone marrow production in the setting of ETOH overuse -He has had epistaxis and gross hematuria as well as the rectal bleeding as noted above - possibly related to  this? -stable  Groin rash -Extensive rash, will give IV Dilfucan for now -Wound care consultation -HIV negative  Elevated PSA -4.8 in 04/2019 -repeat pending -Needs outpatient f/u  HTN -Previously on Norvasc -Currently only on Cozaar HCT; will hold for now given hyponatremia -Will cover with IV prn hydralazine  Prediabetes -A1c 5.9 in 04/2019 -Will follow fasting glucose for now  Unexplained weight loss -He has last 13 kg since 02/2020 hospitalization -Needs ongoing evaluation -Nutrition consult   Family Communication/Anticipated D/C date and plan/Code Status   DVT prophylaxis: scd Code Status: Full Code.  Disposition Plan: Status is: Inpatient Spoke with sister 4/24 Remains inpatient appropriate because:Inpatient level of care appropriate due to severity of illness   Dispo: The patient is from: Home              Anticipated d/c is to: Home              Patient currently is not medically stable to d/c.   Difficult to place patient No         Medical Consultants:    None.     Subjective:   Eating breakfast but falls asleep easily  Objective:    Vitals:   02/08/21 0744 02/08/21 0800 02/08/21 0900 02/08/21 1000  BP: (!) 129/95 132/85 113/89 (!) 135/97  Pulse: 65 71 71 76  Resp: 19     Temp: 97.6 F (36.4 C)     TempSrc: Axillary     SpO2: 97%     Weight:      Height:  Intake/Output Summary (Last 24 hours) at 02/08/2021 1052 Last data filed at 02/08/2021 0330 Gross per 24 hour  Intake 1908.43 ml  Output 775 ml  Net 1133.43 ml   Filed Weights   02/05/21 1751  Weight: 73.6 kg     General: Appearance:    Ill appearing male in no acute distress   Skin with breakdown on scrotum  Lungs:     respirations unlabored  Heart:    Normal heart rate. Normal rhythm. No murmurs, rubs, or gallops.   MS:   All extremities are intact.   Neurologic:   Awake, pleasantly confused    Data Reviewed:   I have personally reviewed following  labs and imaging studies:  Labs: Labs show the following:   Basic Metabolic Panel: Recent Labs  Lab 02/06/21 1409 02/06/21 2047 02/07/21 0016 02/07/21 0426 02/07/21 0953 02/08/21 0032  NA 120* 123* 122* 121* 128* 129*  K 3.7 3.8 3.5 3.7 3.5 3.9  CL 87* 89* 86* 88* 90* 94*  CO2 19* 24 23 23 29 28   GLUCOSE 118* 116* 133* 116* 100* 99  BUN 9 7 <5* <5* <5* <5*  CREATININE 1.07 0.88 0.87 0.84 0.79 0.82  CALCIUM 8.7* 8.8* 8.9 9.2 9.3 9.2  MG 1.9  --   --   --   --   --   PHOS 2.0*  --   --   --   --   --    GFR Estimated Creatinine Clearance: 107.2 mL/min (by C-G formula based on SCr of 0.82 mg/dL). Liver Function Tests: Recent Labs  Lab 02/05/21 1800 02/06/21 1409  AST 175* 214*  ALT 105* 104*  ALKPHOS 368* 327*  BILITOT 4.1* 3.8*  PROT 7.3 6.6  ALBUMIN 3.8 2.9*   Recent Labs  Lab 02/05/21 1800  LIPASE 58*   Recent Labs  Lab 02/07/21 0953  AMMONIA 37*   Coagulation profile No results for input(s): INR, PROTIME in the last 168 hours.  CBC: Recent Labs  Lab 02/05/21 1800 02/06/21 1409 02/07/21 0426 02/08/21 0032  WBC 8.8 6.4 5.6 5.4  NEUTROABS 6.0  --   --   --   HGB 13.5 12.2* 11.5* 10.5*  HCT 35.9* 33.7* 31.9* 29.3*  MCV 83.5 86.4 86.9 88.8  PLT 139* 122* 124* 126*   Cardiac Enzymes: No results for input(s): CKTOTAL, CKMB, CKMBINDEX, TROPONINI in the last 168 hours. BNP (last 3 results) No results for input(s): PROBNP in the last 8760 hours. CBG: Recent Labs  Lab 02/05/21 1753  GLUCAP 94   D-Dimer: No results for input(s): DDIMER in the last 72 hours. Hgb A1c: No results for input(s): HGBA1C in the last 72 hours. Lipid Profile: No results for input(s): CHOL, HDL, LDLCALC, TRIG, CHOLHDL, LDLDIRECT in the last 72 hours. Thyroid function studies: No results for input(s): TSH, T4TOTAL, T3FREE, THYROIDAB in the last 72 hours.  Invalid input(s): FREET3 Anemia work up: No results for input(s): VITAMINB12, FOLATE, FERRITIN, TIBC, IRON,  RETICCTPCT in the last 72 hours. Sepsis Labs: Recent Labs  Lab 02/05/21 1800 02/05/21 1823 02/06/21 0415 02/06/21 1409 02/07/21 0426 02/08/21 0032  WBC 8.8  --   --  6.4 5.6 5.4  LATICACIDVEN  --  4.1* 1.5  --   --   --     Microbiology Recent Results (from the past 240 hour(s))  Resp Panel by RT-PCR (Flu A&B, Covid) Nasopharyngeal Swab     Status: None   Collection Time: 02/05/21  7:01 PM   Specimen:  Nasopharyngeal Swab; Nasopharyngeal(NP) swabs in vial transport medium  Result Value Ref Range Status   SARS Coronavirus 2 by RT PCR NEGATIVE NEGATIVE Final    Comment: (NOTE) SARS-CoV-2 target nucleic acids are NOT DETECTED.  The SARS-CoV-2 RNA is generally detectable in upper respiratory specimens during the acute phase of infection. The lowest concentration of SARS-CoV-2 viral copies this assay can detect is 138 copies/mL. A negative result does not preclude SARS-Cov-2 infection and should not be used as the sole basis for treatment or other patient management decisions. A negative result may occur with  improper specimen collection/handling, submission of specimen other than nasopharyngeal swab, presence of viral mutation(s) within the areas targeted by this assay, and inadequate number of viral copies(<138 copies/mL). A negative result must be combined with clinical observations, patient history, and epidemiological information. The expected result is Negative.  Fact Sheet for Patients:  BloggerCourse.comhttps://www.fda.gov/media/152166/download  Fact Sheet for Healthcare Providers:  SeriousBroker.ithttps://www.fda.gov/media/152162/download  This test is no t yet approved or cleared by the [place] FDA and  has been authorized for detection and/or diagnosis of SARS-CoV-2 by FDA under an Emergency Use Authorization (EUA). This EUA will remain  in effect (meaning this test can be used) for the duration of the COVID-19 declaration under Section 564(b)(1) of the Act, 21 U.S.C.section  360bbb-3(b)(1), unless the authorization is terminated  or revoked sooner.       Influenza A by PCR NEGATIVE NEGATIVE Final   Influenza B by PCR NEGATIVE NEGATIVE Final    Comment: (NOTE) The Xpert Xpress SARS-CoV-2/FLU/RSV plus assay is intended as an aid in the diagnosis of influenza from Nasopharyngeal swab specimens and should not be used as a sole basis for treatment. Nasal washings and aspirates are unacceptable for Xpert Xpress SARS-CoV-2/FLU/RSV testing.  Fact Sheet for Patients: BloggerCourse.comhttps://www.fda.gov/media/152166/download  Fact Sheet for Healthcare Providers: SeriousBroker.ithttps://www.fda.gov/media/152162/download  This test is not yet approved or cleared by the [place] FDA and has been authorized for detection and/or diagnosis of SARS-CoV-2 by FDA under an Emergency Use Authorization (EUA). This EUA will remain in effect (meaning this test can be used) for the duration of the COVID-19 declaration under Section 564(b)(1) of the Act, 21 U.S.C. section 360bbb-3(b)(1), unless the authorization is terminated or revoked.  Performed at [facility]   MRSA PCR Screening     Status: None   Collection Time: 02/06/21 10:52 AM   Specimen: Nasal Mucosa; Nasopharyngeal  Result Value Ref Range Status   MRSA by PCR NEGATIVE NEGATIVE Final    Comment:        The GeneXpert MRSA Assay (FDA approved for NASAL specimens only), is one component of a comprehensive MRSA colonization surveillance program. It is not intended to diagnose MRSA infection nor to guide or monitor treatment for MRSA infections. Performed at [facility], [address] [place], [place] [address]     Procedures and diagnostic studies:  No results found.  Medications:   . enoxaparin (LOVENOX) injection  40 mg Subcutaneous Q24H  . folic acid  1 mg Oral Daily  . gabapentin  300 mg Oral QHS  . multivitamin with minerals  1 tablet Oral Daily  . nicotine  21 mg Transdermal Daily  . sodium  chloride flush  3 mL Intravenous Q12H  . thiamine  100 mg Oral Daily   Or  . thiamine  100 mg Intravenous Daily   Continuous Infusions: . fluconazole (DIFLUCAN) IV 200 mg (02/07/21 1630)  . lactated ringers 75 mL/hr at 02/08/21  0719     LOS: 2 days   Joseph Art  [facility]   How to contact the [facility] Attending or Consulting provider 7A - 7P or covering provider during after hours 7P -7A, for this patient?  1. Check the care team in [facility] and look for a) attending/consulting TRH provider listed and b) the [facility] team listed 2. Log into [email or web address] and use [facility]'s universal password to access. If you do not have the password, please contact the hospital operator. 3. Locate the [facility] provider you are looking for under [facility] and page to a number that you can be directly reached. 4. If you still have difficulty reaching the provider, please page the [facility] (Director on Call) for the Hospitalists listed on amion for assistance.  02/08/2021, 10:52 AM

## 2021-02-08 NOTE — Progress Notes (Signed)
Physical Therapy Treatment Patient Details Name: Cameron Rogers MRN: [id number] DOB: 01/09/1966 Today's Date: 02/08/2021    History of Present Illness Pt is a 55 y.o. male admitted 02/05/21 with nausea, vomiting, diarrhea, but still able to consume alcohol; workup for hypovolemic hyponatremia, suspected candidiasis of L groin. PMH includes ETOH use, HTN, anemia, arthritis, COVID-19 (02/2020).    PT Comments    Pt received in supine, sleeping but easily awoken and confused (oriented to self) but with fair tolerance for transfer/gait training. Pt limited due to urinary incontinence and tachycardia (transient, resolved before RN arrived to room). Pt able to perform seated/standing reaching activity with up to minA +2 for standing balance (he did better using cane but does not like using it) and Supervision for bed mobility. SpO2 WNL on RA. Pt continues to benefit from PT services to progress toward functional mobility goals. Continue to recommend pt return home once medically cleared however he remains confused so would benefit from increased supervision/assist from family or friends initially.  Follow Up Recommendations  No PT follow up;Supervision for mobility/OOB (pending progress)     Equipment Recommendations  Gilmer Mor (will continue to assess pending cognition, pt currently refusing)    Recommendations for Other Services       Precautions / Restrictions Precautions Precautions: Fall Precaution Comments: CIWA; urinary incontinence Restrictions Weight Bearing Restrictions: No    Mobility  Bed Mobility Overal bed mobility: Needs Assistance Bed Mobility: Supine to Sit;Sit to Supine     Supine to sit: Supervision Sit to supine: Supervision   General bed mobility comments: increased time, use of bed features to perform    Transfers Overall transfer level: Needs assistance Equipment used: None;Straight cane Transfers: Sit to/from Stand Sit to Stand: Min assist         General  transfer comment: increased time and effort, from EOB x3 reps, used cane one rep but refusing other 2 reps  Ambulation/Gait Ambulation/Gait assistance: Min assist;+2 safety/equipment Gait Distance (Feet): 20 Feet Assistive device: Straight cane Gait Pattern/deviations: Step-to pattern;Shuffle;Drifts right/left;Trunk flexed;Narrow base of support Gait velocity: Decreased   General Gait Details: poor safety awareness and hesitant to use cane, he used it to walk to [facility] and back however after returning to EOB HR spiked into 180's bpm so deferred further gait, pt reports asymptomatic; SpO2 WNL on RA   Stairs             Wheelchair Mobility    Modified Rankin (Stroke Patients Only)       Balance Overall balance assessment: Needs assistance   Sitting balance-Leahy Scale: Fair     Standing balance support: Single extremity supported Standing balance-Leahy Scale: Poor Standing balance comment: reliant on minA and AD for short gait task in room                            Cognition Arousal/Alertness: Awake/alert;Lethargic Behavior During Therapy: Flat affect;Restless;Impulsive Overall Cognitive Status: Impaired/Different from baseline Area of Impairment: Orientation;Attention;Memory;Safety/judgement;Awareness;Problem solving                 Orientation Level: Disoriented to;Time;Situation Current Attention Level: Sustained Memory: Decreased short-term memory Following Commands: Follows one step commands consistently Safety/Judgement: Decreased awareness of deficits;Decreased awareness of safety Awareness: Intellectual Problem Solving: Requires verbal cues;Difficulty sequencing General Comments: Pt frustrated with length of stay and requesting to leave hospital, bed alarm activated at end of session as per RN he has been trying to leave AMA but is confused  at this time.      Exercises      General Comments General comments (skin integrity, edema, etc.):  HR 60's bpm resting/sleeping, to 80's bpm seated and brief spike 140-180 bpm standing EOB so returned him to supine with bed alarm on; BP WNL seated/supine and pt falling asleep at end of session, RN requesting PTA not check as he has been difficult to get to sleep and agitated all day due to AMS      Pertinent Vitals/Pain Pain Assessment: Faces Faces Pain Scale: Hurts little more Pain Location: back of scrotum (skin breakdown), RN notified (she is awaiting special dressing for it to be sent) Pain Descriptors / Indicators: Tender;Burning Pain Intervention(s): Monitored during session;Repositioned (barrier cream applied between his legs and around sides of scrotum but did not apply over open skin areas per wound care note (pt insisting on cream))    Home Living                      Prior Function            PT Goals (current goals can now be found in the care plan section) Acute Rehab PT Goals Patient Stated Goal: to drink some coffee and get out of the bed PT Goal Formulation: With patient Time For Goal Achievement: 02/21/21 Potential to Achieve Goals: Fair Progress towards PT goals: Progressing toward goals    Frequency    Min 3X/week      PT Plan Current plan remains appropriate    Co-evaluation              AM-PAC PT "6 Clicks" Mobility   Outcome Measure  Help needed turning from your back to your side while in a flat bed without using bedrails?: None Help needed moving from lying on your back to sitting on the side of a flat bed without using bedrails?: A Little Help needed moving to and from a bed to a chair (including a wheelchair)?: A Little Help needed standing up from a chair using your arms (e.g., wheelchair or bedside chair)?: A Little Help needed to walk in hospital room?: A Lot Help needed climbing 3-5 steps with a railing? : A Lot 6 Click Score: 17    End of Session Equipment Utilized During Treatment: Gait belt Activity Tolerance:  Treatment limited secondary to medical complications (Comment);Patient tolerated treatment well;Other (comment) (CIWA/tachycardia) Patient left: in bed;with call bell/phone within reach;with bed alarm set Nurse Communication: Mobility status;Other (comment) (he needs dressing for posterior scrotal skin breakdown (per RN it has been ordered)) PT Visit Diagnosis: Other abnormalities of gait and mobility (R26.89);Unsteadiness on feet (R26.81)     Time: 2376-2831 PT Time Calculation (min) (ACUTE ONLY): 32 min  Charges:  $Gait Training: 8-22 mins $Therapeutic Activity: 8-22 mins                     Nasreen Goedecke P., PTA Acute Rehabilitation Services Pager: [phone] Office: [phone]   Angus Palms 02/08/2021, 4:01 PM

## 2021-02-08 NOTE — Evaluation (Signed)
Occupational Therapy Evaluation Patient Details Name: Cameron Rogers MRN: [id number] DOB: 09-25-1966 Today's Date: 02/08/2021    History of Present Illness Pt is a 55 y.o. male admitted 02/05/21 with nausea, vomiting, diarrhea, but still able to consume alcohol; workup for hypovolemic hyponatremia, suspected candidiasis of L groin. PMH includes ETOH use, HTN, anemia, arthritis, COVID-19 (02/2020).   Clinical Impression   Pt was independent prior to admission. 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 Pt presents with lethargy, likely from medication, tremor, generalized weakness and impaired standing balance. He requires min to max assist for ADL and min to mod assist for OOB. Will follow acutely to increase independence in ADL and further assess cognition.     Follow Up Recommendations  No OT follow up;Supervision/Assistance - 24 hour (initially)    Equipment Recommendations   (TBD)    Recommendations for Other Services       Precautions / Restrictions Precautions Precautions: Fall Precaution Comments: CIWA; urinary incontinence      Mobility Bed Mobility Overal bed mobility: Needs Assistance Bed Mobility: Supine to Sit;Sit to Supine     Supine to sit: Supervision Sit to supine: Supervision   General bed mobility comments: increased time    Transfers Overall transfer level: Needs assistance Equipment used: None Transfers: Sit to/from Stand Sit to Stand: Mod assist         General transfer comment: increased time and effort, mod assist to stand and min assist for stability from EOB, took several steps toward [facility]    Balance Overall balance assessment: Needs assistance   Sitting balance-Leahy Scale: Fair       Standing balance-Leahy Scale: Poor                             ADL either performed or assessed with clinical judgement   ADL Overall ADL's : Needs assistance/impaired Eating/Feeding: Minimal assistance;Bed  level Eating/Feeding Details (indicate cue type and reason): assist to cut food, open containers Grooming: Wash/dry hands;Bed level;Minimal assistance Grooming Details (indicate cue type and reason): assist for thoroughness Upper Body Bathing: Moderate assistance;Sitting   Lower Body Bathing: Maximal assistance;Sit to/from stand   Upper Body Dressing : Moderate assistance;Sitting   Lower Body Dressing: Maximal assistance;Sit to/from stand                       Vision Baseline Vision/History: No visual deficits Patient Visual Report: No change from baseline       Perception     Praxis      Pertinent Vitals/Pain Pain Assessment: Faces Faces Pain Scale: No hurt     Hand Dominance Left   Extremity/Trunk Assessment Upper Extremity Assessment Upper Extremity Assessment: Generalized weakness (tremor)   Lower Extremity Assessment Lower Extremity Assessment: Defer to PT evaluation   Cervical / Trunk Assessment Cervical / Trunk Assessment: Normal   Communication Communication Communication: No difficulties   Cognition Arousal/Alertness: Awake/alert;Lethargic Behavior During Therapy: Flat affect Overall Cognitive Status: Impaired/Different from baseline Area of Impairment: Orientation;Attention;Memory;Following commands;Safety/judgement;Awareness;Problem solving                 Orientation Level: Disoriented to;Time;Situation Current Attention Level: Sustained Memory: Decreased short-term memory Following Commands: Follows one step commands with increased time Safety/Judgement: Decreased awareness of deficits;Decreased awareness of safety Awareness: Intellectual Problem Solving: Requires verbal cues;Difficulty sequencing General Comments: Pt calm and communicative.   General Comments  Exercises     Shoulder Instructions      Home Living Family/patient expects to be discharged to:: Private residence Living Arrangements: Other relatives  (nephew) Available Help at Discharge: Family;Available 24 hours/day Type of Home: Apartment Home Access: Level entry     Home Layout: One level     Bathroom Shower/Tub: Chief Strategy Officer: Standard     Home Equipment: None          Prior Functioning/Environment Level of Independence: Independent        Comments: Pt works, takes the bus to work.        OT Problem List: Decreased strength;Decreased activity tolerance;Impaired balance (sitting and/or standing);Decreased coordination;Decreased cognition;Decreased safety awareness;Decreased knowledge of use of DME or AE;Impaired UE functional use      OT Treatment/Interventions: Self-care/ADL training;DME and/or AE instruction;Patient/family education;Balance training;Therapeutic activities    OT Goals(Current goals can be found in the care plan section) Acute Rehab OT Goals Patient Stated Goal: to eat his breakfast OT Goal Formulation: With patient Time For Goal Achievement: 02/22/21 Potential to Achieve Goals: Good ADL Goals Pt Will Perform Eating: Independently;sitting Pt Will Perform Grooming: with supervision;standing Pt Will Perform Upper Body Dressing: with set-up;sitting Pt Will Perform Lower Body Dressing: with supervision;sit to/from stand Pt Will Transfer to Toilet: with supervision;ambulating Pt Will Perform Toileting - Clothing Manipulation and hygiene: with supervision;sit to/from stand  OT Frequency: Min 2X/week   Barriers to D/C:            Co-evaluation              AM-PAC OT "6 Clicks" Daily Activity     Outcome Measure Help from another person eating meals?: A Little Help from another person taking care of personal grooming?: A Little Help from another person toileting, which includes using toliet, bedpan, or urinal?: A Lot Help from another person bathing (including washing, rinsing, drying)?: A Lot Help from another person to put on and taking off regular upper body  clothing?: A Lot Help from another person to put on and taking off regular lower body clothing?: A Lot 6 Click Score: 14   End of Session    Activity Tolerance: Patient limited by lethargy Patient left: in bed;with call bell/phone within reach;with bed alarm set  OT Visit Diagnosis: Unsteadiness on feet (R26.81);Muscle weakness (generalized) (M62.81);Other symptoms and signs involving cognitive function                Time: 3662-9476 OT Time Calculation (min): 24 min Charges:  OT General Charges $OT Visit: 1 Visit OT Evaluation $OT Eval Moderate Complexity: 1 Mod OT Treatments $Self Care/Home Management : 8-22 mins  Evern Bio 02/08/2021, 10:48 AM  Martie Round, OTR/L Acute Rehabilitation Services Pager: [phone] Office: [phone]

## 2021-02-08 NOTE — Progress Notes (Signed)
Initial Nutrition Assessment  DOCUMENTATION CODES:   Severe malnutrition in context of chronic illness  INTERVENTION:   Ensure Enlive po TID, each supplement provides 350 kcal and 20 grams of protein  Magic cup TID with meals, each supplement provides 290 kcal and 9 grams of protein  Continue MVI, folic acid and thiamine   Pt at high refeed risk; recommend monitor potassium, magnesium and phosphorus labs daily until stable  NUTRITION DIAGNOSIS:   Severe Malnutrition related to chronic illness (alcoholic liver disease, etoh abuse) as evidenced by moderate fat depletion,severe fat depletion,moderate muscle depletion,severe muscle depletion.  GOAL:   Patient will meet greater than or equal to 90% of their needs  MONITOR:   PO intake,Supplement acceptance,Labs,Weight trends,Skin,I & O's  REASON FOR ASSESSMENT:   Consult Assessment of nutrition requirement/status  ASSESSMENT:   55 y/o male with h/o etoh abuse, liver disease and HTN who is admitted with GIB, nausea, vomting and diarrhea.   Met with pt in room today. Pt sitting up at time of RD visit and had just finished eating 100% of his breakfast. Pt is a poor historian. Patient reports decreased oral intake at baseline but reports that his appetite has been poor since last Wednesday r/t nausea, vomiting and diarrhea. When asked why he does not eat well at baseline, pt reports that food makes him feel sluggish. RD suspects pt with poor oral intake at baseline r/t etoh abuse. Pt reports that his appetite is good in hospital; pt documented to be eating 50-100% of meals. Pt reports that he does not drink supplements at home but he is willing to drink them in hospital. RD discussed with pt the importance of adequate nutrition needed to preserve lean muscle. Recommended daily supplements at home. Per chart, pt is down 15lbs(8%) in < 2 months; this is significant weight loss. Pt is aware of the weight loss and reports that he had lost  more weight but that he had started to gain some back now. RD will add supplements to help pt meet his estimated needs. Pt is likely at high refeed risk.    Medications reviewed and include: lovenox, folic acid, MVI, nicotine, thiamine, diflucan, LRS '@75ml' /hr  Labs reviewed: Na 129(L), BUN <5(L) Hgb 10.5(L), Hct 29.3(L)  NUTRITION - FOCUSED PHYSICAL EXAM:  Flowsheet Row Most Recent Value  Orbital Region Moderate depletion  Upper Arm Region Severe depletion  Thoracic and Lumbar Region Severe depletion  Buccal Region Moderate depletion  Temple Region Moderate depletion  Clavicle Bone Region Moderate depletion  Clavicle and Acromion Bone Region Moderate depletion  Scapular Bone Region Moderate depletion  Dorsal Hand Severe depletion  Patellar Region Severe depletion  Anterior Thigh Region Severe depletion  Posterior Calf Region Severe depletion  Edema (RD Assessment) None  Hair Reviewed  Eyes Reviewed  Mouth Reviewed  Skin Reviewed  Nails Reviewed     Diet Order:   Diet Order            DIET SOFT Room service appropriate? Yes; Fluid consistency: Thin  Diet effective now                EDUCATION NEEDS:   Education needs have been addressed  Skin:  Skin Assessment: Reviewed RN Assessment (Scrotal erythema without edema, MASD)  Last BM:  4/23- type 7  Height:   Ht Readings from Last 1 Encounters:  02/05/21 '6\' 2"'  (1.88 m)    Weight:   Wt Readings from Last 1 Encounters:  02/05/21 73.6 kg  Ideal Body Weight:  86.3 kg  BMI:  Body mass index is 20.84 kg/m.  Estimated Nutritional Needs:   Kcal:  2200-2500kcal/day  Protein:  110-125g/day  Fluid:  2.2-2.5L/day  Koleen Distance MS, RD, LDN Please refer to [facility] for RD and/or RD on-call/weekend/after hours pager

## 2021-02-09 DIAGNOSIS — E871 Hypo-osmolality and hyponatremia: Secondary | ICD-10-CM | POA: Diagnosis not present

## 2021-02-09 LAB — COMPREHENSIVE METABOLIC PANEL
ALT: 81 U/L — ABNORMAL HIGH (ref 0–44)
AST: 109 U/L — ABNORMAL HIGH (ref 15–41)
Albumin: 2.6 g/dL — ABNORMAL LOW (ref 3.5–5.0)
Alkaline Phosphatase: 303 U/L — ABNORMAL HIGH (ref 38–126)
Anion gap: 9 (ref 5–15)
BUN: 5 mg/dL — ABNORMAL LOW (ref 6–20)
CO2: 29 mmol/L (ref 22–32)
Calcium: 9.2 mg/dL (ref 8.9–10.3)
Chloride: 94 mmol/L — ABNORMAL LOW (ref 98–111)
Creatinine, Ser: 0.88 mg/dL (ref 0.61–1.24)
GFR, Estimated: >60 mL/min (ref >60)
Glucose, Bld: 134 mg/dL — ABNORMAL HIGH (ref 70–99)
Potassium: 3.9 mmol/L (ref 3.5–5.1)
Sodium: 132 mmol/L — ABNORMAL LOW (ref 135–145)
Total Bilirubin: 1.3 mg/dL — ABNORMAL HIGH (ref 0.3–1.2)
Total Protein: 5.8 g/dL — ABNORMAL LOW (ref 6.5–8.1)

## 2021-02-09 LAB — CBC
HCT: 30.6 % — ABNORMAL LOW (ref 39.0–52.0)
Hemoglobin: 10.6 g/dL — ABNORMAL LOW (ref 13.0–17.0)
MCH: 31.6 pg (ref 26.0–34.0)
MCHC: 34.6 g/dL (ref 30.0–36.0)
MCV: 91.3 fL (ref 80.0–100.0)
Platelets: 154 10*3/uL (ref 150–400)
RBC: 3.35 MIL/uL — ABNORMAL LOW (ref 4.22–5.81)
RDW: 14.4 % (ref 11.5–15.5)
WBC: 6.8 10*3/uL (ref 4.0–10.5)
nRBC: 0 % (ref 0.0–0.2)

## 2021-02-09 LAB — AMMONIA: Ammonia: 24 umol/L (ref 9–35)

## 2021-02-09 NOTE — Progress Notes (Signed)
Progress Note    Cameron Rogers  TDV:[id number] DOB: 15-Apr-1966  DOA: 02/05/2021 PCP: [name], DO    Brief Narrative:    Medical records reviewed and are as summarized below:  Cameron Rogers is an 55 y.o. male with medical history significant of HTN and ETOH dependence presenting with N/V/D.  He went to work on Wednesday and felt kind of bad.  He didn't have any energy and they sent him home since he was unable to do his job.  They told him he had to get well or he couldn't keep working there.  He stayed home for a whole week and not getting attention.  He was unable to keep anything down, having severe fatigue and n/v/d.  Multiple medical issues reported by family.  Electrolyte abnormalities Candidiasis, alcohol abuse/withdrawal, FTT.    Assessment/Plan:   Principal Problem:   Hyponatremia Active Problems:   Essential hypertension   Elevated PSA   Elevated liver function tests   Prediabetes   Alcohol withdrawal (HCC)   Alcohol dependence with uncomplicated withdrawal (HCC)   Nausea, vomiting, and diarrhea   Groin rash   Unexplained weight loss   Protein-calorie malnutrition, severe   Hypovolemic hyponatremia -Etiology appears to be hypovolemic hyponatremia; patient with recent GI symptoms and reported 7-10 days of decreased PO intake.   -Likely also in conjunction with beer potomania. -slowly improving  N/V/D -diarrhea resolved  -CT Scan shows: Possible gastric wall thickening versus nondistention, suggesting gastritis or peptic ulcer disease.-- may need EGD -check occult blood  ETOH abuse with elevated LFTs -Elevated LFTs -Reports h/o heavy ETOH use, continuing to drink daily -CIWA protocol ordered  Thrombocytopenia -Mild, likely associated with decreased bone marrow production in the setting of ETOH overuse  Groin rash -Extensive rash, will give IV Dilfucan for now -Wound care consultation -HIV negative  Elevated PSA -4.8 in  04/2019 -repeat pending -Needs outpatient f/u  HTN -Previously on Norvasc -Currently only on Cozaar HCT; will hold for now given hyponatremia -Will cover with IV prn hydralazine  Prediabetes -A1c 5.9 in 04/2019 -Will follow fasting glucose for now  Unexplained weight loss -He has last 13 kg since 02/2020 hospitalization -Needs ongoing evaluation -Nutrition consult - suspect related to alcohol and not eating   Family Communication/Anticipated D/C date and plan/Code Status   DVT prophylaxis: scd Code Status: Full Code.  Disposition Plan: Status is: Inpatient Spoke with sister : Remains inpatient appropriate because:Inpatient level of care appropriate due to severity of illness   Dispo: The patient is from: Home              Anticipated d/c is to: Home              Patient currently is not medically stable to d/c.   Difficult to place patient No         Medical Consultants:    None.     Subjective:   No SOB, no CP, eating well  Objective:    Vitals:   02/09/21 0500 02/09/21 0600 02/09/21 0753 02/09/21 0800  BP: 113/87 114/77 (!) 133/97 (!) 134/95  Pulse: 69 73 79 85  Resp: 15 18 17    Temp:   97.8 F (36.6 C)   TempSrc:   Oral   SpO2: 93% 91% 96%   Weight:      Height:        Intake/Output Summary (Last 24 hours) at 02/09/2021 1202 Last data filed at 02/09/2021 0650 Gross per 24 hour  Intake 2401.59 ml  Output 1425 ml  Net 976.59 ml   Filed Weights   02/05/21 1751 02/08/21 1106 02/09/21 0300  Weight: 73.6 kg 72.2 kg 75.4 kg    General: Appearance:    Chronically ill male in no acute distress, tremors in hands     Lungs:      respirations unlabored  Heart:    Normal heart rate. Normal rhythm. No murmurs, rubs, or gallops.   MS:   All extremities are intact.   Neurologic:   Awake, alert. Poor insight     Data Reviewed:   I have personally reviewed following labs and imaging studies:  Labs: Labs show the following:   Basic  Metabolic Panel: Recent Labs  Lab 02/06/21 1409 02/06/21 2047 02/07/21 0016 02/07/21 0426 02/07/21 0953 02/08/21 0032 02/09/21 0049  NA 120*   < > 122* 121* 128* 129* 132*  K 3.7   < > 3.5 3.7 3.5 3.9 3.9  CL 87*   < > 86* 88* 90* 94* 94*  CO2 19*   < > 23 23 29 28 29   GLUCOSE 118*   < > 133* 116* 100* 99 134*  BUN 9   < > <5* <5* <5* <5* 5*  CREATININE 1.07   < > 0.87 0.84 0.79 0.82 0.88  CALCIUM 8.7*   < > 8.9 9.2 9.3 9.2 9.2  MG 1.9  --   --   --   --   --   --   PHOS 2.0*  --   --   --   --   --   --    < > = values in this interval not displayed.   GFR Estimated Creatinine Clearance: 102.3 mL/min (by C-G formula based on SCr of 0.88 mg/dL). Liver Function Tests: Recent Labs  Lab 02/05/21 1800 02/06/21 1409 02/09/21 0049  AST 175* 214* 109*  ALT 105* 104* 81*  ALKPHOS 368* 327* 303*  BILITOT 4.1* 3.8* 1.3*  PROT 7.3 6.6 5.8*  ALBUMIN 3.8 2.9* 2.6*   Recent Labs  Lab 02/05/21 1800  LIPASE 58*   Recent Labs  Lab 02/07/21 0953 02/09/21 0049  AMMONIA 37* 24   Coagulation profile No results for input(s): INR, PROTIME in the last 168 hours.  CBC: Recent Labs  Lab 02/05/21 1800 02/06/21 1409 02/07/21 0426 02/08/21 0032 02/09/21 0049  WBC 8.8 6.4 5.6 5.4 6.8  NEUTROABS 6.0  --   --   --   --   HGB 13.5 12.2* 11.5* 10.5* 10.6*  HCT 35.9* 33.7* 31.9* 29.3* 30.6*  MCV 83.5 86.4 86.9 88.8 91.3  PLT 139* 122* 124* 126* 154   Cardiac Enzymes: No results for input(s): CKTOTAL, CKMB, CKMBINDEX, TROPONINI in the last 168 hours. BNP (last 3 results) No results for input(s): PROBNP in the last 8760 hours. CBG: Recent Labs  Lab 02/05/21 1753  GLUCAP 94   D-Dimer: No results for input(s): DDIMER in the last 72 hours. Hgb A1c: No results for input(s): HGBA1C in the last 72 hours. Lipid Profile: No results for input(s): CHOL, HDL, LDLCALC, TRIG, CHOLHDL, LDLDIRECT in the last 72 hours. Thyroid function studies: No results for input(s): TSH, T4TOTAL,  T3FREE, THYROIDAB in the last 72 hours.  Invalid input(s): FREET3 Anemia work up: No results for input(s): VITAMINB12, FOLATE, FERRITIN, TIBC, IRON, RETICCTPCT in the last 72 hours. Sepsis Labs: Recent Labs  Lab 02/05/21 1823 02/06/21 0415 02/06/21 1409 02/07/21 0426 02/08/21 0032 02/09/21 0049  WBC  --   --  6.4 5.6 5.4 6.8  LATICACIDVEN 4.1* 1.5  --   --   --   --     Microbiology Recent Results (from the past 240 hour(s))  Resp Panel by RT-PCR (Flu A&B, Covid) Nasopharyngeal Swab     Status: None   Collection Time: 02/05/21  7:01 PM   Specimen: Nasopharyngeal Swab; Nasopharyngeal(NP) swabs in vial transport medium  Result Value Ref Range Status   SARS Coronavirus 2 by RT PCR NEGATIVE NEGATIVE Final    Comment: (NOTE) SARS-CoV-2 target nucleic acids are NOT DETECTED.  The SARS-CoV-2 RNA is generally detectable in upper respiratory specimens during the acute phase of infection. The lowest concentration of SARS-CoV-2 viral copies this assay can detect is 138 copies/mL. A negative result does not preclude SARS-Cov-2 infection and should not be used as the sole basis for treatment or other patient management decisions. A negative result may occur with  improper specimen collection/handling, submission of specimen other than nasopharyngeal swab, presence of viral mutation(s) within the areas targeted by this assay, and inadequate number of viral copies(<138 copies/mL). A negative result must be combined with clinical observations, patient history, and epidemiological information. The expected result is Negative.  Fact Sheet for Patients:  BloggerCourse.comhttps://www.fda.gov/media/152166/download  Fact Sheet for Healthcare Providers:  SeriousBroker.ithttps://www.fda.gov/media/152162/download  This test is no t yet approved or cleared by the [place] FDA and  has been authorized for detection and/or diagnosis of SARS-CoV-2 by FDA under an Emergency Use Authorization (EUA). This EUA will remain  in  effect (meaning this test can be used) for the duration of the COVID-19 declaration under Section 564(b)(1) of the Act, 21 U.S.C.section 360bbb-3(b)(1), unless the authorization is terminated  or revoked sooner.       Influenza A by PCR NEGATIVE NEGATIVE Final   Influenza B by PCR NEGATIVE NEGATIVE Final    Comment: (NOTE) The Xpert Xpress SARS-CoV-2/FLU/RSV plus assay is intended as an aid in the diagnosis of influenza from Nasopharyngeal swab specimens and should not be used as a sole basis for treatment. Nasal washings and aspirates are unacceptable for Xpert Xpress SARS-CoV-2/FLU/RSV testing.  Fact Sheet for Patients: BloggerCourse.comhttps://www.fda.gov/media/152166/download  Fact Sheet for Healthcare Providers: SeriousBroker.ithttps://www.fda.gov/media/152162/download  This test is not yet approved or cleared by the [place] FDA and has been authorized for detection and/or diagnosis of SARS-CoV-2 by FDA under an Emergency Use Authorization (EUA). This EUA will remain in effect (meaning this test can be used) for the duration of the COVID-19 declaration under Section 564(b)(1) of the Act, 21 U.S.C. section 360bbb-3(b)(1), unless the authorization is terminated or revoked.  Performed at [facility]   MRSA PCR Screening     Status: None   Collection Time: 02/06/21 10:52 AM   Specimen: Nasal Mucosa; Nasopharyngeal  Result Value Ref Range Status   MRSA by PCR NEGATIVE NEGATIVE Final    Comment:        The GeneXpert MRSA Assay (FDA approved for NASAL specimens only), is one component of a comprehensive MRSA colonization surveillance program. It is not intended to diagnose MRSA infection nor to guide or monitor treatment for MRSA infections. Performed at [facility], [address] [place], [place] [address]     Procedures and diagnostic studies:  No results found.  Medications:   . enoxaparin (LOVENOX) injection  40 mg Subcutaneous Q24H  . feeding supplement   237 mL Oral TID BM  . folic acid  1 mg Oral Daily  . gabapentin  300 mg Oral  QHS  . multivitamin with minerals  1 tablet Oral Daily  . nicotine  21 mg Transdermal Daily  . sodium chloride flush  3 mL Intravenous Q12H  . thiamine  100 mg Oral Daily   Continuous Infusions: . fluconazole (DIFLUCAN) IV 200 mg (02/08/21 1619)     LOS: 3 days   Joseph Art  [facility]   How to contact the [facility] Attending or Consulting provider 7A - 7P or covering provider during after hours 7P -7A, for this patient?  1. Check the care team in [facility] and look for a) attending/consulting TRH provider listed and b) the [facility] team listed 2. Log into [email or web address] and use [place]'s universal password to access. If you do not have the password, please contact the hospital operator. 3. Locate the [facility] provider you are looking for under [facility] and page to a number that you can be directly reached. 4. If you still have difficulty reaching the provider, please page the [facility] (Director on Call) for the Hospitalists listed on amion for assistance.  02/09/2021, 12:02 PM

## 2021-02-09 NOTE — Plan of Care (Signed)
Discussed with patient in front of sister plan of care for the evening, pain management and going through withdraws from alcohol abuse with some teach back dispalyed.  Patient discussing his issues and drink habits openingly.   Problem: Education: Goal: Knowledge of General Education information will improve Description: Including pain rating scale, medication(s)/side effects and non-pharmacologic comfort measures Outcome: Progressing   Problem: Health Behavior/Discharge Planning: Goal: Ability to manage health-related needs will improve Outcome: Progressing

## 2021-02-09 NOTE — Plan of Care (Signed)
  Problem: Health Behavior/Discharge Planning: Goal: Ability to manage health-related needs will improve Outcome: Progressing   Problem: Clinical Measurements: Goal: Ability to maintain clinical measurements within normal limits will improve Outcome: Progressing   Problem: Nutrition: Goal: Adequate nutrition will be maintained Outcome: Progressing   Problem: Coping: Goal: Level of anxiety will decrease Outcome: Progressing   Problem: Pain Managment: Goal: General experience of comfort will improve Outcome: Progressing   Problem: Safety: Goal: Ability to remain free from injury will improve Outcome: Progressing

## 2021-02-10 ENCOUNTER — Encounter: Payer: Self-pay | Admitting: Family Medicine

## 2021-02-10 DIAGNOSIS — E871 Hypo-osmolality and hyponatremia: Secondary | ICD-10-CM | POA: Diagnosis not present

## 2021-02-10 MED ORDER — ITRACONAZOLE 100 MG PO CAPS: 100.0000 mg | ORAL_CAPSULE | Freq: Once | ORAL | 0 refills | Status: AC

## 2021-02-10 MED ORDER — FLUCONAZOLE 100 MG PO TABS: 100.0000 mg | ORAL_TABLET | Freq: Every day | ORAL | 0 refills | Status: DC

## 2021-02-10 NOTE — Progress Notes (Signed)
Physical Therapy Treatment Patient Details Name: Cameron Rogers MRN: [id number] DOB: 12-10-1965 Today's Date: 02/10/2021    History of Present Illness Pt is a 55 y.o. male admitted 02/05/21 with nausea, vomiting, diarrhea, but still able to consume alcohol; workup for hypovolemic hyponatremia, suspected candidiasis of L groin. PMH includes ETOH use, HTN, anemia, arthritis, COVID-19 (02/2020).    PT Comments    Pt received seated EOB and agreeable to therapy session and with good participation for hallway gait training. He was without LOB for gait trial without DME but is more steady and confident with U UE support therefore continue to recommend cane upon discharge and he was receptive to this idea. Pt continues to benefit from PT services to progress toward functional mobility goals. DME recommendations updated below per discussion with pt and supervising PT Jaclyn R.   Follow Up Recommendations  No PT follow up;Supervision for mobility/OOB     Equipment Recommendations  Cane;3in1 (PT)    Recommendations for Other Services       Precautions / Restrictions Precautions Precautions: Fall Precaution Comments: CIWA; urinary incontinence Restrictions Weight Bearing Restrictions: No    Mobility  Bed Mobility Overal bed mobility: Modified Independent   General bed mobility comments: received seated EOB    Transfers Overall transfer level: Modified independent Equipment used: None   Sit to Stand: Modified independent (Device/Increase time)         General transfer comment: demonstrated ability to stand from bed unassisted x2  Ambulation/Gait Ambulation/Gait assistance: Min guard;Supervision Gait Distance (Feet): 200 Feet Assistive device: None Gait Pattern/deviations: Step-to pattern;Narrow base of support;Decreased dorsiflexion - right;Decreased dorsiflexion - left (decreased heel-toe pattern) Gait velocity: Decreased Gait velocity interpretation: <1.31 ft/sec, indicative  of household ambulator General Gait Details: fair balance without cane but he would benefit as he tends to reach for hallway handrail; HR max 112 bpm; SpO2 98% on RA      Balance Overall balance assessment: Needs assistance   Sitting balance-Leahy Scale: Good     Standing balance support: During functional activity Standing balance-Leahy Scale: Good Standing balance comment: walking about room without LOB but at times reaches for handrail in hallway; min guard to supervision for safety        Cognition Arousal/Alertness: Awake/alert Behavior During Therapy: WFL for tasks assessed/performed Overall Cognitive Status: Impaired/Different from baseline Area of Impairment: Memory;Problem solving   Memory: Decreased short-term memory Following Commands: Follows one step commands consistently Safety/Judgement: Decreased awareness of safety   Problem Solving: Requires verbal cues General Comments: pt received with gown doffed but agreeable to therapy session, some impulsivity but will follow cues to stop and wait      Exercises      General Comments General comments (skin integrity, edema, etc.): BP 141/84 (99) pre mobility; BP 154/95 (111) post-exertion; RR 21 rpm; HR 77 bpm resting and to 112 bpm with exertion      Pertinent Vitals/Pain Pain Assessment: No/denies pain           PT Goals (current goals can now be found in the care plan section) Acute Rehab PT Goals Patient Stated Goal: go home PT Goal Formulation: With patient Time For Goal Achievement: 02/21/21 Potential to Achieve Goals: Fair Progress towards PT goals: Progressing toward goals    Frequency    Min 3X/week      PT Plan Equipment recommendations need to be updated       AM-PAC PT "6 Clicks" Mobility   Outcome Measure  Help needed turning from your  back to your side while in a flat bed without using bedrails?: None Help needed moving from lying on your back to sitting on the side of a flat  bed without using bedrails?: None Help needed moving to and from a bed to a chair (including a wheelchair)?: None Help needed standing up from a chair using your arms (e.g., wheelchair or bedside chair)?: None Help needed to walk in hospital room?: A Little Help needed climbing 3-5 steps with a railing? : A Little 6 Click Score: 22    End of Session Equipment Utilized During Treatment: Gait belt Activity Tolerance: Patient tolerated treatment well Patient left: in bed;with call bell/phone within reach;with nursing/sitter in room (RN in room) Nurse Communication: Mobility status;Other (comment) (DME needed) PT Visit Diagnosis: Other abnormalities of gait and mobility (R26.89);Unsteadiness on feet (R26.81)     Time: 9702-6378 PT Time Calculation (min) (ACUTE ONLY): 12 min  Charges:  $Gait Training: 8-22 mins                     Mane Consolo P., PTA Acute Rehabilitation Services Pager: [phone] Office: [phone]   Angus Palms 02/10/2021, 2:47 PM

## 2021-02-10 NOTE — Progress Notes (Signed)
Occupational Therapy Treatment Patient Details Name: Cameron Rogers MRN: [id number] DOB: [birth year] Today's Date: 02/10/2021    History of present illness Pt is a 55 y.o. male admitted 02/05/21 with nausea, vomiting, diarrhea, but still able to consume alcohol; workup for hypovolemic hyponatremia, suspected candidiasis of L groin. PMH includes ETOH use, HTN, anemia, arthritis, COVID-19 (02/2020).   OT comments  Pt walking about room and bathroom without LOB upon OTs arrival and demonstrating ability to perform LB dressing, toileting and grooming modified independently. Eager to discharge today. Pt continues to demonstrate impaired cognition, memory and problem solving. Became tearful with subject of needing to stop drinking alcohol brought up.  Follow Up Recommendations  No OT follow up    Equipment Recommendations  None recommended by OT    Recommendations for Other Services      Precautions / Restrictions         Mobility Bed Mobility Overal bed mobility: Modified Independent             General bed mobility comments: HOB up    Transfers Overall transfer level: Modified independent Equipment used: None             General transfer comment: demonstrated ability to stand from bed and toilet without assist    Balance Overall balance assessment: Needs assistance   Sitting balance-Leahy Scale: Good     Standing balance support: During functional activity Standing balance-Leahy Scale: Good Standing balance comment: walking about room without LOB                           ADL either performed or assessed with clinical judgement   ADL Overall ADL's : Needs assistance/impaired Eating/Feeding: Independent   Grooming: Modified independent;Standing           Upper Body Dressing : Set up;Sitting   Lower Body Dressing: Set up;Sit to/from stand   Toilet Transfer: Modified Independent;Ambulation   Toileting- Clothing Manipulation and Hygiene:  Modified independent       Functional mobility during ADLs: Modified independent       Vision       Perception     Praxis      Cognition Arousal/Alertness: Awake/alert Behavior During Therapy: WFL for tasks assessed/performed (tearful at times) Overall Cognitive Status: Impaired/Different from baseline Area of Impairment: Memory;Problem solving                     Memory: Decreased short-term memory       Problem Solving: Requires verbal cues General Comments: pt told he could go home just prior to OTs arrival, began disrobing and removing telemetry without any clothing at the hospital        Exercises     Shoulder Instructions       General Comments      Pertinent Vitals/ Pain       Pain Assessment: No/denies pain  Home Living                                          Prior Functioning/Environment              Frequency  Min 2X/week        Progress Toward Goals  OT Goals(current goals can now be found in the care plan section)  Progress towards OT goals: Progressing toward goals  Acute Rehab  OT Goals Patient Stated Goal: go home OT Goal Formulation: With patient Time For Goal Achievement: 02/22/21 Potential to Achieve Goals: Good  Plan Discharge plan remains appropriate    Co-evaluation                 AM-PAC OT "6 Clicks" Daily Activity     Outcome Measure   Help from another person eating meals?: None Help from another person taking care of personal grooming?: None Help from another person toileting, which includes using toliet, bedpan, or urinal?: None Help from another person bathing (including washing, rinsing, drying)?: None Help from another person to put on and taking off regular upper body clothing?: None Help from another person to put on and taking off regular lower body clothing?: None 6 Click Score: 24    End of Session    OT Visit Diagnosis: Other symptoms and signs involving  cognitive function   Activity Tolerance Patient tolerated treatment well   Patient Left in bed;with call bell/phone within reach   Nurse Communication          Time: 1345-1401 OT Time Calculation (min): 16 min  Charges: OT General Charges $OT Visit: 1 Visit OT Treatments $Self Care/Home Management : 8-22 mins  Cameron Rogers, OTR/L Acute Rehabilitation Services Pager: [phone] Office: [phone]   Evern Bio 02/10/2021, 2:28 PM

## 2021-02-10 NOTE — Progress Notes (Signed)
Pt. declined 3in 1 and cane since he is going to pay for his co-pay.CM made aware. Discharged home accompanied by nephew.

## 2021-02-10 NOTE — Hospital Course (Signed)
52 black male community dwelling HTN, significant EtOH with alcoholic hepatitis in the past Prior COVID-19 02/2020 Admit 02/06/2021 severe nausea vomiting-sodium 117, suspected Candida left groin  Had epistaxis bloody stool hematuria and has lost 50 pounds in several months   Data from 4/26 Sodium 132 BUN/creatinine 5/0.8 AST/ALT 109/81

## 2021-02-10 NOTE — TOC Transition Note (Signed)
Transition of Care [facility]) - CM/SW Discharge Note   Patient Details  Name: Cameron Rogers MRN: [id number] Date of Birth: 17-Sep-1966  Transition of Care [facility] CM/SW Contact:  Leone Haven, RN Phone Number: 02/10/2021, 2:57 PM   Clinical Narrative:    Patient is for dc today, he will need a cane and a 3 n 1, Patient ok with [employer] to supply.  [employer] will deliver to room prior to dc.   Final next level of care: Home/Self Care Barriers to Discharge: No Barriers Identified   Patient Goals and CMS Choice Patient states their goals for this hospitalization and ongoing recovery are:: return home      Discharge Placement                       Discharge Plan and Services                DME Arranged: 3-N-1,Cane DME Agency: [employer] Date DME Agency Contacted: 02/10/21 Time DME Agency Contacted: [phone] Representative spoke with at DME Agency: Silvio Pate HH Arranged: NA          Social Determinants of Health (SDOH) Interventions     Readmission Risk Interventions No flowsheet data found.

## 2021-02-10 NOTE — Discharge Summary (Addendum)
Physician Discharge Summary  Cameron Rogers JGG:[id number] DOB: 10-24-1965 DOA: 02/05/2021  PCP: [name], DO  Admit date: 02/05/2021 Discharge date: 02/10/2021  Time spent: 36 minutes  Recommendations for Outpatient Follow-up:  1. Recommend outpatient resources to be given to him by [facility] regarding alcohol cessation classes-his nephew should be able to transport him to the classes 2. Recommend LFTs HIV RPR in the outpatient setting 3. Patient should be counseled regarding blood pressure and should be reevaluated in about 1 month and be placed on alternate to thiazide, ACE inhibitor 4. He will need an A1c in the outpatient setting 5. I would consider getting a PSA after shared discussion with him in the outpatient setting 6. He was discharged on p.o. Diflucan for groin cellulitis and should complete the tablets and I have called him into his pharmacy   Discharge Diagnoses:  MAIN problem for hospitalization   [place] hyponatremia alcoholism  Please see below for itemized issues addressed in HOpsital- refer to other progress notes for clarity if needed  Discharge Condition: Improved  Diet recommendation: Regular  Filed Weights   02/05/21 1751 02/08/21 1106 02/09/21 0300  Weight: 73.6 kg 72.2 kg 75.4 kg    History of present illness:  49 black male community dwelling HTN, significant EtOH with alcoholic hepatitis in the past Prior COVID-19 02/2020 Admit 02/06/2021 severe nausea vomiting-sodium 117, suspected Candida left groin  Had epistaxis bloody stool hematuria and has lost 50 pounds in several months  Hospital Course:  Patient was admitted and found to have a low sodium of 117 and also felt to be secondary to his thiazide diuretic in addition to poor p.o. intake Patient was given IV fluid and repleted with electrolytes during hospital stay He stated different things to different providers-he told me on discharge that he was last drinking heavily 7 days  prior He also was found to have slightly elevated PSA of 4.8 and an A1c in 2020 of 5.9 He lost 13 kg since his last hospitalization but this is probably secondary to his chronic ethanolism  I had a long discussion with him about continuous cessation counseling-he became tearful after therapy worked with him and seems to want to quit I have reached out to [facility] to ensure that he can get the resources to quit  Of note his blood pressure medications were discontinued because HCTZ has an effect on sodium and could have caused the potomania  He will complete Diflucan for groin cellulitis in the outpatient setting  He was stabilized during hospital stay was eating and drinking well and did not have any nausea or other issues that were concerning for upper GI issues He knows that he needs to quit alcohol   discharge Exam: Vitals:   02/10/21 0800 02/10/21 1200  BP:    Pulse:    Resp:    Temp:    SpO2: 94% 94%    Subj on day of d/c   Awake alert coherent no distress He is pleasant   General Exam on discharge  EOMI NCAT no focal deficit somewhat disheveled No icterus no pallor CTA B no added sound no rales no rhonchi Mild epigastric tenderness No lower extremity edema ROM intact  Discharge Instructions   Discharge Instructions    Consult to Transition of Care Team   Complete by: As directed    Needs ETOH counselling and resources to help him quit     Allergies as of 02/10/2021   No Known Allergies     Medication  List    STOP taking these medications   losartan-hydrochlorothiazide 50-12.5 MG tablet Commonly known as: HYZAAR     TAKE these medications   gabapentin 300 MG capsule Commonly known as: NEURONTIN Take 1 capsule (300 mg total) by mouth at bedtime.   itraconazole 100 MG capsule Commonly known as: SPORANOX Take 1 capsule (100 mg total) by mouth once for 1 dose. Start taking on: Feb 14, 2021      No Known Allergies    The results of significant  diagnostics from this hospitalization (including imaging, microbiology, ancillary and laboratory) are listed below for reference.    Significant Diagnostic Studies: CT HEAD WO CONTRAST  Result Date: 01/20/2021 CLINICAL DATA:  Vertigo EXAM: CT HEAD WITHOUT CONTRAST TECHNIQUE: Contiguous axial images were obtained from the base of the skull through the vertex without intravenous contrast. COMPARISON:  None. FINDINGS: Brain: No evidence of acute infarction, hemorrhage, hydrocephalus, extra-axial collection or mass lesion/mass effect. Cerebral cortical volume loss slightly advanced for age. Vascular: No hyperdense vessel or unexpected calcification. Skull: Normal. Negative for fracture or focal lesion. Sinuses/Orbits: No acute finding. Other: None. IMPRESSION: 1. No acute intracranial abnormality. 2. Age advanced cerebral cortical volume loss. Electronically Signed   By: Malachy Moan M.D.   On: 01/20/2021 11:24   CT Abdomen Pelvis W Contrast  Result Date: 02/05/2021 CLINICAL DATA:  Diverticulitis suspected. Nausea, vomiting, diarrhea for 5-6 days. Back pain and weakness. EXAM: CT ABDOMEN AND PELVIS WITH CONTRAST TECHNIQUE: Multidetector CT imaging of the abdomen and pelvis was performed using the standard protocol following bolus administration of intravenous contrast. CONTRAST:  OMNIPAQUE IOHEXOL 300 MG/ML  SOLN COMPARISON:  Right upper quadrant ultrasound 02/26/2020 FINDINGS: Lower chest: Lung bases are clear. No acute airspace disease or pleural fluid. Hepatobiliary: Diffusely decreased hepatic density typical of steatosis. No focal hepatic abnormality. Gallbladder physiologically distended, no calcified stone. No biliary dilatation. Pancreas: No ductal dilatation or inflammation. Spleen: Normal in size without focal abnormality. Adrenals/Urinary Tract: Normal adrenal glands. No hydronephrosis or perinephric edema. Homogeneous renal enhancement with symmetric excretion on delayed phase imaging. No  visualized renal calculi or focal renal lesion. Urinary bladder is physiologically distended without wall thickening. Stomach/Bowel: Decompressed stomach. Possible gastric wall thickening not well assessed due to nondistention. Normal positioning of the duodenum and ligament of Treitz. There is no small bowel obstruction or inflammatory change. Normal appendix. Minimal diverticulosis primarily involving the right colon. No diverticulitis. Majority of the colon is decompressed which limits detailed assessment. No obvious colonic wall thickening. No pericolonic edema Vascular/Lymphatic: Mild aortic atherosclerosis. No aortic aneurysm. Patent portal and mesenteric veins. No enlarged lymph nodes in the abdomen or pelvis. Reproductive: Mildly enlarged prostate gland. Other: No free air, free fluid, or intra-abdominal fluid collection. Musculoskeletal: There are no acute or suspicious osseous abnormalities. Facet hypertrophy in the lower lumbar spine. IMPRESSION: 1. Minimal right colonic diverticulosis without diverticulitis. 2. Possible gastric wall thickening versus nondistention, suggesting gastritis or peptic ulcer disease. 3. Mild hepatic steatosis. 4. Mildly enlarged prostate gland. Aortic Atherosclerosis (ICD10-I70.0). Electronically Signed   By: Narda Rutherford M.D.   On: 02/05/2021 19:47    Microbiology: Recent Results (from the past 240 hour(s))  Resp Panel by RT-PCR (Flu A&B, Covid) Nasopharyngeal Swab     Status: None   Collection Time: 02/05/21  7:01 PM   Specimen: Nasopharyngeal Swab; Nasopharyngeal(NP) swabs in vial transport medium  Result Value Ref Range Status   SARS Coronavirus 2 by RT PCR NEGATIVE NEGATIVE Final    Comment: (  NOTE) SARS-CoV-2 target nucleic acids are NOT DETECTED.  The SARS-CoV-2 RNA is generally detectable in upper respiratory specimens during the acute phase of infection. The lowest concentration of SARS-CoV-2 viral copies this assay can detect is 138 copies/mL. A  negative result does not preclude SARS-Cov-2 infection and should not be used as the sole basis for treatment or other patient management decisions. A negative result may occur with  improper specimen collection/handling, submission of specimen other than nasopharyngeal swab, presence of viral mutation(s) within the areas targeted by this assay, and inadequate number of viral copies(<138 copies/mL). A negative result must be combined with clinical observations, patient history, and epidemiological information. The expected result is Negative.  Fact Sheet for Patients:  BloggerCourse.com  Fact Sheet for Healthcare Providers:  SeriousBroker.it  This test is no t yet approved or cleared by the [place] FDA and  has been authorized for detection and/or diagnosis of SARS-CoV-2 by FDA under an Emergency Use Authorization (EUA). This EUA will remain  in effect (meaning this test can be used) for the duration of the COVID-19 declaration under Section 564(b)(1) of the Act, 21 U.S.C.section 360bbb-3(b)(1), unless the authorization is terminated  or revoked sooner.       Influenza A by PCR NEGATIVE NEGATIVE Final   Influenza B by PCR NEGATIVE NEGATIVE Final    Comment: (NOTE) The Xpert Xpress SARS-CoV-2/FLU/RSV plus assay is intended as an aid in the diagnosis of influenza from Nasopharyngeal swab specimens and should not be used as a sole basis for treatment. Nasal washings and aspirates are unacceptable for Xpert Xpress SARS-CoV-2/FLU/RSV testing.  Fact Sheet for Patients: BloggerCourse.com  Fact Sheet for Healthcare Providers: SeriousBroker.it  This test is not yet approved or cleared by the [place] FDA and has been authorized for detection and/or diagnosis of SARS-CoV-2 by FDA under an Emergency Use Authorization (EUA). This EUA will remain in effect (meaning this test can  be used) for the duration of the COVID-19 declaration under Section 564(b)(1) of the Act, 21 U.S.C. section 360bbb-3(b)(1), unless the authorization is terminated or revoked.  Performed at [facility]   MRSA PCR Screening     Status: None   Collection Time: 02/06/21 10:52 AM   Specimen: Nasal Mucosa; Nasopharyngeal  Result Value Ref Range Status   MRSA by PCR NEGATIVE NEGATIVE Final    Comment:        The GeneXpert MRSA Assay (FDA approved for NASAL specimens only), is one component of a comprehensive MRSA colonization surveillance program. It is not intended to diagnose MRSA infection nor to guide or monitor treatment for MRSA infections. Performed at [facility], [address], [place], [place] [address]      Labs: Basic Metabolic Panel: Recent Labs  Lab 02/06/21 1409 02/06/21 2047 02/07/21 0016 02/07/21 0426 02/07/21 0953 02/08/21 0032 02/09/21 0049  NA 120*   < > 122* 121* 128* 129* 132*  K 3.7   < > 3.5 3.7 3.5 3.9 3.9  CL 87*   < > 86* 88* 90* 94* 94*  CO2 19*   < > 23 23 29 28 29   GLUCOSE 118*   < > 133* 116* 100* 99 134*  BUN 9   < > <5* <5* <5* <5* 5*  CREATININE 1.07   < > 0.87 0.84 0.79 0.82 0.88  CALCIUM 8.7*   < > 8.9 9.2 9.3 9.2 9.2  MG 1.9  --   --   --   --   --   --  PHOS 2.0*  --   --   --   --   --   --    < > = values in this interval not displayed.   Liver Function Tests: Recent Labs  Lab 02/05/21 1800 02/06/21 1409 02/09/21 0049  AST 175* 214* 109*  ALT 105* 104* 81*  ALKPHOS 368* 327* 303*  BILITOT 4.1* 3.8* 1.3*  PROT 7.3 6.6 5.8*  ALBUMIN 3.8 2.9* 2.6*   Recent Labs  Lab 02/05/21 1800  LIPASE 58*   Recent Labs  Lab 02/07/21 0953 02/09/21 0049  AMMONIA 37* 24   CBC: Recent Labs  Lab 02/05/21 1800 02/06/21 1409 02/07/21 0426 02/08/21 0032 02/09/21 0049  WBC 8.8 6.4 5.6 5.4 6.8  NEUTROABS 6.0  --   --   --   --   HGB 13.5 12.2* 11.5* 10.5* 10.6*  HCT 35.9* 33.7* 31.9* 29.3* 30.6*  MCV  83.5 86.4 86.9 88.8 91.3  PLT 139* 122* 124* 126* 154   Cardiac Enzymes: No results for input(s): CKTOTAL, CKMB, CKMBINDEX, TROPONINI in the last 168 hours. BNP: BNP (last 3 results) Recent Labs    02/27/20 0616 02/28/20 0316 02/29/20 0814  BNP 278.1* 167.1* 198.2*    ProBNP (last 3 results) No results for input(s): PROBNP in the last 8760 hours.  CBG: Recent Labs  Lab 02/05/21 1753  GLUCAP 94       Signed:  Rhetta MuraJai-Gurmukh Maydell Knoebel MD   [facility] 02/10/2021, 2:11 PM

## 2021-02-11 ENCOUNTER — Telehealth: Payer: Self-pay

## 2021-02-11 NOTE — Telephone Encounter (Signed)
Transition Care Management Unsuccessful Follow-up Telephone Call  Date of discharge and from where:  02/10/2021, [facility]   Attempts:  1st Attempt  Reason for unsuccessful TCM follow-up call:  Left voice message on # [phone].   Patient has appointment with Dr Earlene Plater at [facility] 02/23/2021

## 2021-02-12 ENCOUNTER — Telehealth: Payer: Self-pay

## 2021-02-12 NOTE — Telephone Encounter (Signed)
Transition Care Management Unsuccessful Follow-up Telephone Call  Date of discharge and from where:  02/10/2021, [facility]  How have you been since you were released from the hospital? feeling good  Any questions or concerns? Pt wanted to know when can he resume to take his BP meds? Please advice. BP today pt stated was 144/81  Items Reviewed:  Did the pt receive and understand the discharge instructions provided? have the instructions and have no questions.  Medications obtained and verified? said that they have the medication list  and the hospital staff reviewed them in detail prior to discharge.  Any new allergies since your discharge? None reported  Do you have support at home? Yes, nephew Other (ie: DME, Home Health, etc)     None given / Pt stated he will by a cain from [employer]   Functional Questionnaire: (I = Independent and D = Dependent) ADL's:  Independent.      Follow up appointments reviewed:   PCP Hospital f/u appt confirmed?Dr Earlene Plater on 02/23/21 .  Specialist Hospital f/u appt confirmed? None scheduled at this time  Are transportation arrangements needed? have transportation   If their condition worsens, is the pt aware to call  their PCP or go to the ED? Yes.Made pt aware if condition worsen or start experiencing rapid weight gain, chest pain, diff breathing, SOB, high fevers, or bleading to refer imediately to ED for further evaluation.  Was the patient provided with contact information for the PCP's office or ED? He has the phone number  Was the pt encouraged to call back with questions or concerns?yes

## 2021-02-19 LAB — FPSA% REFLEX: % FREE PSA: UNDETERMINED %

## 2021-02-23 ENCOUNTER — Encounter: Payer: Self-pay | Admitting: Internal Medicine

## 2021-02-23 ENCOUNTER — Other Ambulatory Visit: Payer: Self-pay

## 2021-02-23 ENCOUNTER — Ambulatory Visit (INDEPENDENT_AMBULATORY_CARE_PROVIDER_SITE_OTHER): Payer: BC Managed Care – PPO | Admitting: Internal Medicine

## 2021-02-23 VITALS — BP 149/102 | HR 113 | Temp 97.5°F | Resp 20 | Ht 74.0 in | Wt 156.0 lb

## 2021-02-23 DIAGNOSIS — Z87898 Personal history of other specified conditions: Secondary | ICD-10-CM

## 2021-02-23 DIAGNOSIS — R7303 Prediabetes: Secondary | ICD-10-CM | POA: Diagnosis not present

## 2021-02-23 DIAGNOSIS — Z09 Encounter for follow-up examination after completed treatment for conditions other than malignant neoplasm: Secondary | ICD-10-CM

## 2021-02-23 DIAGNOSIS — R7989 Other specified abnormal findings of blood chemistry: Secondary | ICD-10-CM

## 2021-02-23 DIAGNOSIS — I10 Essential (primary) hypertension: Secondary | ICD-10-CM

## 2021-02-23 DIAGNOSIS — E871 Hypo-osmolality and hyponatremia: Secondary | ICD-10-CM

## 2021-02-23 DIAGNOSIS — F1010 Alcohol abuse, uncomplicated: Secondary | ICD-10-CM | POA: Diagnosis not present

## 2021-02-23 DIAGNOSIS — R634 Abnormal weight loss: Secondary | ICD-10-CM

## 2021-02-23 DIAGNOSIS — Z1211 Encounter for screening for malignant neoplasm of colon: Secondary | ICD-10-CM

## 2021-02-23 MED ORDER — NALTREXONE HCL 50 MG PO TABS: 50.0000 mg | ORAL_TABLET | Freq: Every day | ORAL | 2 refills | Status: DC

## 2021-02-23 MED ORDER — AMLODIPINE BESYLATE 10 MG PO TABS: 10.0000 mg | ORAL_TABLET | Freq: Every day | ORAL | 1 refills | Status: DC

## 2021-02-23 NOTE — Progress Notes (Signed)
healt issues- Weight loss Unable to eat, anorexia, nausea Worrying   Start BP medications Wants to stop drinking Tremors Feels like blood sugar drops at times, craves candy

## 2021-02-23 NOTE — Progress Notes (Signed)
Subjective:    Cameron Rogers - 55 y.o. male MRN [id number]  Date of birth: 30-Apr-1966  HPI  Cameron Rogers is here for hospital discharge follow up. Was hospitalized from 4/22-4/27. Patient had potomania hyponatremia alcoholism.     Recommendations for Outpatient Follow-up:  1. Recommend outpatient resources to be given to him by [facility] regarding alcohol cessation classes-his nephew should be able to transport him to the classes 2. Recommend LFTs HIV RPR in the outpatient setting 3. Patient should be counseled regarding blood pressure and should be reevaluated in about 1 month and be placed on alternate to thiazide, ACE inhibitor 4. He will need an A1c in the outpatient setting 5. I would consider getting a PSA after shared discussion with him in the outpatient setting 6. He was discharged on p.o. Diflucan for groin cellulitis and should complete the tablets and I have called him into his pharmacy    Health Maintenance:  Health Maintenance Due  Topic Date Due  . COLONOSCOPY (Pts 45-72yrs Insurance coverage will need to be confirmed)  Never done  . COVID-19 Vaccine (2 - Pfizer 3-dose series) 09/25/2020    -  reports that he has been smoking cigarettes. He has a 19.00 pack-year smoking history. He has never used smokeless tobacco. - Review of Systems: Per HPI. - Past Medical History: Patient Active Problem List   Diagnosis Date Noted  . Protein-calorie malnutrition, severe 02/08/2021  . Alcohol withdrawal (HCC) 02/06/2021  . Alcohol dependence with uncomplicated withdrawal (HCC) 02/06/2021  . Nausea, vomiting, and diarrhea 02/06/2021  . Groin rash 02/06/2021  . Unexplained weight loss 02/06/2021  . Hyponatremia 02/05/2021  . Acute upper GI bleed 02/26/2020  . COVID-19 virus infection 02/26/2020  . Essential hypertension 11/28/2019  . Neuropathy, alcoholic (HCC) 11/28/2019  . Elevated PSA 11/28/2019  . Elevated liver function tests 11/28/2019  . Prediabetes 11/28/2019  .  Alcohol abuse 11/28/2019   - Medications: reviewed and updated   Objective:   Physical Exam BP (!) 149/102 (BP Location: Right Arm, Patient Position: Sitting, Cuff Size: Large)   Pulse (!) 113   Temp (!) 97.5 F (36.4 C)   Resp 20   Ht 6\' 2"  (1.88 m)   Wt 156 lb (70.8 kg)   SpO2 99%   BMI 20.03 kg/m  Physical Exam Constitutional:      General: He is not in acute distress.    Appearance: He is not diaphoretic.  Cardiovascular:     Rate and Rhythm: Normal rate.  Pulmonary:     Effort: Pulmonary effort is normal. No respiratory distress.  Musculoskeletal:        General: Normal range of motion.  Skin:    General: Skin is warm and dry.  Neurological:     Mental Status: He is alert and oriented to person, place, and time.  Psychiatric:        Mood and Affect: Affect normal.        Judgment: Judgment normal.            Assessment & Plan:  1. Hospital discharge follow-up Reviewed hospital course, current medications, ensured proper f/u in place, and addressed concerns.   2. Essential hypertension BP above goal. Resume Amlodipine, missed dose today.  - amLODipine (NORVASC) 10 MG tablet; Take 1 tablet (10 mg total) by mouth daily.  Dispense: 90 tablet; Refill: 1  3. History of elevated PSA Discussed with patient that had elevated PSA 4.8 in July 2020 and did not establish with urology. Recommended  repeating today and if elevated will need referral.  - PSA  4. Alcohol abuse Patient is interested in medication assistance to help quit alcohol. Will start Naltrexone. He does not want resources about rehab or support groups.  - Comprehensive metabolic panel - naltrexone (DEPADE) 50 MG tablet; Take 1 tablet (50 mg total) by mouth daily.  Dispense: 30 tablet; Refill: 2  5. Prediabetes Last A1c 5.9 in July 2020. Monitor.  - Hemoglobin A1c  6. Hyponatremia Na 132 prior to discharge. Had improved from 117 from admit. Would not be surprised if has some degree of chronic  hyponatremia from alcohol.  - Comprehensive metabolic panel  7. Elevated LFTs AST 109 and ALT 81 at discharge. 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  - Comprehensive metabolic panel  8. Loss of weight Reports chronic loss of weight and change in appetite. May be related to poor nutrition from chronic alcohol use. Needs updated colon cancer screening as below. Also obtaining PSA.    9. Colon cancer screening - Ambulatory referral to Gastroenterology    Marcy Siren, D.O. 02/23/2021, 3:08 PM Primary Care at [facility] ([facility])

## 2021-02-24 LAB — HEMOGLOBIN A1C
Est. average glucose Bld gHb Est-mCnc: 114 mg/dL
Hgb A1c MFr Bld: 5.6 % (ref 4.8–5.6)

## 2021-02-24 LAB — COMPREHENSIVE METABOLIC PANEL
ALT: 110 IU/L — ABNORMAL HIGH (ref 0–44)
AST: 191 IU/L — ABNORMAL HIGH (ref 0–40)
Albumin/Globulin Ratio: 1.3 (ref 1.2–2.2)
Albumin: 4.1 g/dL (ref 3.8–4.9)
Alkaline Phosphatase: 306 IU/L — ABNORMAL HIGH (ref 44–121)
BUN/Creatinine Ratio: 9 (ref 9–20)
BUN: 7 mg/dL (ref 6–24)
Bilirubin Total: 1.5 mg/dL — ABNORMAL HIGH (ref 0.0–1.2)
CO2: 23 mmol/L (ref 20–29)
Calcium: 9.5 mg/dL (ref 8.7–10.2)
Chloride: 96 mmol/L (ref 96–106)
Creatinine, Ser: 0.81 mg/dL (ref 0.76–1.27)
Globulin, Total: 3.1 g/dL (ref 1.5–4.5)
Glucose: 96 mg/dL (ref 65–99)
Potassium: 4.6 mmol/L (ref 3.5–5.2)
Sodium: 135 mmol/L (ref 134–144)
Total Protein: 7.2 g/dL (ref 6.0–8.5)
eGFR: 105 mL/min/{1.73_m2} (ref >59)

## 2021-02-24 LAB — PSA: Prostate Specific Ag, Serum: 7.2 ng/mL — ABNORMAL HIGH (ref 0.0–4.0)

## 2021-03-02 ENCOUNTER — Other Ambulatory Visit: Payer: Self-pay | Admitting: Internal Medicine

## 2021-03-02 DIAGNOSIS — R972 Elevated prostate specific antigen [PSA]: Secondary | ICD-10-CM

## 2021-03-04 ENCOUNTER — Telehealth (INDEPENDENT_AMBULATORY_CARE_PROVIDER_SITE_OTHER): Payer: Self-pay

## 2021-03-04 NOTE — Telephone Encounter (Signed)
Patient verified date of birth. He is aware of elevated PSA and urology referral being placed. Informed patient referral was sent to [facility]. Attempted to provide patient with the phone number. He stated he would just wait for them to contact him. He verbalized understanding of results. Maryjean Morn, CMA

## 2021-03-04 NOTE — Telephone Encounter (Signed)
-----   Message from [name], DO sent at 03/02/2021 11:59 AM EDT ----- Please call [name] about results. PSA is quite elevated at 7.2---needs to be seen by urology. Very important he gets scheduled, referral has been placed. A1c is well controlled at 5.6. CMET shows that he has elevated liver enzymes consistent with findings during hospitalization. Needs to work on quitting alcohol.   Thanks,  Marcy Siren, D.O. Primary Care at [facility]  03/02/2021, 11:57 AM

## 2021-04-27 ENCOUNTER — Ambulatory Visit: Payer: BC Managed Care – PPO | Admitting: Gastroenterology

## 2021-07-12 ENCOUNTER — Emergency Department (HOSPITAL_COMMUNITY)
Admission: EM | Admit: 2021-07-12 | Discharge: 2021-07-12 | Disposition: A | Discharge status: Home / Self Care | Payer: BC Managed Care – PPO | Attending: Student | Admitting: Student

## 2021-07-12 ENCOUNTER — Other Ambulatory Visit: Payer: Self-pay

## 2021-07-12 DIAGNOSIS — L700 Acne vulgaris: Secondary | ICD-10-CM | POA: Insufficient documentation

## 2021-07-12 DIAGNOSIS — Z79899 Other long term (current) drug therapy: Secondary | ICD-10-CM | POA: Insufficient documentation

## 2021-07-12 DIAGNOSIS — I10 Essential (primary) hypertension: Secondary | ICD-10-CM | POA: Insufficient documentation

## 2021-07-12 DIAGNOSIS — Z8616 Personal history of COVID-19: Secondary | ICD-10-CM | POA: Insufficient documentation

## 2021-07-12 DIAGNOSIS — R040 Epistaxis: Secondary | ICD-10-CM | POA: Insufficient documentation

## 2021-07-12 DIAGNOSIS — F17210 Nicotine dependence, cigarettes, uncomplicated: Secondary | ICD-10-CM | POA: Insufficient documentation

## 2021-07-12 LAB — PROTIME-INR
INR: 1 (ref 0.8–1.2)
Prothrombin Time: 13.3 seconds (ref 11.4–15.2)

## 2021-07-12 LAB — COMPREHENSIVE METABOLIC PANEL
ALT: 56 U/L — ABNORMAL HIGH (ref 0–44)
AST: 160 U/L — ABNORMAL HIGH (ref 15–41)
Albumin: 4.1 g/dL (ref 3.5–5.0)
Alkaline Phosphatase: 102 U/L (ref 38–126)
Anion gap: 16 — ABNORMAL HIGH (ref 5–15)
BUN: 7 mg/dL (ref 6–20)
CO2: 17 mmol/L — ABNORMAL LOW (ref 22–32)
Calcium: 9.1 mg/dL (ref 8.9–10.3)
Chloride: 102 mmol/L (ref 98–111)
Creatinine, Ser: 0.8 mg/dL (ref 0.61–1.24)
GFR, Estimated: >60 mL/min (ref >60)
Glucose, Bld: 131 mg/dL — ABNORMAL HIGH (ref 70–99)
Potassium: 3.7 mmol/L (ref 3.5–5.1)
Sodium: 135 mmol/L (ref 135–145)
Total Bilirubin: 0.8 mg/dL (ref 0.3–1.2)
Total Protein: 7.4 g/dL (ref 6.5–8.1)

## 2021-07-12 LAB — CBC WITH DIFFERENTIAL/PLATELET
Abs Immature Granulocytes: 0.02 10*3/uL (ref 0.00–0.07)
Basophils Absolute: 0 10*3/uL (ref 0.0–0.1)
Basophils Relative: 1 %
Eosinophils Absolute: 0 10*3/uL (ref 0.0–0.5)
Eosinophils Relative: 0 %
HCT: 44.1 % (ref 39.0–52.0)
Hemoglobin: 14.8 g/dL (ref 13.0–17.0)
Immature Granulocytes: 0 %
Lymphocytes Relative: 35 %
Lymphs Abs: 2.1 10*3/uL (ref 0.7–4.0)
MCH: 28.8 pg (ref 26.0–34.0)
MCHC: 33.6 g/dL (ref 30.0–36.0)
MCV: 85.8 fL (ref 80.0–100.0)
Monocytes Absolute: 0.5 10*3/uL (ref 0.1–1.0)
Monocytes Relative: 8 %
Neutro Abs: 3.4 10*3/uL (ref 1.7–7.7)
Neutrophils Relative %: 56 %
Platelets: 184 10*3/uL (ref 150–400)
RBC: 5.14 MIL/uL (ref 4.22–5.81)
RDW: 15.2 % (ref 11.5–15.5)
WBC: 6 10*3/uL (ref 4.0–10.5)
nRBC: 0 % (ref 0.0–0.2)

## 2021-07-12 MED ORDER — DOXYCYCLINE HYCLATE 100 MG PO CAPS: 100.0000 mg | ORAL_CAPSULE | Freq: Two times a day (BID) | ORAL | 0 refills | Status: DC

## 2021-07-12 NOTE — ED Provider Notes (Signed)
[facility] EMERGENCY DEPARTMENT Provider Note   CSN: [id number] Arrival date & time: 07/12/21  1048     History Chief Complaint  Patient presents with   Epistaxis    Cameron Rogers is a 55 y.o. male.  55 year old male with past medical history of hypertension, prediabetes presents with complaint of left-sided nosebleed.  Patient states that he was sitting on the toilet when he began having bleeding from the left side of his nose.  Patient arrived to the emergency room without active bleeding, has not had any active bleeding since this started about 9 hours ago.  He is found to have a pustular rash to his face which he states started when he started his new blood pressure medication, he is using Noxzema face wash.  He is also requesting a free PSA test today, Cameron active complaints, request simply due to age.      Past Medical History:  Diagnosis Date   Alcohol dependence (HCC)    Allergy    SEASONAL   Anemia    Arthritis    Heart murmur    Hypertension    Prediabetes    Unexplained weight loss 02/06/2021    Patient Active Problem List   Diagnosis Date Noted   Protein-calorie malnutrition, severe 02/08/2021   Alcohol withdrawal (HCC) 02/06/2021   Alcohol dependence with uncomplicated withdrawal (HCC) 02/06/2021   Nausea, vomiting, and diarrhea 02/06/2021   Groin rash 02/06/2021   Unexplained weight loss 02/06/2021   Hyponatremia 02/05/2021   Acute upper GI bleed 02/26/2020   COVID-19 virus infection 02/26/2020   Essential hypertension 11/28/2019   Neuropathy, alcoholic (HCC) 11/28/2019   Elevated PSA 11/28/2019   Elevated liver function tests 11/28/2019   Prediabetes 11/28/2019   Alcohol abuse 11/28/2019    Past Surgical History:  Procedure Laterality Date   Cameron PAST SURGERIES         Family History  Problem Relation Age of Onset   Diabetes Mother    Breast cancer Sister    Diabetes Sister    Heart murmur Sister    Diabetes Brother     Colon cancer Neg Hx    Colon polyps Neg Hx    Esophageal cancer Neg Hx    Rectal cancer Neg Hx    Stomach cancer Neg Hx    Ovarian cancer Neg Hx     Social History   Tobacco Use   Smoking status: Every Day    Packs/day: 0.50    Years: 38.00    Pack years: 19.00    Types: Cigarettes   Smokeless tobacco: Never  Vaping Use   Vaping Use: Never used  Substance Use Topics   Alcohol use: Yes    Comment: 3- 40 ounce beers daily;  h/o heavy use   Drug use: Cameron    Home Medications Prior to Admission medications   Medication Sig Start Date End Date Taking? Authorizing Provider  doxycycline (VIBRAMYCIN) 100 MG capsule Take 1 capsule (100 mg total) by mouth 2 (two) times daily. 07/12/21  Yes Jeannie Fend, PA-C  amLODipine (NORVASC) 10 MG tablet Take 1 tablet (10 mg total) by mouth daily. 02/23/21   [name], MD  naltrexone (DEPADE) 50 MG tablet Take 1 tablet (50 mg total) by mouth daily. 02/23/21   [name], MD  hydrochlorothiazide (HYDRODIURIL) 25 MG tablet Take 1 tablet (25 mg total) by mouth daily. 06/02/16 04/02/19  [name], PA-C  omeprazole (PRILOSEC) 20 MG capsule Take 1  capsule (20 mg total) by mouth daily. 04/25/17 04/02/19  [name], MD  promethazine (PHENERGAN) 25 MG tablet Take 1 tablet (25 mg total) by mouth every 8 (eight) hours as needed for nausea or vomiting. 04/25/17 04/02/19  [name], MD    Allergies    Patient has Cameron known allergies.  Review of Systems   Review of Systems  Constitutional:  Negative for chills and fever.  HENT:  Positive for nosebleeds.   Respiratory:  Negative for shortness of breath.   Cardiovascular:  Negative for chest pain.  Skin:  Positive for rash.  Allergic/Immunologic: Negative for immunocompromised state.  Neurological:  Negative for headaches.  Hematological:  Does not bruise/bleed easily.  All other systems reviewed and are negative.  Physical Exam Updated Vital Signs BP (!)  174/106 (BP Location: Right Arm)   Pulse 86   Temp 98.5 F (36.9 C) (Oral)   Resp 18   SpO2 99%   Physical Exam Vitals and nursing note reviewed.  Constitutional:      General: He is not in acute distress.    Appearance: He is well-developed. He is not diaphoretic.  HENT:     Head: Normocephalic and atraumatic.     Nose: Nose normal. Cameron congestion or rhinorrhea.     Right Nostril: Cameron epistaxis or occlusion.     Left Nostril: Cameron epistaxis or occlusion.     Mouth/Throat:     Mouth: Mucous membranes are moist.     Pharynx: Oropharynx is clear. Cameron oropharyngeal exudate or posterior oropharyngeal erythema.  Eyes:     Conjunctiva/sclera: Conjunctivae normal.  Pulmonary:     Effort: Pulmonary effort is normal.  Musculoskeletal:     Cervical back: Normal range of motion and neck supple.  Lymphadenopathy:     Cervical: Cameron cervical adenopathy.  Skin:    General: Skin is warm and dry.     Findings: Rash present.     Comments: Pustular rash to face  Neurological:     Mental Status: He is alert and oriented to person, place, and time.  Psychiatric:        Behavior: Behavior normal.    ED Results / Procedures / Treatments   Labs (all labs ordered are listed, but only abnormal results are displayed) Labs Reviewed  COMPREHENSIVE METABOLIC PANEL - Abnormal; Notable for the following components:      Result Value   CO2 17 (*)    Glucose, Bld 131 (*)    AST 160 (*)    ALT 56 (*)    Anion gap 16 (*)    All other components within normal limits  CBC WITH DIFFERENTIAL/PLATELET  PROTIME-INR    EKG None  Radiology Cameron results found.  Procedures Procedures   Medications Ordered in ED Medications - Cameron data to display  ED Course  I have reviewed the triage vital signs and the nursing notes.  Pertinent labs & imaging results that were available during my care of the patient were reviewed by me and considered in my medical decision making (see chart for details).  Clinical  Course as of 07/12/21 1948  Mon Jul 12, 2021  3752 55 year old male with complaint of epistaxis found to have a normal nose exam, Cameron active bleeding, Cameron bleeding for the past 9 hours.  He is found to have a pustular rash to his face, advised to discontinue the Noxzema, recommend Cetaphil and will treat with course of doxycycline.  Discussed sun precautions.  Labs reviewed, CBC within  normal limits, he is not anemic.  INR normal.  CMP with elevated LFTs, consistent with his alcohol use history.  Normal kidney function, Cameron significant electrolyte derangement.  Patient's vitals are reviewed, his blood pressure is elevated, recommend compliance with his blood pressure medication and recheck with PCP.  O2 sat 100% on room air. Regards to his PSA request, advised this is not a test that is done in the emergency room but he can discuss this with his PCP or health department. [LM]    Clinical Course User Index [LM] Alden Hipp   MDM Rules/Calculators/A&P                            Final Clinical Impression(s) / ED Diagnoses Final diagnoses:  Left-sided epistaxis  Pustular acne  Hypertension, unspecified type    Rx / DC Orders ED Discharge Orders          Ordered    doxycycline (VIBRAMYCIN) 100 MG capsule  2 times daily        07/12/21 1939             Jeannie Fend, PA-C 07/12/21 1948    Ernie Avena, MD 07/13/21 1520

## 2021-07-12 NOTE — ED Provider Notes (Signed)
Emergency Medicine Provider Triage Evaluation Note  Vonte Rossin , a 55 y.o. male  was evaluated in triage.  Pt complains of nosebleed.  Review of Systems  Positive: Nose bleed  Negative: Trauma, nose pain  Physical Exam  There were no vitals taken for this visit. Gen:   Awake, tremulous Resp:  Normal effort  MSK:   Moves extremities without difficulty  Other:  Nose: no active bleeding noted  Medical Decision Making  Medically screening exam initiated at 10:54 AM.  Appropriate orders placed.  Jaekwon Mcclune was informed that the remainder of the evaluation will be completed by another provider, this initial triage assessment does not replace that evaluation, and the importance of remaining in the ED until their evaluation is complete.  Pt at work, felt like gagging and when he does he noticed bleeding from left nares.  It lasted for a few minutes and have resolved.  Pt however report he is concerned.  Not on any blood thinner medication but does have significant alcohol use.    Fayrene Helper, PA-C 07/12/21 1056    [place], [place], MD 07/12/21 [phone]

## 2021-07-12 NOTE — Discharge Instructions (Addendum)
STOP your current face wash. USE Cetaphil (or store brand generic). Take Doxycycline as prescribed to help with your face rash. This medication can make your skin more sun sensitive, use protective clothing, a hat, limit sun exposure.  If your nose bleed returns, use Afrin and pinch the soft part of your nose closed for 15 minutes. Return to the ER if bleeding won't stop.   Recheck with your doctor.  Your doctor should be able to order a PSA (prostate test).  Otherwise, you have been given information for the health department for this testing.

## 2021-07-12 NOTE — ED Triage Notes (Signed)
Pt from work via EMS for eval of nosebleed without injury. Bleeding controlled on arrival to ED, bled only for a couple of minutes per the patient. Hypertensive with hx of same, reports he did take his meds this am. Everyday drinker, does appear tremulous on arrival to ED but reports he is just nervous.

## 2021-07-13 ENCOUNTER — Other Ambulatory Visit: Payer: Self-pay

## 2021-07-13 ENCOUNTER — Encounter: Payer: Self-pay | Admitting: Family Medicine

## 2021-07-13 ENCOUNTER — Ambulatory Visit (INDEPENDENT_AMBULATORY_CARE_PROVIDER_SITE_OTHER): Payer: Self-pay | Admitting: Family Medicine

## 2021-07-13 VITALS — BP 170/112 | HR 88 | Temp 98.2°F | Resp 16 | Ht 74.0 in | Wt 166.4 lb

## 2021-07-13 DIAGNOSIS — Z87898 Personal history of other specified conditions: Secondary | ICD-10-CM

## 2021-07-13 DIAGNOSIS — I10 Essential (primary) hypertension: Secondary | ICD-10-CM

## 2021-07-13 DIAGNOSIS — Z125 Encounter for screening for malignant neoplasm of prostate: Secondary | ICD-10-CM

## 2021-07-13 MED ORDER — HYDROCHLOROTHIAZIDE 25 MG PO TABS: 25.0000 mg | ORAL_TABLET | Freq: Every day | ORAL | 0 refills | Status: DC

## 2021-07-13 MED ORDER — LISINOPRIL 10 MG PO TABS
10.0000 mg | ORAL_TABLET | Freq: Every day | ORAL | 0 refills | Status: DC
Start: 2021-07-13 — End: 2021-07-16

## 2021-07-13 NOTE — Progress Notes (Signed)
Patient is here for BP change, bowl problems(dark and runny), and urine issues (dark )as well.  Patient would like to have his prostate change

## 2021-07-14 ENCOUNTER — Other Ambulatory Visit: Payer: Self-pay | Admitting: Family Medicine

## 2021-07-14 DIAGNOSIS — R972 Elevated prostate specific antigen [PSA]: Secondary | ICD-10-CM

## 2021-07-14 DIAGNOSIS — R39198 Other difficulties with micturition: Secondary | ICD-10-CM

## 2021-07-14 DIAGNOSIS — N3001 Acute cystitis with hematuria: Secondary | ICD-10-CM

## 2021-07-14 LAB — POCT URINALYSIS DIP (CLINITEK)
Glucose, UA: NEGATIVE mg/dL
Nitrite, UA: POSITIVE — AB
POC PROTEIN,UA: 30 — AB
Spec Grav, UA: 1.025 (ref 1.010–1.025)
Urobilinogen, UA: 1 E.U./dL
pH, UA: 5.5 (ref 5.0–8.0)

## 2021-07-14 LAB — PSA: Prostate Specific Ag, Serum: 5.2 ng/mL — ABNORMAL HIGH (ref 0.0–4.0)

## 2021-07-14 MED ORDER — SULFAMETHOXAZOLE-TRIMETHOPRIM 800-160 MG PO TABS
1.0000 | ORAL_TABLET | Freq: Two times a day (BID) | ORAL | 0 refills | Status: DC
Start: 2021-07-14 — End: 2021-10-25

## 2021-07-14 NOTE — Progress Notes (Signed)
Established Patient Office Visit  Subjective:  Patient ID: Cameron Rogers, male    DOB: 04/27/1966  Age: 55 y.o. MRN: [id number]  CC:  Chief Complaint  Patient presents with   Urinary Tract Infection   Hypertension    Prostate     HPI Cameron Rogers presents for for follow-up of elevated blood pressure.  Patient reports that he was taking the amlodipine 10 mg but had not been taking the hydrochlorothiazide 25 mg as well.  Patient also reports some discolored urine but denies any pain or burning with urination.  He is wanting to get his prostate evaluated.  Past Medical History:  Diagnosis Date   Alcohol dependence (HCC)    Allergy    SEASONAL   Anemia    Arthritis    Heart murmur    Hypertension    Prediabetes    Unexplained weight loss 02/06/2021      Social History   Socioeconomic History   Marital status: Single    Spouse name: Not on file   Number of children: Not on file   Years of education: Not on file   Highest education level: Not on file  Occupational History   Occupation: Oceanographer  Tobacco Use   Smoking status: Every Day    Packs/day: 0.50    Years: 38.00    Pack years: 19.00    Types: Cigarettes   Smokeless tobacco: Never  Vaping Use   Vaping Use: Never used  Substance and Sexual Activity   Alcohol use: Yes    Comment: 3- 40 ounce beers daily;  h/o heavy use   Drug use: No   Sexual activity: Yes  Other Topics Concern   Not on file  Social History Narrative   Not on file   Social Determinants of Health   Financial Resource Strain: Not on file  Food Insecurity: Not on file  Transportation Needs: Not on file  Physical Activity: Not on file  Stress: Not on file  Social Connections: Not on file  Intimate Partner Violence: Not on file    ROS Review of Systems  Genitourinary:  Negative for difficulty urinating, dysuria and frequency.  All other systems reviewed and are negative.  Objective:   Today's Vitals: BP (!) 170/112  (BP Location: Right Arm, Patient Position: Sitting, Cuff Size: Large)   Pulse 88   Temp 98.2 F (36.8 C) (Oral)   Resp 16   Ht 6\' 2"  (1.88 m)   Wt 166 lb 6.4 oz (75.5 kg)   SpO2 98%   BMI 21.36 kg/m   Physical Exam Vitals and nursing note reviewed.  Constitutional:      General: He is not in acute distress. Cardiovascular:     Rate and Rhythm: Normal rate and regular rhythm.  Pulmonary:     Effort: Pulmonary effort is normal.     Breath sounds: Normal breath sounds.  Abdominal:     Palpations: Abdomen is soft.     Tenderness: There is no abdominal tenderness.  Musculoskeletal:     Right lower leg: No edema.     Left lower leg: No edema.  Neurological:     General: No focal deficit present.     Mental Status: He is alert and oriented to person, place, and time.    Assessment & Plan:   1. History of urine color changes Patient unable to give urine for evaluation. - POCT URINALYSIS DIP (CLINITEK)  2. Uncontrolled hypertension Discussed compliance.  Patient to restart hydrochlorothiazide  along with the amlodipine.  Lisinopril 10 mg was also added.  Will monitor.  3. Prostate cancer screening Monitoring labs ordered. - PSA    Outpatient Encounter Medications as of 07/13/2021  Medication Sig   amLODipine (NORVASC) 10 MG tablet Take 1 tablet (10 mg total) by mouth daily.   lisinopril (ZESTRIL) 10 MG tablet Take 1 tablet (10 mg total) by mouth daily.   doxycycline (VIBRAMYCIN) 100 MG capsule Take 1 capsule (100 mg total) by mouth 2 (two) times daily. (Patient not taking: Reported on 07/13/2021)   hydrochlorothiazide (HYDRODIURIL) 25 MG tablet Take 1 tablet (25 mg total) by mouth daily.   naltrexone (DEPADE) 50 MG tablet Take 1 tablet (50 mg total) by mouth daily. (Patient not taking: Reported on 07/13/2021)   [DISCONTINUED] hydrochlorothiazide (HYDRODIURIL) 25 MG tablet Take 1 tablet (25 mg total) by mouth daily.   [DISCONTINUED] omeprazole (PRILOSEC) 20 MG capsule Take 1  capsule (20 mg total) by mouth daily.   [DISCONTINUED] promethazine (PHENERGAN) 25 MG tablet Take 1 tablet (25 mg total) by mouth every 8 (eight) hours as needed for nausea or vomiting.   No facility-administered encounter medications on file as of 07/13/2021.    Follow-up: Return in about 10 days (around 07/23/2021) for follow up.   Tommie Raymond, MD

## 2021-07-14 NOTE — Addendum Note (Signed)
Addended by: Kieth Brightly on: 07/14/2021 03:37 PM   Modules accepted: Orders

## 2021-07-15 ENCOUNTER — Encounter (HOSPITAL_COMMUNITY): Payer: Self-pay | Admitting: Emergency Medicine

## 2021-07-15 ENCOUNTER — Observation Stay (HOSPITAL_COMMUNITY)
Admission: EM | Admit: 2021-07-15 | Discharge: 2021-07-16 | Disposition: A | Discharge status: Home / Self Care | Payer: Self-pay | Attending: Internal Medicine | Admitting: Internal Medicine

## 2021-07-15 ENCOUNTER — Other Ambulatory Visit: Payer: Self-pay

## 2021-07-15 DIAGNOSIS — Z79899 Other long term (current) drug therapy: Secondary | ICD-10-CM | POA: Insufficient documentation

## 2021-07-15 DIAGNOSIS — I10 Essential (primary) hypertension: Secondary | ICD-10-CM | POA: Insufficient documentation

## 2021-07-15 DIAGNOSIS — R7303 Prediabetes: Secondary | ICD-10-CM | POA: Insufficient documentation

## 2021-07-15 DIAGNOSIS — F1010 Alcohol abuse, uncomplicated: Secondary | ICD-10-CM

## 2021-07-15 DIAGNOSIS — Z8616 Personal history of COVID-19: Secondary | ICD-10-CM | POA: Insufficient documentation

## 2021-07-15 DIAGNOSIS — T783XXA Angioneurotic edema, initial encounter: Principal | ICD-10-CM | POA: Diagnosis present

## 2021-07-15 DIAGNOSIS — Z20822 Contact with and (suspected) exposure to covid-19: Secondary | ICD-10-CM | POA: Insufficient documentation

## 2021-07-15 DIAGNOSIS — F17210 Nicotine dependence, cigarettes, uncomplicated: Secondary | ICD-10-CM | POA: Insufficient documentation

## 2021-07-15 LAB — RESP PANEL BY RT-PCR (FLU A&B, COVID) ARPGX2
Influenza A by PCR: NEGATIVE
Influenza B by PCR: NEGATIVE
SARS Coronavirus 2 by RT PCR: NEGATIVE

## 2021-07-15 MED ORDER — ACETAMINOPHEN 650 MG RE SUPP: 650.0000 mg | Freq: Four times a day (QID) | RECTAL | Status: DC | PRN

## 2021-07-15 MED ORDER — ENOXAPARIN SODIUM 40 MG/0.4ML IJ SOSY
40.0000 mg | PREFILLED_SYRINGE | INTRAMUSCULAR | Status: DC
  Administered 2021-07-15 – 2021-07-16 (×2): 40 mg via SUBCUTANEOUS
  Filled 2021-07-15 (×2): qty 0.4

## 2021-07-15 MED ORDER — METHYLPREDNISOLONE SODIUM SUCC 125 MG IJ SOLR
125.0000 mg | Freq: Once | INTRAMUSCULAR | Status: AC
  Administered 2021-07-15: 125 mg via INTRAVENOUS
  Filled 2021-07-15: qty 2

## 2021-07-15 MED ORDER — THIAMINE HCL 100 MG PO TABS
100.0000 mg | ORAL_TABLET | Freq: Every day | ORAL | Status: DC
  Administered 2021-07-15 – 2021-07-16 (×2): 100 mg via ORAL
  Filled 2021-07-15 (×2): qty 1

## 2021-07-15 MED ORDER — FOLIC ACID 1 MG PO TABS
1.0000 mg | ORAL_TABLET | Freq: Every day | ORAL | Status: DC
  Administered 2021-07-15 – 2021-07-16 (×2): 1 mg via ORAL
  Filled 2021-07-15 (×2): qty 1

## 2021-07-15 MED ORDER — EPINEPHRINE 0.3 MG/0.3ML IJ SOAJ
0.3000 mg | Freq: Once | INTRAMUSCULAR | Status: AC
  Administered 2021-07-15: 0.3 mg via INTRAMUSCULAR
  Filled 2021-07-15: qty 0.3

## 2021-07-15 MED ORDER — DIPHENHYDRAMINE HCL 50 MG/ML IJ SOLN
25.0000 mg | Freq: Once | INTRAMUSCULAR | Status: AC
  Administered 2021-07-15: 25 mg via INTRAVENOUS
  Filled 2021-07-15: qty 1

## 2021-07-15 MED ORDER — ONDANSETRON HCL 4 MG PO TABS: 4.0000 mg | ORAL_TABLET | Freq: Four times a day (QID) | ORAL | Status: DC | PRN

## 2021-07-15 MED ORDER — LORAZEPAM 2 MG/ML IJ SOLN: 1.0000 mg | INTRAMUSCULAR | Status: DC | PRN

## 2021-07-15 MED ORDER — ACETAMINOPHEN 325 MG PO TABS: 650.0000 mg | ORAL_TABLET | Freq: Four times a day (QID) | ORAL | Status: DC | PRN

## 2021-07-15 MED ORDER — PREDNISONE 50 MG PO TABS
50.0000 mg | ORAL_TABLET | Freq: Every day | ORAL | Status: DC
  Administered 2021-07-16: 50 mg via ORAL
  Filled 2021-07-15: qty 1

## 2021-07-15 MED ORDER — THIAMINE HCL 100 MG/ML IJ SOLN: 100.0000 mg | Freq: Every day | INTRAMUSCULAR | Status: DC

## 2021-07-15 MED ORDER — FAMOTIDINE 20 MG PO TABS
20.0000 mg | ORAL_TABLET | Freq: Every day | ORAL | Status: DC
  Administered 2021-07-15 – 2021-07-16 (×2): 20 mg via ORAL
  Filled 2021-07-15 (×2): qty 1

## 2021-07-15 MED ORDER — ADULT MULTIVITAMIN W/MINERALS CH
1.0000 | ORAL_TABLET | Freq: Every day | ORAL | Status: DC
  Administered 2021-07-15 – 2021-07-16 (×2): 1 via ORAL
  Filled 2021-07-15 (×2): qty 1

## 2021-07-15 MED ORDER — LORAZEPAM 1 MG PO TABS: 1.0000 mg | ORAL_TABLET | ORAL | Status: DC | PRN

## 2021-07-15 MED ORDER — FAMOTIDINE IN NACL 20-0.9 MG/50ML-% IV SOLN
20.0000 mg | Freq: Once | INTRAVENOUS | Status: AC
  Administered 2021-07-15: 20 mg via INTRAVENOUS
  Filled 2021-07-15: qty 50

## 2021-07-15 MED ORDER — ONDANSETRON HCL 4 MG/2ML IJ SOLN: 4.0000 mg | Freq: Four times a day (QID) | INTRAMUSCULAR | Status: DC | PRN

## 2021-07-15 NOTE — Plan of Care (Signed)
  Problem: Education: Goal: Knowledge of General Education information will improve Description: Including pain rating scale, medication(s)/side effects and non-pharmacologic comfort measures Outcome: Progressing   Problem: Health Behavior/Discharge Planning: Goal: Ability to manage health-related needs will improve Outcome: Progressing   Problem: Clinical Measurements: Goal: Ability to maintain clinical measurements within normal limits will improve Outcome: Progressing Goal: Will remain free from infection Outcome: Progressing Goal: Diagnostic test results will improve Outcome: Progressing Goal: Respiratory complications will improve Outcome: Progressing Goal: Cardiovascular complication will be avoided Outcome: Progressing   Problem: Pain Managment: Goal: General experience of comfort will improve Outcome: Progressing   Problem: Safety: Goal: Ability to remain free from injury will improve Outcome: Progressing   Problem: Elimination: Goal: Will not experience complications related to bowel motility Outcome: Progressing Goal: Will not experience complications related to urinary retention Outcome: Progressing

## 2021-07-15 NOTE — H&P (Signed)
History and Physical    Cameron Rogers ATF:[id number] DOB: Oct 17, 1966 DOA: 07/15/2021  PCP: Georganna Skeans, MD  Patient coming from: Home  I have personally briefly reviewed patient's old medical records in [facility]  Chief Complaint: Oral swelling  HPI: Cameron Rogers is a 55 y.o. male with medical history significant of EtOH abuse, HTN.  Pt recently started on Lisinopril 3 days ago for HTN.  Pt also started on doxycycline today for Acne.  Pt presents to ED with oral swelling.  Onset earlier today, thinks it might have been reaction to doxycycline.  Symptoms constant, worsening, severe, nothing makes better or worse.  Has vomiting.  No rash,  No headache nor syncope.   ED Course: Pt with angioedema.  Due to concern for possible anaphylaxis, given epinephrine, steroids, benadryl in ED.  Swelling starting to improve but EDP wants obs.   Review of Systems: As per HPI, otherwise all review of systems negative.  Past Medical History:  Diagnosis Date   Alcohol dependence (HCC)    Allergy    SEASONAL   Anemia    Arthritis    Heart murmur    Hypertension    Prediabetes    Unexplained weight loss 02/06/2021    Past Surgical History:  Procedure Laterality Date   NO PAST SURGERIES       reports that he has been smoking cigarettes. He has a 19.00 pack-year smoking history. He has never used smokeless tobacco. He reports current alcohol use. He reports that he does not use drugs.  Allergies  Allergen Reactions   Doxycycline     Possibly caused angio edema, started same day as he developed this, but also had started lisinopril a couple days prior.   Lisinopril Swelling    Tongue swelled until couldn't breathe     Family History  Problem Relation Age of Onset   Diabetes Mother    Breast cancer Sister    Diabetes Sister    Heart murmur Sister    Diabetes Brother    Colon cancer Neg Hx    Colon polyps Neg Hx    Esophageal cancer Neg Hx    Rectal cancer Neg  Hx    Stomach cancer Neg Hx    Ovarian cancer Neg Hx      Prior to Admission medications   Medication Sig Start Date End Date Taking? Authorizing Provider  amLODipine (NORVASC) 10 MG tablet Take 1 tablet (10 mg total) by mouth daily. 02/23/21  Yes [name], MD  doxycycline (VIBRAMYCIN) 100 MG capsule Take 1 capsule (100 mg total) by mouth 2 (two) times daily. 07/12/21  Yes Jeannie Fend, PA-C  hydrochlorothiazide (HYDRODIURIL) 25 MG tablet Take 1 tablet (25 mg total) by mouth daily. 07/13/21  Yes Georganna Skeans, MD  lisinopril (ZESTRIL) 10 MG tablet Take 1 tablet (10 mg total) by mouth daily. Patient not taking: Reported on 07/15/2021 07/13/21   Georganna Skeans, MD  naltrexone (DEPADE) 50 MG tablet Take 1 tablet (50 mg total) by mouth daily. Patient not taking: No sig reported 02/23/21   [name], MD  sulfamethoxazole-trimethoprim (BACTRIM DS) 800-160 MG tablet Take 1 tablet by mouth 2 (two) times daily. 07/14/21   Georganna Skeans, MD  omeprazole (PRILOSEC) 20 MG capsule Take 1 capsule (20 mg total) by mouth daily. 04/25/17 04/02/19  [name], MD  promethazine (PHENERGAN) 25 MG tablet Take 1 tablet (25 mg total) by mouth every 8 (eight) hours as needed for nausea or vomiting. 04/25/17  04/02/19  [name], MD    Physical Exam: Vitals:   07/15/21 0430 07/15/21 0445 07/15/21 0515 07/15/21 0530  BP: 103/61 114/78 103/67 103/67  Pulse: 75 87 77 77  Resp: 14 (!) 22 14   SpO2: 98% 99% 98%     Constitutional: NAD, calm, comfortable Eyes: PERRL, lids and conjunctivae normal ENMT: Some submandibular swelling.  Voice is somewhat altered but is understandable. Neck: normal, supple, no masses, no thyromegaly Respiratory: clear to auscultation bilaterally, no wheezing, no crackles. Normal respiratory effort. No accessory muscle use.  Cardiovascular: Regular rate and rhythm, no murmurs / rubs / gallops. No extremity edema. 2+ pedal pulses. No carotid bruits.   Abdomen: no tenderness, no masses palpated. No hepatosplenomegaly. Bowel sounds positive.  Musculoskeletal: no clubbing / cyanosis. No joint deformity upper and lower extremities. Good ROM, no contractures. Normal muscle tone.  Skin: no rashes, lesions, ulcers. No induration Neurologic: CN 2-12 grossly intact. Sensation intact, DTR normal. Strength 5/5 in all 4.  Psychiatric: Normal judgment and insight. Alert and oriented x 3. Normal mood.    Labs on Admission: I have personally reviewed following labs and imaging studies  CBC: Recent Labs  Lab 07/12/21 1110  WBC 6.0  NEUTROABS 3.4  HGB 14.8  HCT 44.1  MCV 85.8  PLT 184   Basic Metabolic Panel: Recent Labs  Lab 07/12/21 1110  NA 135  K 3.7  CL 102  CO2 17*  GLUCOSE 131*  BUN 7  CREATININE 0.80  CALCIUM 9.1   GFR: Estimated Creatinine Clearance: 111.4 mL/min (by C-G formula based on SCr of 0.8 mg/dL). Liver Function Tests: Recent Labs  Lab 07/12/21 1110  AST 160*  ALT 56*  ALKPHOS 102  BILITOT 0.8  PROT 7.4  ALBUMIN 4.1   No results for input(s): LIPASE, AMYLASE in the last 168 hours. No results for input(s): AMMONIA in the last 168 hours. Coagulation Profile: Recent Labs  Lab 07/12/21 1110  INR 1.0   Cardiac Enzymes: No results for input(s): CKTOTAL, CKMB, CKMBINDEX, TROPONINI in the last 168 hours. BNP (last 3 results) No results for input(s): PROBNP in the last 8760 hours. HbA1C: No results for input(s): HGBA1C in the last 72 hours. CBG: No results for input(s): GLUCAP in the last 168 hours. Lipid Profile: No results for input(s): CHOL, HDL, LDLCALC, TRIG, CHOLHDL, LDLDIRECT in the last 72 hours. Thyroid Function Tests: No results for input(s): TSH, T4TOTAL, FREET4, T3FREE, THYROIDAB in the last 72 hours. Anemia Panel: No results for input(s): VITAMINB12, FOLATE, FERRITIN, TIBC, IRON, RETICCTPCT in the last 72 hours. Urine analysis:    Component Value Date/Time   COLORURINE YELLOW 02/05/2021  1841   APPEARANCEUR CLEAR 02/05/2021 1841   LABSPEC 1.006 02/05/2021 1841   PHURINE 5.5 02/05/2021 1841   GLUCOSEU 100 (A) 02/05/2021 1841   HGBUR TRACE (A) 02/05/2021 1841   BILIRUBINUR moderate (A) 07/14/2021 1537   KETONESUR small (15) (A) 07/14/2021 1537   KETONESUR NEGATIVE 02/05/2021 1841   PROTEINUR NEGATIVE 02/05/2021 1841   PROTEINUR trace (A) 02/05/2021 1641   UROBILINOGEN 1.0 07/14/2021 1537   NITRITE Positive (A) 07/14/2021 1537   NITRITE NEGATIVE 02/05/2021 1841   LEUKOCYTESUR Large (3+) (A) 07/14/2021 1537   LEUKOCYTESUR NEGATIVE 02/05/2021 1841    Radiological Exams on Admission: No results found.  EKG: Independently reviewed.  Assessment/Plan Principal Problem:   Angioedema Active Problems:   Essential hypertension   Alcohol abuse    Angioedema - Most likely from Lisinopril, though doxy allergy cant be  ruled out Stop both meds Got epi, steroids, benadryl in ED Admitting for airway monitoring Already improving per EDP and per patient Pt now talking, understandable, tolerating secretions, all of these apparently represent significant improvement from earlier per EDP and patient. HTN - Resume Norvasc and HCTZ when able to start taking POs again EtOH abuse - CIWA  DVT prophylaxis: Lovenox Code Status: Full Family Communication: No family in room Disposition Plan: Home after swelling improved and tolerating POs, hopefully later today Consults called: None Admission status: Place in obs    Labradford Schnitker M. DO [facility]  How to contact the [facility] Attending or Consulting provider 7A - 7P or covering provider during after hours 7P -7A, for this patient?  Check the care team in [facility] and look for a) attending/consulting [facility] provider listed and b) the [facility] team listed Log into [email or web address]  Amion Physician Scheduling and messaging for groups and whole hospitals  On call and physician scheduling software for group practices, residents, hospitalists  and other medical providers for call, clinic, rotation and shift schedules. On[employer] is a hospital-wide system for scheduling doctors and paging doctors on call. EasyPlot is for scientific plotting and data analysis.  [email or web address]  and use [place]'s universal password to access. If you do not have the password, please contact the hospital operator.  Locate the [facility] provider you are looking for under [facility] and page to a number that you can be directly reached. If you still have difficulty reaching the provider, please page the [facility] (Director on Call) for the Hospitalists listed on amion for assistance.  07/15/2021, 5:35 AM

## 2021-07-15 NOTE — Plan of Care (Signed)
  Problem: Clinical Measurements: Goal: Ability to maintain clinical measurements within normal limits will improve Outcome: Progressing   Problem: Activity: Goal: Risk for activity intolerance will decrease Outcome: Progressing   Problem: Nutrition: Goal: Adequate nutrition will be maintained Outcome: Progressing   Problem: Pain Managment: Goal: General experience of comfort will improve Outcome: Progressing   Problem: Safety: Goal: Ability to remain free from injury will improve Outcome: Progressing   

## 2021-07-15 NOTE — ED Triage Notes (Signed)
Pt c/o tongue swelling and difficulty breathing that began roughly 2 hours ago, and worsened just PTA. Pt states he think he's having a reaction to an acne medication he just started taking today. Pts speech muffled, swelling of tongue noted, rash on chest, tingling of right arm, and pt having difficulty maintaining saliva.

## 2021-07-15 NOTE — ED Provider Notes (Signed)
[facility]   CSN: [id number] Arrival date & time: 07/15/21  0109     History Chief Complaint  Patient presents with   Oral Swelling   Medication Reaction    Cameron Rogers is a 55 y.o. male.  54 year old male who presents emerged from today with oral swelling.  Patient states that he thinks it is a reaction to his acne medicine when she started earlier today.  On review of the records he is also on lisinopril.  This prescription was written 3 days ago.  He states that he just darted that recently as well.  Is never been on that before.  Patient does endorse vomiting but no nausea.  He denies any rash.  Denies any headache or syncope.  Has no other associated symptoms.  He has no AllerDur G's in the past that he knows of.       Past Medical History:  Diagnosis Date   Alcohol dependence (HCC)    Allergy    SEASONAL   Anemia    Arthritis    Heart murmur    Hypertension    Prediabetes    Unexplained weight loss 02/06/2021    Patient Active Problem List   Diagnosis Date Noted   Protein-calorie malnutrition, severe 02/08/2021   Alcohol withdrawal (HCC) 02/06/2021   Alcohol dependence with uncomplicated withdrawal (HCC) 02/06/2021   Nausea, vomiting, and diarrhea 02/06/2021   Groin rash 02/06/2021   Unexplained weight loss 02/06/2021   Hyponatremia 02/05/2021   Acute upper GI bleed 02/26/2020   COVID-19 virus infection 02/26/2020   Essential hypertension 11/28/2019   Neuropathy, alcoholic (HCC) 11/28/2019   Elevated PSA 11/28/2019   Elevated liver function tests 11/28/2019   Prediabetes 11/28/2019   Alcohol abuse 11/28/2019    Past Surgical History:  Procedure Laterality Date   NO PAST SURGERIES         Family History  Problem Relation Age of Onset   Diabetes Mother    Breast cancer Sister    Diabetes Sister    Heart murmur Sister    Diabetes Brother    Colon cancer Neg Hx    Colon polyps Neg Hx     Esophageal cancer Neg Hx    Rectal cancer Neg Hx    Stomach cancer Neg Hx    Ovarian cancer Neg Hx     Social History   Tobacco Use   Smoking status: Every Day    Packs/day: 0.50    Years: 38.00    Pack years: 19.00    Types: Cigarettes   Smokeless tobacco: Never  Vaping Use   Vaping Use: Never used  Substance Use Topics   Alcohol use: Yes    Comment: 3- 40 ounce beers daily;  h/o heavy use   Drug use: No    Home Medications Prior to Admission medications   Medication Sig Start Date End Date Taking? Authorizing Provider  amLODipine (NORVASC) 10 MG tablet Take 1 tablet (10 mg total) by mouth daily. 02/23/21   [name], MD  doxycycline (VIBRAMYCIN) 100 MG capsule Take 1 capsule (100 mg total) by mouth 2 (two) times daily. Patient not taking: Reported on 07/13/2021 07/12/21   Army Melia A, PA-C  hydrochlorothiazide (HYDRODIURIL) 25 MG tablet Take 1 tablet (25 mg total) by mouth daily. 07/13/21   Georganna Skeans, MD  lisinopril (ZESTRIL) 10 MG tablet Take 1 tablet (10 mg total) by mouth daily. 07/13/21   Georganna Skeans, MD  naltrexone (DEPADE)  50 MG tablet Take 1 tablet (50 mg total) by mouth daily. Patient not taking: Reported on 07/13/2021 02/23/21   [name], MD  sulfamethoxazole-trimethoprim (BACTRIM DS) 800-160 MG tablet Take 1 tablet by mouth 2 (two) times daily. 07/14/21   Georganna Skeans, MD  omeprazole (PRILOSEC) 20 MG capsule Take 1 capsule (20 mg total) by mouth daily. 04/25/17 04/02/19  [name], MD  promethazine (PHENERGAN) 25 MG tablet Take 1 tablet (25 mg total) by mouth every 8 (eight) hours as needed for nausea or vomiting. 04/25/17 04/02/19  [name], MD    Allergies    Patient has no known allergies.  Review of Systems   Review of Systems  All other systems reviewed and are negative.  Physical Exam Updated Vital Signs BP 110/77   Pulse (!) 114   Resp 18   SpO2 96%   Physical Exam Vitals and nursing note  reviewed.  Constitutional:      Appearance: He is well-developed.  HENT:     Head: Normocephalic and atraumatic.     Comments: Right sided tongue and sublingual edema    Mouth/Throat:     Mouth: Mucous membranes are moist.     Pharynx: Oropharynx is clear.  Eyes:     Pupils: Pupils are equal, round, and reactive to light.  Cardiovascular:     Rate and Rhythm: Normal rate.  Pulmonary:     Effort: Pulmonary effort is normal. No respiratory distress.  Abdominal:     General: Abdomen is flat. There is no distension.  Musculoskeletal:        General: No swelling or tenderness. Normal range of motion.     Cervical back: Normal range of motion.  Skin:    General: Skin is warm and dry.     Comments: Acne on face  Neurological:     General: No focal deficit present.     Mental Status: He is alert.    ED Results / Procedures / Treatments   Labs (all labs ordered are listed, but only abnormal results are displayed) Labs Reviewed - No data to display  EKG None  Radiology No results found.  Procedures .Critical Care Performed by: Marily Memos, MD Authorized by: Marily Memos, MD   Critical care provider statement:    Critical care time (minutes):  45   Critical care was necessary to treat or prevent imminent or life-threatening deterioration of the following conditions:  Respiratory failure, shock and metabolic crisis   Critical care was time spent personally by me on the following activities:  Discussions with consultants, evaluation of patient's response to treatment, examination of patient, ordering and performing treatments and interventions, ordering and review of laboratory studies, ordering and review of radiographic studies, pulse oximetry, re-evaluation of patient's condition, obtaining history from patient or surrogate and review of old charts   Medications Ordered in ED Medications  famotidine (PEPCID) IVPB 20 mg premix (has no administration in time range)   methylPREDNISolone sodium succinate (SOLU-MEDROL) 125 mg/2 mL injection 125 mg (has no administration in time range)  EPINEPHrine (EPI-PEN) injection 0.3 mg (0.3 mg Intramuscular Given 07/15/21 0126)  diphenhydrAMINE (BENADRYL) injection 25 mg (25 mg Intravenous Given 07/15/21 0127)    ED Course  I have reviewed the triage vital signs and the nursing notes.  Pertinent labs & imaging results that were available during my care of the patient were reviewed by me and considered in my medical decision making (see chart for details).    MDM Rules/Calculators/A&P  Suspect patient likely has angioedema however with the vomiting had earlier today he would likely meet criteria for anaphylaxis will treat with epinephrine as well along with a normal allergic reaction medications.  We will continue observation.  He does have some changes to his voice so will need close observation for airway support.  Reevaluated patient is multiple times.  1 time he had episode of what seems like a vagal reaction.  His heart rate got into the 60s and his blood pressure got to the 70s systolic.  This improved with fluids and never reoccurred.  I do not think this was a rebound anaphylaxis as it was shortly after getting epi.  Patient had some worsening of the sublingual edema but then slowly started improved.  His voice also started to improve and I could understand his words better.  Patient was monitored on end-tidal CO2, oxygenation and cardiac monitors.  No recurrent abnormalities however he has significant swelling still.  I suspect is probably angioedema from ACE inhibitor.  Discussed with hospitalist for further observation.  Final Clinical Impression(s) / ED Diagnoses Final diagnoses:  None    Rx / DC Orders ED Discharge Orders     None        Varshini Arrants, Barbara Cower, MD 07/15/21 [phone]

## 2021-07-15 NOTE — Progress Notes (Signed)
Patient is admitted this am for angioedema after newly started on lisinopril, details please refer to HPI, he received epi, solumedrol and pepcid in the ED, reports feeling better, ordered prednisone taper, continue pepcid, prn benadryl, start diet, monitor overnight, hopefully can go home tomorrow

## 2021-07-16 MED ORDER — PREDNISONE 10 MG PO TABS: ORAL_TABLET | ORAL | 0 refills | Status: DC

## 2021-07-16 MED ORDER — THIAMINE HCL 100 MG PO TABS: 100.0000 mg | ORAL_TABLET | Freq: Every day | ORAL | 0 refills | Status: DC

## 2021-07-16 MED ORDER — FAMOTIDINE 20 MG PO TABS: 20.0000 mg | ORAL_TABLET | Freq: Two times a day (BID) | ORAL | 0 refills | Status: DC

## 2021-07-16 MED ORDER — FOLIC ACID 1 MG PO TABS
1.0000 mg | ORAL_TABLET | Freq: Every day | ORAL | 0 refills | Status: DC
Start: 2021-07-17 — End: 2021-09-02

## 2021-07-16 NOTE — Discharge Summary (Signed)
Discharge Summary  Cameron Rogers FYB:[id number] DOB: 29-Jul-1966  PCP: Georganna Skeans, MD  Admit date: 07/15/2021 Discharge date: 07/16/2021  Time spent:   Recommendations for Outpatient Follow-up:  F/u with PCP within a week  for hospital discharge follow up, repeat cbc/bmp at follow up Patient is advised to check blood pressure at home bring in record for PCP to review  Stop lisinopril   Discharge Diagnoses:  Active Hospital Problems   Diagnosis Date Noted   Angioedema 07/15/2021   Alcohol abuse 11/28/2019   Essential hypertension 11/28/2019    Resolved Hospital Problems  No resolved problems to display.    Discharge Condition: stable  Diet recommendation: heart healthy, avoid alcohol  Filed Weights   07/15/21 1510  Weight: 79.2 kg    History of present illness: (Per admitting provider Dr. Julian Reil) Chief Complaint: Oral swelling   HPI: Cameron Rogers is a 55 y.o. male with medical history significant of EtOH abuse, HTN.   Pt recently started on Lisinopril 3 days ago for HTN.   Pt also started on doxycycline today for Acne.   Pt presents to ED with oral swelling.  Onset earlier today, thinks it might have been reaction to doxycycline.  Symptoms constant, worsening, severe, nothing makes better or worse.   Has vomiting.   No rash,   No headache nor syncope.     ED Course: Pt with angioedema.  Due to concern for possible anaphylaxis, given epinephrine, steroids, benadryl in ED.   Swelling starting to improve but EDP wants obs.  Hospital Course:  Principal Problem:   Angioedema Active Problems:   Essential hypertension   Alcohol abuse  Angioedema -Likely from lisinopril, but cannot rule out Doxy allergy, because both were newly started prior to angioedema -Received epi , steroid, Benadryl and Pepcid -Edema has resolved able to swallow without issues -Is discharged on short course of prednisone stable from, Pepcid  Hypertension Continue  Norvasc and HCTZ Follow-up with PCP  Alcohol use No withdrawal issues in the hospital Patient states he is ready to quit drinking alcohol   Discharge Exam: BP (!) 151/95 (BP Location: Left Arm)   Pulse 80   Temp 98.3 F (36.8 C)   Resp 16   Ht 6\' 2"  (1.88 m)   Wt 79.2 kg   SpO2 99%   BMI 22.42 kg/m   General: NAD Cardiovascular: RRR Respiratory: Normal respiratory effort  Discharge Instructions You were cared for by a hospitalist during your hospital stay. If you have any questions about your discharge medications or the care you received while you were in the hospital after you are discharged, you can call the unit and asked to speak with the hospitalist on call if the hospitalist that took care of you is not available. Once you are discharged, your primary care physician will handle any further medical issues. Please note that NO REFILLS for any discharge medications will be authorized once you are discharged, as it is imperative that you return to your primary care physician (or establish a relationship with a primary care physician if you do not have one) for your aftercare needs so that they can reassess your need for medications and monitor your lab values.  Discharge Instructions     Diet - low sodium heart healthy   Complete by: As directed    Increase activity slowly   Complete by: As directed       Allergies as of 07/16/2021       Reactions   Doxycycline  Possibly caused angio edema, started same day as he developed this, but also had started lisinopril a couple days prior.   Lisinopril Swelling   Tongue swelled until couldn't breathe         Medication List     STOP taking these medications    doxycycline 100 MG capsule Commonly known as: VIBRAMYCIN   lisinopril 10 MG tablet Commonly known as: ZESTRIL       TAKE these medications    amLODipine 10 MG tablet Commonly known as: NORVASC Take 1 tablet (10 mg total) by mouth daily.   famotidine  20 MG tablet Commonly known as: PEPCID Take 1 tablet (20 mg total) by mouth 2 (two) times daily for 7 days.   folic acid 1 MG tablet Commonly known as: FOLVITE Take 1 tablet (1 mg total) by mouth daily. Start taking on: July 17, 2021   hydrochlorothiazide 25 MG tablet Commonly known as: HYDRODIURIL Take 1 tablet (25 mg total) by mouth daily.   naltrexone 50 MG tablet Commonly known as: DEPADE Take 1 tablet (50 mg total) by mouth daily.   predniSONE 10 MG tablet Commonly known as: DELTASONE Take 30mg  ( 3tabs ) on 10/1, then 20mg  ( 2tabs) on 10/2, then 10mg  (one tab) on 10/3.   sulfamethoxazole-trimethoprim 800-160 MG tablet Commonly known as: BACTRIM DS Take 1 tablet by mouth 2 (two) times daily.   thiamine 100 MG tablet Take 1 tablet (100 mg total) by mouth daily. Start taking on: July 17, 2021       Allergies  Allergen Reactions   Doxycycline     Possibly caused angio edema, started same day as he developed this, but also had started lisinopril a couple days prior.   Lisinopril Swelling    Tongue swelled until couldn't breathe     Follow-up Information     12/2, MD Follow up on 07/28/2021.   Specialty: Family Medicine Why: hospital discharge follow up please check your blood pressure at home, bring in record for your doctor to review please avoid alcohol Contact information: [address] [place] 09/27/2021 [address] [phone]                  The results of significant diagnostics from this hospitalization (including imaging, microbiology, ancillary and laboratory) are listed below for reference.    Significant Diagnostic Studies: No results found.  Microbiology: Recent Results (from the past 240 hour(s))  Resp Panel by RT-PCR (Flu A&B, Covid) Nasopharyngeal Swab     Status: None   Collection Time: 07/15/21 12:49 PM   Specimen: Nasopharyngeal Swab; Nasopharyngeal(NP) swabs in vial transport medium  Result Value Ref Range  Status   SARS Coronavirus 2 by RT PCR NEGATIVE NEGATIVE Final    Comment: (NOTE) SARS-CoV-2 target nucleic acids are NOT DETECTED.  The SARS-CoV-2 RNA is generally detectable in upper respiratory specimens during the acute phase of infection. The lowest concentration of SARS-CoV-2 viral copies this assay can detect is 138 copies/mL. A negative result does not preclude SARS-Cov-2 infection and should not be used as the sole basis for treatment or other patient management decisions. A negative result may occur with  improper specimen collection/handling, submission of specimen other than nasopharyngeal swab, presence of viral mutation(s) within the areas targeted by this assay, and inadequate number of viral copies(<138 copies/mL). A negative result must be combined with clinical observations, patient history, and epidemiological information. The expected result is Negative.  Fact Sheet for Patients:  81017  Fact  Sheet for Healthcare Providers:  SeriousBroker.it  This test is no t yet approved or cleared by the [place] FDA and  has been authorized for detection and/or diagnosis of SARS-CoV-2 by FDA under an Emergency Use Authorization (EUA). This EUA will remain  in effect (meaning this test can be used) for the duration of the COVID-19 declaration under Section 564(b)(1) of the Act, 21 U.S.C.section 360bbb-3(b)(1), unless the authorization is terminated  or revoked sooner.       Influenza A by PCR NEGATIVE NEGATIVE Final   Influenza B by PCR NEGATIVE NEGATIVE Final    Comment: (NOTE) The Xpert Xpress SARS-CoV-2/FLU/RSV plus assay is intended as an aid in the diagnosis of influenza from Nasopharyngeal swab specimens and should not be used as a sole basis for treatment. Nasal washings and aspirates are unacceptable for Xpert Xpress SARS-CoV-2/FLU/RSV testing.  Fact Sheet for  Patients: BloggerCourse.com  Fact Sheet for Healthcare Providers: SeriousBroker.it  This test is not yet approved or cleared by the [place] FDA and has been authorized for detection and/or diagnosis of SARS-CoV-2 by FDA under an Emergency Use Authorization (EUA). This EUA will remain in effect (meaning this test can be used) for the duration of the COVID-19 declaration under Section 564(b)(1) of the Act, 21 U.S.C. section 360bbb-3(b)(1), unless the authorization is terminated or revoked.  Performed at [facility], [address] [place], [place] [address]      Labs: Basic Metabolic Panel: Recent Labs  Lab 07/12/21 1110  NA 135  K 3.7  CL 102  CO2 17*  GLUCOSE 131*  BUN 7  CREATININE 0.80  CALCIUM 9.1   Liver Function Tests: Recent Labs  Lab 07/12/21 1110  AST 160*  ALT 56*  ALKPHOS 102  BILITOT 0.8  PROT 7.4  ALBUMIN 4.1   No results for input(s): LIPASE, AMYLASE in the last 168 hours. No results for input(s): AMMONIA in the last 168 hours. CBC: Recent Labs  Lab 07/12/21 1110  WBC 6.0  NEUTROABS 3.4  HGB 14.8  HCT 44.1  MCV 85.8  PLT 184   Cardiac Enzymes: No results for input(s): CKTOTAL, CKMB, CKMBINDEX, TROPONINI in the last 168 hours. BNP: BNP (last 3 results) No results for input(s): BNP in the last 8760 hours.  ProBNP (last 3 results) No results for input(s): PROBNP in the last 8760 hours.  CBG: No results for input(s): GLUCAP in the last 168 hours.     Signed:  Albertine Grates MD, PhD, FACP  [facility] 07/16/2021, 10:31 AM

## 2021-07-16 NOTE — Progress Notes (Signed)
Provided patient with educational handouts for  angioedema and diet. Reviewed with patient. Patient appreciative of handouts. Will address additional questions as needed.

## 2021-07-16 NOTE — Plan of Care (Signed)
  Problem: Education: Goal: Knowledge of General Education information will improve Description: Including pain rating scale, medication(s)/side effects and non-pharmacologic comfort measures Outcome: Progressing   Problem: Health Behavior/Discharge Planning: Goal: Ability to manage health-related needs will improve Outcome: Progressing   Problem: Clinical Measurements: Goal: Ability to maintain clinical measurements within normal limits will improve Outcome: Progressing   Problem: Activity: Goal: Risk for activity intolerance will decrease Outcome: Adequate for Discharge   Problem: Nutrition: Goal: Adequate nutrition will be maintained Outcome: Adequate for Discharge   Problem: Coping: Goal: Level of anxiety will decrease Outcome: Adequate for Discharge

## 2021-07-20 ENCOUNTER — Telehealth: Payer: Self-pay

## 2021-07-20 NOTE — Telephone Encounter (Signed)
Transition Care Management Unsuccessful Follow-up Telephone Call  Date of discharge and from where:  [facility] on 07/15/2021  Attempts:  1st Attempt  Reason for unsuccessful TCM follow-up call:  Left voice message to call back and speak with this nurse.   Pt has HFU appt with MD Andrey Campanile on 07/28/2021

## 2021-07-21 ENCOUNTER — Telehealth: Payer: Self-pay

## 2021-07-21 NOTE — Telephone Encounter (Signed)
Transition Care Management Unsuccessful Follow-up Telephone Call  Date of discharge and from where:  07/16/2021, [facility]  Attempts:  2nd Attempt  Reason for unsuccessful TCM follow-up call:  Left voice message on # [phone].  Patient has appointment with Dr Andrey Campanile @ [facility] 07/28/2021.

## 2021-07-22 ENCOUNTER — Telehealth: Payer: Self-pay

## 2021-07-22 NOTE — Telephone Encounter (Signed)
Transition Care Management Unsuccessful Follow-up Telephone Call  Date of discharge and from where:  07/16/2021, [facility]  Attempts:  3rd Attempt  Reason for unsuccessful TCM follow-up call:  Left voice message on # [phone].  Patient has appointment with Dr Andrey Campanile @ [facility] -  07/28/2021

## 2021-07-28 ENCOUNTER — Encounter: Payer: Self-pay | Admitting: Family Medicine

## 2021-07-28 ENCOUNTER — Ambulatory Visit (INDEPENDENT_AMBULATORY_CARE_PROVIDER_SITE_OTHER): Payer: Self-pay | Admitting: Family Medicine

## 2021-07-28 ENCOUNTER — Other Ambulatory Visit: Payer: Self-pay

## 2021-07-28 VITALS — BP 136/77 | HR 93 | Temp 97.1°F | Resp 16 | Wt 176.2 lb

## 2021-07-28 DIAGNOSIS — I10 Essential (primary) hypertension: Secondary | ICD-10-CM

## 2021-07-28 DIAGNOSIS — F1010 Alcohol abuse, uncomplicated: Secondary | ICD-10-CM

## 2021-07-28 MED ORDER — HYDROCHLOROTHIAZIDE 25 MG PO TABS: 25.0000 mg | ORAL_TABLET | Freq: Every day | ORAL | 1 refills | Status: DC

## 2021-07-28 MED ORDER — AMLODIPINE BESYLATE 10 MG PO TABS: 10.0000 mg | ORAL_TABLET | Freq: Every day | ORAL | 1 refills | Status: DC

## 2021-07-28 NOTE — Progress Notes (Signed)
Established  Patient Office Visit  Subjective:  Patient ID: Cameron Rogers, male    DOB: [birth year]  Age: 55 y.o. MRN: [id number]  CC:  Chief Complaint  Patient presents with   Follow-up   Hypertension    HPI Cameron Rogers presents for follow up of hypertension. Denies acute complaints or concerns.   Past Medical History:  Diagnosis Date   Alcohol dependence (HCC)    Allergy    SEASONAL   Anemia    Arthritis    Heart murmur    Hypertension    Prediabetes    Unexplained weight loss 02/06/2021       Social History   Socioeconomic History   Marital status: Single    Spouse name: Not on file   Number of children: Not on file   Years of education: Not on file   Highest education level: Not on file  Occupational History   Occupation: Oceanographer  Tobacco Use   Smoking status: Every Day    Packs/day: 0.50    Years: 38.00    Pack years: 19.00    Types: Cigarettes   Smokeless tobacco: Never  Vaping Use   Vaping Use: Never used  Substance and Sexual Activity   Alcohol use: Yes    Comment: 3- 40 ounce beers daily;  h/o heavy use   Drug use: No   Sexual activity: Yes  Other Topics Concern   Not on file  Social History Narrative   Not on file   Social Determinants of Health   Financial Resource Strain: Not on file  Food Insecurity: Not on file  Transportation Needs: Not on file  Physical Activity: Not on file  Stress: Not on file  Social Connections: Not on file  Intimate Partner Violence: Not on file    ROS Review of Systems  Cardiovascular: Negative.   All other systems reviewed and are negative.  Objective:   Today's Vitals: BP 136/77   Pulse 93   Temp (!) 97.1 F (36.2 C) (Oral)   Resp 16   Wt 176 lb 3.2 oz (79.9 kg)   SpO2 94%   BMI 22.62 kg/m   Physical Exam Vitals and nursing note reviewed.  Constitutional:      General: He is not in acute distress. Cardiovascular:     Rate and Rhythm: Normal rate and regular rhythm.   Pulmonary:     Effort: Pulmonary effort is normal.     Breath sounds: Normal breath sounds.  Abdominal:     Palpations: Abdomen is soft.     Tenderness: There is no abdominal tenderness.  Musculoskeletal:     Right lower leg: No edema.     Left lower leg: No edema.  Neurological:     General: No focal deficit present.     Mental Status: He is alert and oriented to person, place, and time.    Assessment & Plan:    1. Essential hypertension Much improved readings with present management. Continue and monitor - amLODipine (NORVASC) 10 MG tablet; Take 1 tablet (10 mg total) by mouth daily.  Dispense: 90 tablet; Refill: 1  2. Alcohol abuse Patient has not been drinking since on naltrexone. Patient will complete doses remaining and then discontinue med.     Outpatient Encounter Medications as of 07/28/2021  Medication Sig   folic acid (FOLVITE) 1 MG tablet Take 1 tablet (1 mg total) by mouth daily.   naltrexone (DEPADE) 50 MG tablet Take 1 tablet (50 mg total) by  mouth daily.   predniSONE (DELTASONE) 10 MG tablet Take 30mg  ( 3tabs ) on 10/1, then 20mg  ( 2tabs) on 10/2, then 10mg  (one tab) on 10/3.   sulfamethoxazole-trimethoprim (BACTRIM DS) 800-160 MG tablet Take 1 tablet by mouth 2 (two) times daily.   thiamine 100 MG tablet Take 1 tablet (100 mg total) by mouth daily.   [DISCONTINUED] amLODipine (NORVASC) 10 MG tablet Take 1 tablet (10 mg total) by mouth daily.   [DISCONTINUED] hydrochlorothiazide (HYDRODIURIL) 25 MG tablet Take 1 tablet (25 mg total) by mouth daily.   amLODipine (NORVASC) 10 MG tablet Take 1 tablet (10 mg total) by mouth daily.   famotidine (PEPCID) 20 MG tablet Take 1 tablet (20 mg total) by mouth 2 (two) times daily for 7 days.   hydrochlorothiazide (HYDRODIURIL) 25 MG tablet Take 1 tablet (25 mg total) by mouth daily.   [DISCONTINUED] omeprazole (PRILOSEC) 20 MG capsule Take 1 capsule (20 mg total) by mouth daily.   [DISCONTINUED] promethazine (PHENERGAN) 25  MG tablet Take 1 tablet (25 mg total) by mouth every 8 (eight) hours as needed for nausea or vomiting.   No facility-administered encounter medications on file as of 07/28/2021.    Follow-up: Return in about 3 months (around 10/28/2021) for follow up.   , MD

## 2021-07-28 NOTE — Progress Notes (Signed)
Patient said that he is feeling excellent!!

## 2021-09-01 ENCOUNTER — Emergency Department (HOSPITAL_BASED_OUTPATIENT_CLINIC_OR_DEPARTMENT_OTHER): Payer: No Typology Code available for payment source

## 2021-09-01 ENCOUNTER — Emergency Department (HOSPITAL_BASED_OUTPATIENT_CLINIC_OR_DEPARTMENT_OTHER)
Admission: EM | Admit: 2021-09-01 | Discharge: 2021-09-01 | Disposition: A | Discharge status: Home / Self Care | Payer: No Typology Code available for payment source | Attending: Emergency Medicine | Admitting: Emergency Medicine

## 2021-09-01 ENCOUNTER — Other Ambulatory Visit: Payer: Self-pay

## 2021-09-01 ENCOUNTER — Encounter (HOSPITAL_BASED_OUTPATIENT_CLINIC_OR_DEPARTMENT_OTHER): Payer: Self-pay

## 2021-09-01 DIAGNOSIS — Y92410 Unspecified street and highway as the place of occurrence of the external cause: Secondary | ICD-10-CM | POA: Diagnosis not present

## 2021-09-01 DIAGNOSIS — S46919A Strain of unspecified muscle, fascia and tendon at shoulder and upper arm level, unspecified arm, initial encounter: Secondary | ICD-10-CM

## 2021-09-01 DIAGNOSIS — F17210 Nicotine dependence, cigarettes, uncomplicated: Secondary | ICD-10-CM | POA: Diagnosis not present

## 2021-09-01 DIAGNOSIS — Z79899 Other long term (current) drug therapy: Secondary | ICD-10-CM | POA: Insufficient documentation

## 2021-09-01 DIAGNOSIS — I10 Essential (primary) hypertension: Secondary | ICD-10-CM | POA: Insufficient documentation

## 2021-09-01 DIAGNOSIS — Z8616 Personal history of COVID-19: Secondary | ICD-10-CM | POA: Insufficient documentation

## 2021-09-01 DIAGNOSIS — M545 Low back pain, unspecified: Secondary | ICD-10-CM | POA: Insufficient documentation

## 2021-09-01 DIAGNOSIS — S4991XA Unspecified injury of right shoulder and upper arm, initial encounter: Secondary | ICD-10-CM | POA: Diagnosis present

## 2021-09-01 DIAGNOSIS — S46011A Strain of muscle(s) and tendon(s) of the rotator cuff of right shoulder, initial encounter: Secondary | ICD-10-CM | POA: Insufficient documentation

## 2021-09-01 MED ORDER — NAPROXEN 250 MG PO TABS
500.0000 mg | ORAL_TABLET | Freq: Once | ORAL | Status: AC
  Administered 2021-09-01: 500 mg via ORAL
  Filled 2021-09-01: qty 2
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
## 2021-09-01 NOTE — Discharge Instructions (Signed)
Take Tylenol or Motrin as needed for pain control.  Follow-up with your primary care doctor.  Come back to ER as needed.

## 2021-09-02 ENCOUNTER — Other Ambulatory Visit: Payer: Self-pay | Admitting: *Deleted

## 2021-09-02 MED ORDER — FOLIC ACID 1 MG PO TABS: 1.0000 mg | ORAL_TABLET | Freq: Every day | ORAL | 0 refills | Status: DC

## 2021-10-21 ENCOUNTER — Ambulatory Visit: Payer: Self-pay | Admitting: Family Medicine

## 2021-10-25 ENCOUNTER — Other Ambulatory Visit: Payer: Self-pay

## 2021-10-25 ENCOUNTER — Ambulatory Visit (INDEPENDENT_AMBULATORY_CARE_PROVIDER_SITE_OTHER): Payer: Self-pay | Admitting: Family Medicine

## 2021-10-25 ENCOUNTER — Encounter: Payer: Self-pay | Admitting: Family Medicine

## 2021-10-25 VITALS — BP 145/82 | HR 94 | Temp 98.2°F | Resp 16 | Wt 215.8 lb

## 2021-10-25 DIAGNOSIS — M79671 Pain in right foot: Secondary | ICD-10-CM

## 2021-10-25 DIAGNOSIS — M79672 Pain in left foot: Secondary | ICD-10-CM

## 2021-10-25 DIAGNOSIS — I10 Essential (primary) hypertension: Secondary | ICD-10-CM

## 2021-10-25 MED ORDER — AMLODIPINE BESYLATE 10 MG PO TABS: 10.0000 mg | ORAL_TABLET | Freq: Every day | ORAL | 1 refills | Status: DC

## 2021-10-25 MED ORDER — HYDROCHLOROTHIAZIDE 25 MG PO TABS: 25.0000 mg | ORAL_TABLET | Freq: Every day | ORAL | 1 refills | Status: DC

## 2021-10-25 NOTE — Progress Notes (Signed)
Patient is here for follow-up HTN. Patient said that he takes his medication  daily and is doing better   Patient is having really bad foot pain in his sleep and throughout the day. 10/10 and patient said  he need something for pain

## 2021-10-26 ENCOUNTER — Encounter: Payer: Self-pay | Admitting: Family Medicine

## 2021-10-26 NOTE — Progress Notes (Signed)
Established  Patient Office Visit  Subjective:  Patient ID: Cameron Rogers, male    DOB: 1966-03-22  Age: 56 y.o. MRN: [id number]  CC:  Chief Complaint  Patient presents with   Follow-up    HPI Cameron Rogers presents for follow up of hypertension. Patient also reports that he has bilateral foot pain. Denies known trauma or injury. Has been experiencing sx for awhile. Has taken no meds for sx.   Past Medical History:  Diagnosis Date   Alcohol dependence ([facility])    Allergy    SEASONAL   Anemia    Arthritis    Heart murmur    Hypertension    Prediabetes    Unexplained weight loss 02/06/2021    Past Surgical History:  Procedure Laterality Date   NO PAST SURGERIES      Family History  Problem Relation Age of Onset   Diabetes Mother    Breast cancer Sister    Diabetes Sister    Heart murmur Sister    Diabetes Brother    Colon cancer Neg Hx    Colon polyps Neg Hx    Esophageal cancer Neg Hx    Rectal cancer Neg Hx    Stomach cancer Neg Hx    Ovarian cancer Neg Hx     Social History   Socioeconomic History   Marital status: Single    Spouse name: Not on file   Number of children: Not on file   Years of education: Not on file   Highest education level: Not on file  Occupational History   Occupation: Oceanographer  Tobacco Use   Smoking status: Every Day    Packs/day: 0.50    Years: 38.00    Pack years: 19.00    Types: Cigarettes   Smokeless tobacco: Never  Vaping Use   Vaping Use: Never used  Substance and Sexual Activity   Alcohol use: Not Currently   Drug use: No   Sexual activity: Yes  Other Topics Concern   Not on file  Social History Narrative   Not on file   Social Determinants of Health   Financial Resource Strain: Not on file  Food Insecurity: Not on file  Transportation Needs: Not on file  Physical Activity: Not on file  Stress: Not on file  Social Connections: Not on file  Intimate Partner Violence: Not on file    ROS Review  of Systems  All other systems reviewed and are negative.  Objective:   Today's Vitals: BP (!) 145/82    Pulse 94    Temp 98.2 F (36.8 C) (Oral)    Resp 16    Wt 215 lb 12.8 oz (97.9 kg)    SpO2 96%    BMI 27.71 kg/m   Physical Exam Vitals and nursing note reviewed.  Constitutional:      General: He is not in acute distress. Cardiovascular:     Rate and Rhythm: Normal rate and regular rhythm.  Pulmonary:     Effort: Pulmonary effort is normal.     Breath sounds: Normal breath sounds.  Abdominal:     Palpations: Abdomen is soft.     Tenderness: There is no abdominal tenderness.  Musculoskeletal:     Right lower leg: No edema.     Left lower leg: No edema.     Right foot: Tenderness present. No deformity.     Left foot: Tenderness present. No deformity.  Neurological:     General: No focal deficit present.  Mental Status: He is alert and oriented to person, place, and time.    Assessment & Plan:   1. Essential hypertension Patient defers further changes in regimen at this time. Will continue and monitor. Meds refilled. - amLODipine (NORVASC) 10 MG tablet; Take 1 tablet (10 mg total) by mouth daily.  Dispense: 90 tablet; Refill: 1  2. Pain in both feet Patient defers referral for further eval/mgt. Tylenol/nsaids prn. Discussed foot care and appropriate footwear. Will monitor    Outpatient Encounter Medications as of 10/25/2021  Medication Sig   amLODipine (NORVASC) 10 MG tablet Take 1 tablet (10 mg total) by mouth daily.   hydrochlorothiazide (HYDRODIURIL) 25 MG tablet Take 1 tablet (25 mg total) by mouth daily.   [DISCONTINUED] amLODipine (NORVASC) 10 MG tablet Take 1 tablet (10 mg total) by mouth daily.   [DISCONTINUED] famotidine (PEPCID) 20 MG tablet Take 1 tablet (20 mg total) by mouth 2 (two) times daily for 7 days.   [DISCONTINUED] folic acid (FOLVITE) 1 MG tablet Take 1 tablet (1 mg total) by mouth daily.   [DISCONTINUED] folic acid (FOLVITE) 1 MG tablet Take 1  tablet (1 mg total) by mouth daily.   [DISCONTINUED] hydrochlorothiazide (HYDRODIURIL) 25 MG tablet Take 1 tablet (25 mg total) by mouth daily.   [DISCONTINUED] naltrexone (DEPADE) 50 MG tablet Take 1 tablet (50 mg total) by mouth daily.   [DISCONTINUED] omeprazole (PRILOSEC) 20 MG capsule Take 1 capsule (20 mg total) by mouth daily.   [DISCONTINUED] predniSONE (DELTASONE) 10 MG tablet Take 30mg  ( 3tabs ) on 10/1, then 20mg  ( 2tabs) on 10/2, then 10mg  (one tab) on 10/3.   [DISCONTINUED] promethazine (PHENERGAN) 25 MG tablet Take 1 tablet (25 mg total) by mouth every 8 (eight) hours as needed for nausea or vomiting.   [DISCONTINUED] sulfamethoxazole-trimethoprim (BACTRIM DS) 800-160 MG tablet Take 1 tablet by mouth 2 (two) times daily.   [DISCONTINUED] thiamine 100 MG tablet Take 1 tablet (100 mg total) by mouth daily.   No facility-administered encounter medications on file as of 10/25/2021.    Follow-up: Return in about 4 months (around 02/22/2022).   12/3, MD

## 2022-01-20 ENCOUNTER — Other Ambulatory Visit: Payer: Self-pay | Admitting: Family Medicine

## 2022-01-20 ENCOUNTER — Telehealth: Payer: Self-pay | Admitting: Family Medicine

## 2022-01-20 DIAGNOSIS — I10 Essential (primary) hypertension: Secondary | ICD-10-CM

## 2022-01-20 NOTE — Telephone Encounter (Signed)
Copied from CRM [id number]. Topic: Quick Communication - Rx Refill/Question ?>> Jan 20, 2022  3:52 PM Jaquita Rector A wrote: ?Medication: gabapentin (NEURONTIN) capsule 300 mg   ?  ?Has the patient contacted their pharmacy? No. ?(Agent: If no, request that the patient contact the pharmacy for the refill. If patient does not wish to contact the pharmacy document the reason why and proceed with request.) ?(Agent: If yes, when and what did the pharmacy advise?) ?  ?Preferred Pharmacy (with phone number or street name): [facility] - [place], [place] - [address]           ?Phone:             [phone] ?Fax:     [phone] ?  ?

## 2022-01-20 NOTE — Telephone Encounter (Signed)
Copied from [place] [id number]. Topic: Quick Communication - Rx Refill/Question ?>> Jan 20, 2022  3:52 PM Leward Quan A wrote: ?Medication: amLODipine (NORVASC) 10 MG tablet, gabapentin (NEURONTIN) capsule 300 mg   ? ?Has the patient contacted their pharmacy? No. ?(Agent: If no, request that the patient contact the pharmacy for the refill. If patient does not wish to contact the pharmacy document the reason why and proceed with request.) ?(Agent: If yes, when and what did the pharmacy advise?) ? ?Preferred Pharmacy (with phone number or street name): [facility], [place]  ?Phone:  [phone] ?Fax:  [phone] ? ? ? ? ? ?Has the patient been seen for an appointment in the last year OR does the patient have an upcoming appointment? Yes.   ? ?Agent: Please be advised that RX refills may take up to 3 business days. We ask that you follow-up with your pharmacy. ?

## 2022-01-21 MED ORDER — AMLODIPINE BESYLATE 10 MG PO TABS: 10.0000 mg | ORAL_TABLET | Freq: Every day | ORAL | 0 refills | Status: DC

## 2022-01-21 NOTE — Telephone Encounter (Signed)
Requested Prescriptions  ?Pending Prescriptions Disp Refills  ?? amLODipine (NORVASC) 10 MG tablet 90 tablet 0  ?  Sig: Take 1 tablet (10 mg total) by mouth daily.  ?  ? Cardiovascular: Calcium Channel Blockers 2 Failed - 01/20/2022  5:28 PM  ?  ?  Failed - Last BP in normal range  ?  BP Readings from Last 1 Encounters:  ?10/25/21 (!) 145/82  ?   ?  ?  Passed - Last Heart Rate in normal range  ?  Pulse Readings from Last 1 Encounters:  ?10/25/21 94  ?   ?  ?  Passed - Valid encounter within last 6 months  ?  Recent Outpatient Visits   ?      ? 2 months ago Essential hypertension  ? Primary Care at [facility], MD  ? 5 months ago Essential hypertension  ? Primary Care at [facility], Lauris Poag, MD  ? 6 months ago History of urine color changes  ? Primary Care at [facility], MD  ? 11 months ago Hospital discharge follow-up  ? Primary Care at [facility], Kandee Keen, MD  ? 1 year ago Facial dermatitis  ? Primary Care at [facility], MD  ?  ?  ?Future Appointments   ?        ? In 1 month Georganna Skeans, MD Primary Care at [facility]  ?  ? ?  ?  ?  ? ?

## 2022-01-26 NOTE — Telephone Encounter (Signed)
Patient was called and VM left for him to make a OV appt for medication change ?

## 2022-02-01 ENCOUNTER — Other Ambulatory Visit: Payer: Self-pay

## 2022-02-01 ENCOUNTER — Ambulatory Visit
Admission: EM | Admit: 2022-02-01 | Discharge: 2022-02-01 | Disposition: A | Discharge status: Other Acute Inpatient Hospital | Payer: 59

## 2022-02-01 ENCOUNTER — Encounter: Payer: Self-pay | Admitting: Emergency Medicine

## 2022-02-01 DIAGNOSIS — R5383 Other fatigue: Secondary | ICD-10-CM

## 2022-02-01 DIAGNOSIS — R310 Gross hematuria: Secondary | ICD-10-CM

## 2022-02-01 DIAGNOSIS — R634 Abnormal weight loss: Secondary | ICD-10-CM | POA: Diagnosis not present

## 2022-02-01 DIAGNOSIS — K921 Melena: Secondary | ICD-10-CM | POA: Diagnosis not present

## 2022-02-01 NOTE — ED Triage Notes (Signed)
Pt here for some blood noted in his urine x 3 days and some blood noted in stool; pt sts he feels "poorly" but can not describe how ?

## 2022-02-01 NOTE — ED Notes (Signed)
Patient is being discharged from the Urgent Care and sent to the Emergency Department via pov . Per hm, patient is in need of higher level of care due to rectal bleeding and hematuria. Patient is aware and verbalizes understanding of plan of care.  ?Vitals:  ? 02/01/22 1230  ?BP: (!) 147/102  ?Pulse: 99  ?Resp: 18  ?Temp: 98.2 ?F (36.8 ?C)  ?SpO2: 97%  ?  ?

## 2022-02-01 NOTE — Discharge Instructions (Signed)
Please go to the emergency department as soon as you leave urgent care for further evaluation and management. ?

## 2022-02-01 NOTE — ED Provider Notes (Signed)
?[name] ? ? ? ?CSN: [id number] ?Arrival date & time: 02/01/22  1201 ? ? ?  ? ?History   ?Chief Complaint ?Chief Complaint  ?Patient presents with  ? Hematuria  ? Rectal Bleeding  ? ? ?HPI ?Cameron Rogers is a 56 y.o. male.  ? ?Patient presents with several different chief complaints today.  Patient reports hematuria, blood in stool, fatigue, weight loss, and generally not feeling well for the past 3 days.  Patient reports that he noticed pink-tinged urine but denies any urinary burning, urinary frequency, back pain, testicular pain, penile discharge.  He also noticed a large amount of dark-colored blood in his stool over the past few days with associated abdominal cramping.  Denies any rectal pain or any hemorrhoids that are noticeable.  Abdominal pain is intermittent and is generalized.  He describes it as cramping.  Patient had episodes of nausea with nonbloody emesis as well that has now resolved.  Denies any associated fevers.  Pertinent medical history includes alcohol abuse, and patient reports that he typically drinks 1-1.5 Bud Light's daily which is a decrease from his regular daily alcohol intake.  He also reports that he has lost approximately 40 pounds in about 2 months.  He is still eating and drinking appropriately. ? ? ?Hematuria ? ?Rectal Bleeding ? ?Past Medical History:  ?Diagnosis Date  ? Alcohol dependence (HCC)   ? Allergy   ? SEASONAL  ? Anemia   ? Arthritis   ? Heart murmur   ? Hypertension   ? Prediabetes   ? Unexplained weight loss 02/06/2021  ? ? ?Patient Active Problem List  ? Diagnosis Date Noted  ? Angioedema 07/15/2021  ? Protein-calorie malnutrition, severe 02/08/2021  ? Alcohol withdrawal (HCC) 02/06/2021  ? Alcohol dependence with uncomplicated withdrawal (HCC) 02/06/2021  ? Nausea, vomiting, and diarrhea 02/06/2021  ? Groin rash 02/06/2021  ? Unexplained weight loss 02/06/2021  ? Hyponatremia 02/05/2021  ? Acute upper GI bleed 02/26/2020  ? COVID-19 virus infection  02/26/2020  ? Essential hypertension 11/28/2019  ? Neuropathy, alcoholic (HCC) 11/28/2019  ? Elevated PSA 11/28/2019  ? Elevated liver function tests 11/28/2019  ? Prediabetes 11/28/2019  ? Alcohol abuse 11/28/2019  ? ? ?Past Surgical History:  ?Procedure Laterality Date  ? NO PAST SURGERIES    ? ? ? ? ? ?Home Medications   ? ?Prior to Admission medications   ?Medication Sig Start Date End Date Taking? Authorizing Provider  ?amLODipine (NORVASC) 10 MG tablet Take 1 tablet (10 mg total) by mouth daily. 01/21/22   Georganna Skeans, MD  ?hydrochlorothiazide (HYDRODIURIL) 25 MG tablet Take 1 tablet (25 mg total) by mouth daily. 10/25/21   Georganna Skeans, MD  ?omeprazole (PRILOSEC) 20 MG capsule Take 1 capsule (20 mg total) by mouth daily. 04/25/17 04/02/19  [name], MD  ?promethazine (PHENERGAN) 25 MG tablet Take 1 tablet (25 mg total) by mouth every 8 (eight) hours as needed for nausea or vomiting. 04/25/17 04/02/19  [name], MD  ? ? ?Family History ?Family History  ?Problem Relation Age of Onset  ? Diabetes Mother   ? Breast cancer Sister   ? Diabetes Sister   ? Heart murmur Sister   ? Diabetes Brother   ? Colon cancer Neg Hx   ? Colon polyps Neg Hx   ? Esophageal cancer Neg Hx   ? Rectal cancer Neg Hx   ? Stomach cancer Neg Hx   ? Ovarian cancer Neg Hx   ? ? ?Social History ?Social  History  ? ?Tobacco Use  ? Smoking status: Every Day  ?  Packs/day: 0.50  ?  Years: 38.00  ?  Pack years: 19.00  ?  Types: Cigarettes  ? Smokeless tobacco: Never  ?Vaping Use  ? Vaping Use: Never used  ?Substance Use Topics  ? Alcohol use: Not Currently  ? Drug use: No  ? ? ? ?Allergies   ?Doxycycline and Lisinopril ? ? ?Review of Systems ?Review of Systems  ?Gastrointestinal:  Positive for hematochezia.  ?Per HPI ? ?Physical Exam ?Triage Vital Signs ?ED Triage Vitals  ?Enc Vitals Group  ?   BP 02/01/22 1230 (!) 147/102  ?   Pulse Rate 02/01/22 1230 99  ?   Resp 02/01/22 1230 18  ?   Temp 02/01/22 1230 98.2 ?F (36.8 ?C)  ?   Temp  Source 02/01/22 1230 Oral  ?   SpO2 02/01/22 1230 97 %  ?   Weight --   ?   Height --   ?   Head Circumference --   ?   Peak Flow --   ?   Pain Score 02/01/22 1231 4  ?   Pain Loc --   ?   Pain Edu? --   ?   Excl. in GC? --   ? ?No data found. ? ?Updated Vital Signs ?BP (!) 147/102 (BP Location: Left Arm)   Pulse 99   Temp 98.2 ?F (36.8 ?C) (Oral)   Resp 18   SpO2 97%  ? ?Visual Acuity ?Right Eye Distance:   ?Left Eye Distance:   ?Bilateral Distance:   ? ?Right Eye Near:   ?Left Eye Near:    ?Bilateral Near:    ? ?Physical Exam ?Constitutional:   ?   General: He is not in acute distress. ?   Appearance: Normal appearance. He is not toxic-appearing or diaphoretic.  ?HENT:  ?   Head: Normocephalic and atraumatic.  ?Eyes:  ?   Extraocular Movements: Extraocular movements intact.  ?   Conjunctiva/sclera: Conjunctivae normal.  ?   Pupils: Pupils are equal, round, and reactive to light.  ?Cardiovascular:  ?   Rate and Rhythm: Normal rate and regular rhythm.  ?   Pulses: Normal pulses.  ?   Heart sounds: Normal heart sounds.  ?Pulmonary:  ?   Effort: Pulmonary effort is normal.  ?   Breath sounds: Normal breath sounds.  ?Abdominal:  ?   General: Abdomen is flat. Bowel sounds are normal. There is no distension.  ?   Palpations: Abdomen is soft.  ?   Tenderness: There is no abdominal tenderness.  ?Neurological:  ?   General: No focal deficit present.  ?   Mental Status: He is alert and oriented to person, place, and time. Mental status is at baseline.  ?   Cranial Nerves: Cranial nerves 2-12 are intact.  ?   Sensory: Sensation is intact.  ?   Motor: Motor function is intact.  ?   Coordination: Coordination is intact.  ?   Gait: Gait is intact.  ?Psychiatric:     ?   Mood and Affect: Mood normal.     ?   Behavior: Behavior normal.     ?   Thought Content: Thought content normal.     ?   Judgment: Judgment normal.  ? ? ? ?UC Treatments / Results  ?Labs ?(all labs ordered are listed, but only abnormal results are  displayed) ?Labs Reviewed - No data to display ? ?EKG ? ? ?  Radiology ?No results found. ? ?Procedures ?Procedures (including critical care time) ? ?Medications Ordered in UC ?Medications - No data to display ? ?Initial Impression / Assessment and Plan / UC Course  ?I have reviewed the triage vital signs and the nursing notes. ? ?Pertinent labs & imaging results that were available during my care of the patient were reviewed by me and considered in my medical decision making (see chart for details). ? ?  ? ?Due to these significant chief complaints, patient was advised that he will need to go to the emergency department for further evaluation and management.  Patient was agreeable with plan.  Vital signs stable at discharge.  Agree with patient self transport to the hospital. ?Final Clinical Impressions(s) / UC Diagnoses  ? ?Final diagnoses:  ?Blood in stool  ?Gross hematuria  ?Other fatigue  ?Loss of weight  ? ? ? ?Discharge Instructions   ? ?  ?Please go to the emergency department as soon as you leave urgent care for further evaluation and management. ? ? ? ?ED Prescriptions   ?None ?  ? ?PDMP not reviewed this encounter. ?  ?Gustavus BryantMound, [place] E, [place]NP ?02/01/22 1251 ? ?

## 2022-02-02 ENCOUNTER — Emergency Department (HOSPITAL_COMMUNITY): Payer: 59

## 2022-02-02 ENCOUNTER — Encounter (HOSPITAL_COMMUNITY): Payer: Self-pay

## 2022-02-02 ENCOUNTER — Other Ambulatory Visit: Payer: Self-pay

## 2022-02-02 ENCOUNTER — Emergency Department (HOSPITAL_COMMUNITY)
Admission: EM | Admit: 2022-02-02 | Discharge: 2022-02-02 | Disposition: A | Discharge status: Home / Self Care | Payer: 59 | Attending: Emergency Medicine | Admitting: Emergency Medicine

## 2022-02-02 DIAGNOSIS — R42 Dizziness and giddiness: Secondary | ICD-10-CM | POA: Diagnosis present

## 2022-02-02 DIAGNOSIS — R531 Weakness: Secondary | ICD-10-CM | POA: Diagnosis not present

## 2022-02-02 DIAGNOSIS — R319 Hematuria, unspecified: Secondary | ICD-10-CM | POA: Insufficient documentation

## 2022-02-02 LAB — URINALYSIS, ROUTINE W REFLEX MICROSCOPIC
Bilirubin Urine: NEGATIVE
Glucose, UA: NEGATIVE mg/dL
Hgb urine dipstick: NEGATIVE
Ketones, ur: NEGATIVE mg/dL
Leukocytes,Ua: NEGATIVE
Nitrite: NEGATIVE
Protein, ur: NEGATIVE mg/dL
Specific Gravity, Urine: 1.014 (ref 1.005–1.030)
pH: 5 (ref 5.0–8.0)

## 2022-02-02 LAB — TYPE AND SCREEN
ABO/RH(D): O POS
Antibody Screen: NEGATIVE

## 2022-02-02 LAB — BASIC METABOLIC PANEL
Anion gap: 12 (ref 5–15)
BUN: 11 mg/dL (ref 6–20)
CO2: 24 mmol/L (ref 22–32)
Calcium: 9.3 mg/dL (ref 8.9–10.3)
Chloride: 97 mmol/L — ABNORMAL LOW (ref 98–111)
Creatinine, Ser: 0.96 mg/dL (ref 0.61–1.24)
GFR, Estimated: >60 mL/min (ref >60)
Glucose, Bld: 97 mg/dL (ref 70–99)
Potassium: 3.1 mmol/L — ABNORMAL LOW (ref 3.5–5.1)
Sodium: 133 mmol/L — ABNORMAL LOW (ref 135–145)

## 2022-02-02 LAB — CBG MONITORING, ED: Glucose-Capillary: 129 mg/dL — ABNORMAL HIGH (ref 70–99)

## 2022-02-02 LAB — CBC
HCT: 44.4 % (ref 39.0–52.0)
Hemoglobin: 15.3 g/dL (ref 13.0–17.0)
MCH: 28.4 pg (ref 26.0–34.0)
MCHC: 34.5 g/dL (ref 30.0–36.0)
MCV: 82.4 fL (ref 80.0–100.0)
Platelets: 184 10*3/uL (ref 150–400)
RBC: 5.39 MIL/uL (ref 4.22–5.81)
RDW: 16.2 % — ABNORMAL HIGH (ref 11.5–15.5)
WBC: 7.4 10*3/uL (ref 4.0–10.5)
nRBC: 0 % (ref 0.0–0.2)

## 2022-02-02 LAB — TROPONIN I (HIGH SENSITIVITY)
Troponin I (High Sensitivity): 17 ng/L (ref <18)
Troponin I (High Sensitivity): 18 ng/L — ABNORMAL HIGH (ref <18)

## 2022-02-02 NOTE — ED Provider Triage Note (Signed)
Emergency Medicine Provider Triage Evaluation Note ? ?Cameron Rogers , a 56 y.o. male  was evaluated in triage.  Pt complains of intermittent weakness, dizziness and fatigue.  Says this has been going on for couple weeks however he went to urgent care yesterday because he noted some blood in his stool.  Is not on blood thinners, reports smoking cigarettes and drinking 2 40s a day.  Also says he has had weight loss of unknown etiology. ? ?Review of Systems  ?Positive: Fatigue, dizzy ?Negative: cp ? ?Physical Exam  ?BP (!) 158/105 (BP Location: Right Arm)   Pulse (!) 102   Temp 99.1 ?F (37.3 ?C) (Oral)   Resp 18   Ht 6\' 2"  (1.88 m)   Wt 90.7 kg   SpO2 99%   BMI 25.68 kg/m?  ?Gen:   Awake, no distress   ?Resp:  Normal effort  ?MSK:   Moves extremities without difficulty  ?Other:  Tachycardic, regular rhythm, lung sounds clear ? ?Medical Decision Making  ?Medically screening exam initiated at 1:18 PM.  Appropriate orders placed.  Hitoshi Werts was informed that the remainder of the evaluation will be completed by another provider, this initial triage assessment does not replace that evaluation, and the importance of remaining in the ED until their evaluation is complete. ? ?Question anemia and malignancy.  Labs ordered ?  ?[name], PA-C ?02/02/22 1325 ? ?

## 2022-02-02 NOTE — ED Notes (Signed)
Pt asked for urine sample, states cant urinate. Pt has urine cup ?

## 2022-02-02 NOTE — ED Provider Notes (Signed)
?[facility] EMERGENCY DEPARTMENT ?Provider Note ? ? ?CSN: [id number] ?Arrival date & time: 02/02/22  1244 ? ?  ? ?History ? ?Chief Complaint  ?Patient presents with  ? Dizziness  ? Hematuria  ? ? ?Seth Higginbotham is a 56 y.o. male. ? ?The history is provided by the patient.  ?Dizziness ?Quality:  Lightheadedness ?Severity:  Mild ?Onset quality:  Gradual ?Timing:  Intermittent ?Progression:  Waxing and waning ?Chronicity:  New ?Context: standing up   ?Relieved by:  Nothing ?Worsened by:  Nothing ?Associated symptoms: weakness   ?Associated symptoms: no blood in stool, no chest pain, no diarrhea, no headaches, no hearing loss, no nausea, no palpitations, no shortness of breath, no syncope, no tinnitus, no vision changes and no vomiting   ?Risk factors: no new medications   ?Risk factors comment:  Blood pressure ?Hematuria ?Pertinent negatives include no chest pain, no headaches and no shortness of breath.  ? ?  ? ?Home Medications ?Prior to Admission medications   ?Medication Sig Start Date End Date Taking? Authorizing Provider  ?amLODipine (NORVASC) 10 MG tablet Take 1 tablet (10 mg total) by mouth daily. 01/21/22   Georganna Skeans, MD  ?hydrochlorothiazide (HYDRODIURIL) 25 MG tablet Take 1 tablet (25 mg total) by mouth daily. 10/25/21   Georganna Skeans, MD  ?omeprazole (PRILOSEC) 20 MG capsule Take 1 capsule (20 mg total) by mouth daily. 04/25/17 04/02/19  [name], MD  ?promethazine (PHENERGAN) 25 MG tablet Take 1 tablet (25 mg total) by mouth every 8 (eight) hours as needed for nausea or vomiting. 04/25/17 04/02/19  [name], MD  ?   ? ?Allergies    ?Doxycycline and Lisinopril   ? ?Review of Systems   ?Review of Systems  ?HENT:  Negative for hearing loss and tinnitus.   ?Respiratory:  Negative for shortness of breath.   ?Cardiovascular:  Negative for chest pain, palpitations and syncope.  ?Gastrointestinal:  Negative for blood in stool, diarrhea, nausea and vomiting.  ?Genitourinary:   Positive for hematuria.  ?Neurological:  Positive for dizziness and weakness. Negative for headaches.  ? ?Physical Exam ?Updated Vital Signs ?BP (!) 158/105 (BP Location: Right Arm)   Pulse (!) 102   Temp 99.1 ?F (37.3 ?C) (Oral)   Resp 18   Ht 6\' 2"  (1.88 m)   Wt 90.7 kg   SpO2 99%   BMI 25.68 kg/m?  ?Physical Exam ?Vitals and nursing note reviewed.  ?Constitutional:   ?   General: He is not in acute distress. ?   Appearance: He is well-developed. He is not ill-appearing.  ?HENT:  ?   Head: Normocephalic and atraumatic.  ?   Nose: Nose normal.  ?   Mouth/Throat:  ?   Mouth: Mucous membranes are moist.  ?Eyes:  ?   Extraocular Movements: Extraocular movements intact.  ?   Conjunctiva/sclera: Conjunctivae normal.  ?   Pupils: Pupils are equal, round, and reactive to light.  ?Cardiovascular:  ?   Rate and Rhythm: Normal rate and regular rhythm.  ?   Pulses: Normal pulses.  ?   Heart sounds: Normal heart sounds. No murmur heard. ?Pulmonary:  ?   Effort: Pulmonary effort is normal. No respiratory distress.  ?   Breath sounds: Normal breath sounds.  ?Abdominal:  ?   Palpations: Abdomen is soft.  ?   Tenderness: There is no abdominal tenderness.  ?Musculoskeletal:     ?   General: No swelling.  ?   Cervical back: Normal range of motion  and neck supple.  ?Skin: ?   General: Skin is warm and dry.  ?   Capillary Refill: Capillary refill takes less than 2 seconds.  ?Neurological:  ?   General: No focal deficit present.  ?   Mental Status: He is alert and oriented to person, place, and time.  ?   Cranial Nerves: No cranial nerve deficit.  ?   Sensory: No sensory deficit.  ?   Motor: No weakness.  ?   Coordination: Coordination normal.  ?Psychiatric:     ?   Mood and Affect: Mood normal.  ? ? ?ED Results / Procedures / Treatments   ?Labs ?(all labs ordered are listed, but only abnormal results are displayed) ?Labs Reviewed  ?BASIC METABOLIC PANEL - Abnormal; Notable for the following components:  ?    Result Value  ?  Sodium 133 (*)   ? Potassium 3.1 (*)   ? Chloride 97 (*)   ? All other components within normal limits  ?CBC - Abnormal; Notable for the following components:  ? RDW 16.2 (*)   ? All other components within normal limits  ?CBG MONITORING, ED - Abnormal; Notable for the following components:  ? Glucose-Capillary 129 (*)   ? All other components within normal limits  ?TROPONIN I (HIGH SENSITIVITY) - Abnormal; Notable for the following components:  ? Troponin I (High Sensitivity) 18 (*)   ? All other components within normal limits  ?URINALYSIS, ROUTINE W REFLEX MICROSCOPIC  ?TYPE AND SCREEN  ?TROPONIN I (HIGH SENSITIVITY)  ? ? ?EKG ?EKG Interpretation ? ?Date/Time:  Wednesday February 02 2022 12:51:02 EDT ?Ventricular Rate:  103 ?PR Interval:  158 ?QRS Duration: 124 ?QT Interval:  366 ?QTC Calculation: 479 ?R Axis:   -13 ?Text Interpretation: Sinus tachycardia Right atrial enlargement Abnormal ECG t wave inversion seen on prior EKGs, overall unchaged from EKG in 2022 When compared with ECG of 05-Feb-2021 17:51, PREVIOUS ECG IS PRESENT Confirmed by Virgina Norfolkuratolo, Shafter Jupin [phone] on 02/02/2022 4:36:46 PM ? ?Radiology ?DG Chest 1 View ? ?Result Date: 02/02/2022 ?CLINICAL DATA:  Shortness of breath EXAM: CHEST  1 VIEW COMPARISON:  Radiograph 05/22/2020 FINDINGS: The heart size and mediastinal contours are within normal limits.No focal airspace disease. No pleural effusion or pneumothorax.No acute osseous abnormality. IMPRESSION: No evidence of acute cardiopulmonary disease. Electronically Signed   By: Caprice RenshawJacob  Kahn M.D.   On: 02/02/2022 13:46   ? ?Procedures ?Procedures  ? ? ?Medications Ordered in ED ?Medications - No data to display ? ?ED Course/ Medical Decision Making/ A&P ?  ?                        ?Medical Decision Making ?Amount and/or Complexity of Data Reviewed ?Labs: ordered. ? ? ?Arta SilenceCharles Mccumbers is here with lightheadedness.  Normal vitals.  No fever.  May be some hematuria.  Denies any abdominal pain.  Denies any black stools  or bloody stools.  Denies any recent medication changes.  Denies any falls.  EKG per my review and interpretation shows sinus rhythm.  No ischemic changes.  He has some T wave inversions laterally which are consistent with prior EKGs.  Troponin at baseline x2.  Not having any chest pain.  Doubt ACS.  No concern for PE.  Chest x-ray per my review interpretation shows no evidence of pneumonia or pneumothorax.  Urinalysis negative for infection.  There is no blood in the urine.  Hemoglobin is unremarkable per my review and interpretation as well  as other lab work including no significant leukocytosis or AKI.  Patient overall asymptomatic upon my evaluation.  He states that he feels like after he takes his blood pressure medications he will feel lightheaded or when he is at work standing for a long time he feels lightheaded.  Possibly this is medication related orthostatic related.  He does drink a fair amount of alcohol.  Stressed with him the importance of nutrition and hydration.  At this time no concern for emergent process.  Discharged in good condition. ? ?This chart was dictated using voice recognition software.  Despite best efforts to proofread,  errors can occur which can change the documentation meaning.  ? ? ? ? ? ? ? ?Final Clinical Impression(s) / ED Diagnoses ?Final diagnoses:  ?Lightheadedness  ? ? ?Rx / DC Orders ?ED Discharge Orders   ? ? None  ? ?  ? ? ?  ?Virgina Norfolk, DO ?02/02/22 1651 ? ?

## 2022-02-02 NOTE — ED Triage Notes (Signed)
Pt arrived POV from home c/o hematuria x3 days and dizziness that started last night. Pt states it might be the weather change but he doesn't feel right.  ?

## 2022-02-02 NOTE — ED Provider Notes (Incomplete)
?[facility] ?Provider Note ? ? ?CSN: [id number] ?Arrival date & time: 02/02/22  1244 ? ?  ? ?History ?{Add pertinent medical, surgical, social history, OB history to HPI:1} ?Chief Complaint  ?Patient presents with  ? Dizziness  ? Hematuria  ? ? ?Cameron Rogers is a 56 y.o. male. ? ?The history is provided by the patient.  ?Dizziness ?Hematuria ? ? ?  ? ?Home Medications ?Prior to Admission medications   ?Medication Sig Start Date End Date Taking? Authorizing Provider  ?amLODipine (NORVASC) 10 MG tablet Take 1 tablet (10 mg total) by mouth daily. 01/21/22   Dorna Mai, MD  ?hydrochlorothiazide (HYDRODIURIL) 25 MG tablet Take 1 tablet (25 mg total) by mouth daily. 10/25/21   Dorna Mai, MD  ?omeprazole (PRILOSEC) 20 MG capsule Take 1 capsule (20 mg total) by mouth daily. 04/25/17 04/02/19  Davonna Belling, MD  ?promethazine (PHENERGAN) 25 MG tablet Take 1 tablet (25 mg total) by mouth every 8 (eight) hours as needed for nausea or vomiting. 04/25/17 04/02/19  Davonna Belling, MD  ?   ? ?Allergies    ?Doxycycline and Lisinopril   ? ?Review of Systems   ?Review of Systems  ?Genitourinary:  Positive for hematuria.  ?Neurological:  Positive for dizziness.  ? ?Physical Exam ?Updated Vital Signs ?BP (!) 158/105 (BP Location: Right Arm)   Pulse (!) 102   Temp 99.1 ?F (37.3 ?C) (Oral)   Resp 18   Ht 6\' 2"  (1.88 m)   Wt 90.7 kg   SpO2 99%   BMI 25.68 kg/m?  ?Physical Exam ? ?ED Results / Procedures / Treatments   ?Labs ?(all labs ordered are listed, but only abnormal results are displayed) ?Labs Reviewed  ?BASIC METABOLIC PANEL - Abnormal; Notable for the following components:  ?    Result Value  ? Sodium 133 (*)   ? Potassium 3.1 (*)   ? Chloride 97 (*)   ? All other components within normal limits  ?CBC - Abnormal; Notable for the following components:  ? RDW 16.2 (*)   ? All other components within normal limits  ?CBG MONITORING, ED - Abnormal; Notable for the following  components:  ? Glucose-Capillary 129 (*)   ? All other components within normal limits  ?TROPONIN I (HIGH SENSITIVITY) - Abnormal; Notable for the following components:  ? Troponin I (High Sensitivity) 18 (*)   ? All other components within normal limits  ?URINALYSIS, ROUTINE W REFLEX MICROSCOPIC  ?TYPE AND SCREEN  ?TROPONIN I (HIGH SENSITIVITY)  ? ? ?EKG ?EKG Interpretation ? ?Date/Time:  Wednesday February 02 2022 12:51:02 EDT ?Ventricular Rate:  103 ?PR Interval:  158 ?QRS Duration: 124 ?QT Interval:  366 ?QTC Calculation: 479 ?R Axis:   -13 ?Text Interpretation: Sinus tachycardia Right atrial enlargement Abnormal ECG t wave inversion seen on prior EKGs, overall unchaged from EKG in 2022 When compared with ECG of 05-Feb-2021 17:51, PREVIOUS ECG IS PRESENT Confirmed by Lennice Sites [phone]) on 02/02/2022 4:36:46 PM ? ?Radiology ?DG Chest 1 View ? ?Result Date: 02/02/2022 ?CLINICAL DATA:  Shortness of breath EXAM: CHEST  1 VIEW COMPARISON:  Radiograph 05/22/2020 FINDINGS: The heart size and mediastinal contours are within normal limits.No focal airspace disease. No pleural effusion or pneumothorax.No acute osseous abnormality. IMPRESSION: No evidence of acute cardiopulmonary disease. Electronically Signed   By: Maurine Simmering M.D.   On: 02/02/2022 13:46   ? ?Procedures ?Procedures  ?{Document cardiac monitor, telemetry assessment procedure when appropriate:1} ? ?Medications Ordered in ED ?Medications -  No data to display ? ?ED Course/ Medical Decision Making/ A&P ?  ?                        ?Medical Decision Making ?Amount and/or Complexity of Data Reviewed ?Labs: ordered. ? ? ?*** ? ?{Document critical care time when appropriate:1} ?{Document review of labs and clinical decision tools ie heart score, Chads2Vasc2 etc:1}  ?{Document your independent review of radiology images, and any outside records:1} ?{Document your discussion with family members, caretakers, and with consultants:1} ?{Document social determinants of  health affecting pt's care:1} ?{Document your decision making why or why not admission, treatments were needed:1} ?Final Clinical Impression(s) / ED Diagnoses ?Final diagnoses:  ?None  ? ? ?Rx / DC Orders ?ED Discharge Orders   ? ? None  ? ?  ? ? ?

## 2022-02-21 ENCOUNTER — Ambulatory Visit: Payer: Self-pay | Admitting: Family Medicine

## 2022-03-08 ENCOUNTER — Ambulatory Visit (INDEPENDENT_AMBULATORY_CARE_PROVIDER_SITE_OTHER): Payer: 59 | Admitting: Family Medicine

## 2022-03-08 ENCOUNTER — Encounter: Payer: Self-pay | Admitting: Family Medicine

## 2022-03-08 VITALS — BP 155/112 | HR 96 | Temp 98.3°F | Resp 16 | Wt 193.0 lb

## 2022-03-08 DIAGNOSIS — M79671 Pain in right foot: Secondary | ICD-10-CM | POA: Diagnosis not present

## 2022-03-08 DIAGNOSIS — R7303 Prediabetes: Secondary | ICD-10-CM | POA: Diagnosis not present

## 2022-03-08 DIAGNOSIS — M79672 Pain in left foot: Secondary | ICD-10-CM | POA: Diagnosis not present

## 2022-03-08 DIAGNOSIS — I10 Essential (primary) hypertension: Secondary | ICD-10-CM | POA: Diagnosis not present

## 2022-03-08 LAB — POCT GLYCOSYLATED HEMOGLOBIN (HGB A1C): Hemoglobin A1C: 5.9 % — AB (ref 4.0–5.6)

## 2022-03-08 MED ORDER — LOSARTAN POTASSIUM 50 MG PO TABS: 50.0000 mg | ORAL_TABLET | Freq: Every day | ORAL | 0 refills | Status: DC

## 2022-03-08 MED ORDER — HYDROCHLOROTHIAZIDE 25 MG PO TABS: 25.0000 mg | ORAL_TABLET | Freq: Every day | ORAL | 0 refills | Status: DC

## 2022-03-08 MED ORDER — AMLODIPINE BESYLATE 10 MG PO TABS: 10.0000 mg | ORAL_TABLET | Freq: Every day | ORAL | 0 refills | Status: DC

## 2022-03-08 MED ORDER — TRAMADOL HCL 50 MG PO TABS: 50.0000 mg | ORAL_TABLET | Freq: Four times a day (QID) | ORAL | 0 refills | Status: AC | PRN

## 2022-03-09 ENCOUNTER — Encounter: Payer: Self-pay | Admitting: Family Medicine

## 2022-03-09 NOTE — Progress Notes (Signed)
Established Patient Office Visit  Subjective    Patient ID: Cameron Rogers, male    DOB: Mar 13, 1966  Age: 56 y.o. MRN: [id number]  CC:  Chief Complaint  Patient presents with   Hypertension   Follow-up    HPI Cameron Rogers presents for routine follow up of chronic med issues including hypertension. Patient does reports that he has not been taking all of his BP meds as recommended.  Patient denies acute complaints or concerns.    Outpatient Encounter Medications as of 03/08/2022  Medication Sig   losartan (COZAAR) 50 MG tablet Take 1 tablet (50 mg total) by mouth daily.   traMADol (ULTRAM) 50 MG tablet Take 1 tablet (50 mg total) by mouth every 6 (six) hours as needed for up to 7 days.   [DISCONTINUED] amLODipine (NORVASC) 10 MG tablet Take 1 tablet (10 mg total) by mouth daily.   [DISCONTINUED] hydrochlorothiazide (HYDRODIURIL) 25 MG tablet Take 1 tablet (25 mg total) by mouth daily.   amLODipine (NORVASC) 10 MG tablet Take 1 tablet (10 mg total) by mouth daily.   hydrochlorothiazide (HYDRODIURIL) 25 MG tablet Take 1 tablet (25 mg total) by mouth daily.   [DISCONTINUED] omeprazole (PRILOSEC) 20 MG capsule Take 1 capsule (20 mg total) by mouth daily.   [DISCONTINUED] promethazine (PHENERGAN) 25 MG tablet Take 1 tablet (25 mg total) by mouth every 8 (eight) hours as needed for nausea or vomiting.   No facility-administered encounter medications on file as of 03/08/2022.    Past Medical History:  Diagnosis Date   Alcohol dependence (HCC)    Allergy    SEASONAL   Anemia    Arthritis    Heart murmur    Hypertension    Prediabetes    Unexplained weight loss 02/06/2021    Past Surgical History:  Procedure Laterality Date   NO PAST SURGERIES      Family History  Problem Relation Age of Onset   Diabetes Mother    Breast cancer Sister    Diabetes Sister    Heart murmur Sister    Diabetes Brother    Colon cancer Neg Hx    Colon polyps Neg Hx    Esophageal cancer Neg Hx     Rectal cancer Neg Hx    Stomach cancer Neg Hx    Ovarian cancer Neg Hx     Social History   Socioeconomic History   Marital status: Single    Spouse name: Not on file   Number of children: Not on file   Years of education: Not on file   Highest education level: Not on file  Occupational History   Occupation: Oceanographer  Tobacco Use   Smoking status: Every Day    Packs/day: 0.50    Years: 38.00    Pack years: 19.00    Types: Cigarettes   Smokeless tobacco: Never  Vaping Use   Vaping Use: Never used  Substance and Sexual Activity   Alcohol use: Not Currently   Drug use: No   Sexual activity: Yes  Other Topics Concern   Not on file  Social History Narrative   Not on file   Social Determinants of Health   Financial Resource Strain: Not on file  Food Insecurity: Not on file  Transportation Needs: Not on file  Physical Activity: Not on file  Stress: Not on file  Social Connections: Not on file  Intimate Partner Violence: Not on file    Review of Systems  All other systems reviewed  and are negative.      Objective    BP (!) 155/112   Pulse 96   Temp 98.3 F (36.8 C) (Oral)   Resp 16   Wt 193 lb (87.5 kg)   SpO2 96%   BMI 24.78 kg/m   Physical Exam Vitals and nursing note reviewed.  Constitutional:      General: He is not in acute distress. Cardiovascular:     Rate and Rhythm: Normal rate and regular rhythm.  Pulmonary:     Effort: Pulmonary effort is normal.     Breath sounds: Normal breath sounds.  Abdominal:     Palpations: Abdomen is soft.     Tenderness: There is no abdominal tenderness.  Musculoskeletal:     Right lower leg: No edema.     Left lower leg: No edema.  Neurological:     General: No focal deficit present.     Mental Status: He is alert and oriented to person, place, and time.        Assessment & Plan:   1. Prediabetes A1c is at goal. Continue and monitor - HgB A1c  2. Essential hypertension Elevated  readings. Compliance discussed. Meds refilled. Continue and monitor - amLODipine (NORVASC) 10 MG tablet; Take 1 tablet (10 mg total) by mouth daily.  Dispense: 90 tablet; Refill: 0  3. Pain in both feet Tramadol prescribed.     Return in about 5 weeks (around 04/12/2022) for follow up.   Tommie Raymond, MD

## 2022-04-12 ENCOUNTER — Ambulatory Visit (INDEPENDENT_AMBULATORY_CARE_PROVIDER_SITE_OTHER): Payer: 59 | Admitting: Family Medicine

## 2022-04-12 ENCOUNTER — Encounter: Payer: Self-pay | Admitting: Family Medicine

## 2022-04-12 DIAGNOSIS — I10 Essential (primary) hypertension: Secondary | ICD-10-CM | POA: Diagnosis not present

## 2022-04-13 ENCOUNTER — Encounter: Payer: Self-pay | Admitting: Family Medicine

## 2022-04-13 MED ORDER — HYDROCHLOROTHIAZIDE 25 MG PO TABS: 25.0000 mg | ORAL_TABLET | Freq: Every day | ORAL | 0 refills | Status: DC

## 2022-04-13 MED ORDER — LOSARTAN POTASSIUM 50 MG PO TABS: 50.0000 mg | ORAL_TABLET | Freq: Every day | ORAL | 0 refills | Status: DC

## 2022-04-13 MED ORDER — AMLODIPINE BESYLATE 10 MG PO TABS: 10.0000 mg | ORAL_TABLET | Freq: Every day | ORAL | 0 refills | Status: DC

## 2022-04-13 NOTE — Progress Notes (Signed)
Established Patient Office Visit  Subjective    Patient ID: Cameron Rogers, male    DOB: Mar 01, 1966  Age: 56 y.o. MRN: [id number]  CC:  Chief Complaint  Patient presents with   Hypertension    HPI Cameron Rogers presents to follow up of hypertension. Patient denies acute complaints or concerns.    Outpatient Encounter Medications as of 04/12/2022  Medication Sig   amLODipine (NORVASC) 10 MG tablet Take 1 tablet (10 mg total) by mouth daily.   hydrochlorothiazide (HYDRODIURIL) 25 MG tablet Take 1 tablet (25 mg total) by mouth daily.   losartan (COZAAR) 50 MG tablet Take 1 tablet (50 mg total) by mouth daily.   [DISCONTINUED] amLODipine (NORVASC) 10 MG tablet Take 1 tablet (10 mg total) by mouth daily.   [DISCONTINUED] hydrochlorothiazide (HYDRODIURIL) 25 MG tablet Take 1 tablet (25 mg total) by mouth daily.   [DISCONTINUED] losartan (COZAAR) 50 MG tablet Take 1 tablet (50 mg total) by mouth daily.   [DISCONTINUED] omeprazole (PRILOSEC) 20 MG capsule Take 1 capsule (20 mg total) by mouth daily.   [DISCONTINUED] promethazine (PHENERGAN) 25 MG tablet Take 1 tablet (25 mg total) by mouth every 8 (eight) hours as needed for nausea or vomiting.   No facility-administered encounter medications on file as of 04/12/2022.    Past Medical History:  Diagnosis Date   Alcohol dependence (HCC)    Allergy    SEASONAL   Anemia    Arthritis    Heart murmur    Hypertension    Prediabetes    Unexplained weight loss 02/06/2021    Past Surgical History:  Procedure Laterality Date   NO PAST SURGERIES      Family History  Problem Relation Age of Onset   Diabetes Mother    Breast cancer Sister    Diabetes Sister    Heart murmur Sister    Diabetes Brother    Colon cancer Neg Hx    Colon polyps Neg Hx    Esophageal cancer Neg Hx    Rectal cancer Neg Hx    Stomach cancer Neg Hx    Ovarian cancer Neg Hx     Social History   Socioeconomic History   Marital status: Single    Spouse  name: Not on file   Number of children: Not on file   Years of education: Not on file   Highest education level: Not on file  Occupational History   Occupation: Oceanographer  Tobacco Use   Smoking status: Every Day    Packs/day: 0.50    Years: 38.00    Total pack years: 19.00    Types: Cigarettes   Smokeless tobacco: Never  Vaping Use   Vaping Use: Never used  Substance and Sexual Activity   Alcohol use: Not Currently   Drug use: No   Sexual activity: Yes  Other Topics Concern   Not on file  Social History Narrative   Not on file   Social Determinants of Health   Financial Resource Strain: Not on file  Food Insecurity: Not on file  Transportation Needs: Not on file  Physical Activity: Not on file  Stress: Not on file  Social Connections: Not on file  Intimate Partner Violence: Not on file    Review of Systems  All other systems reviewed and are negative.       Objective    BP 128/79   Pulse 86   Temp 98.1 F (36.7 C) (Oral)   Resp 16  Wt 196 lb 3.2 oz (89 kg)   SpO2 97%   BMI 25.19 kg/m   Physical Exam Vitals and nursing note reviewed.  Constitutional:      General: He is not in acute distress. Cardiovascular:     Rate and Rhythm: Normal rate and regular rhythm.  Pulmonary:     Effort: Pulmonary effort is normal.     Breath sounds: Normal breath sounds.  Abdominal:     Palpations: Abdomen is soft.     Tenderness: There is no abdominal tenderness.  Musculoskeletal:     Right lower leg: No edema.     Left lower leg: No edema.  Neurological:     General: No focal deficit present.     Mental Status: He is alert and oriented to person, place, and time.         Assessment & Plan:   1. Essential hypertension Appears stable with present management. Continue and monitor. Meds refilled.  - amLODipine (NORVASC) 10 MG tablet; Take 1 tablet (10 mg total) by mouth daily.  Dispense: 90 tablet; Refill: 0    No follow-ups on file.    Tommie Raymond, MD

## 2022-06-15 ENCOUNTER — Emergency Department (HOSPITAL_COMMUNITY)
Admission: EM | Admit: 2022-06-15 | Discharge: 2022-06-15 | Disposition: A | Discharge status: Home / Self Care | Payer: 59 | Attending: Emergency Medicine | Admitting: Emergency Medicine

## 2022-06-15 ENCOUNTER — Encounter (HOSPITAL_COMMUNITY): Payer: Self-pay

## 2022-06-15 ENCOUNTER — Other Ambulatory Visit: Payer: Self-pay

## 2022-06-15 DIAGNOSIS — I10 Essential (primary) hypertension: Secondary | ICD-10-CM | POA: Diagnosis not present

## 2022-06-15 DIAGNOSIS — R112 Nausea with vomiting, unspecified: Secondary | ICD-10-CM | POA: Diagnosis not present

## 2022-06-15 DIAGNOSIS — R197 Diarrhea, unspecified: Secondary | ICD-10-CM | POA: Insufficient documentation

## 2022-06-15 DIAGNOSIS — Z79899 Other long term (current) drug therapy: Secondary | ICD-10-CM | POA: Insufficient documentation

## 2022-06-15 DIAGNOSIS — R21 Rash and other nonspecific skin eruption: Secondary | ICD-10-CM | POA: Diagnosis present

## 2022-06-15 DIAGNOSIS — I872 Venous insufficiency (chronic) (peripheral): Secondary | ICD-10-CM | POA: Insufficient documentation

## 2022-06-15 NOTE — ED Triage Notes (Signed)
Pt states he is here for a check up he wants to check his blood pressure. Pt has an area on his right ankle that looks like dried skin but states that he thinks it is a rash and has been there for months.

## 2022-06-15 NOTE — ED Provider Notes (Signed)
[facility] EMERGENCY DEPARTMENT Provider Note   CSN: [id number] Arrival date & time: 06/15/22  0820     History  Chief Complaint  Patient presents with   Rash    Cameron Rogers is a 56 y.o. male.   Rash Patient presents with request to get a work note to be able to go back to work.  Had had some thrombin diarrhea over the last few days.  States that he needs a note saying it is okay to go back to work.  Patient has not had diarrhea in the last couple days.  No real abdominal pain.  No blood in stool.  No known sick contacts.  No fevers.  I think it is okay to clear him. Also has hypertension.  Is on blood pressure medicines he is been taking.  No chest pain.  No trouble breathing.  States he is takes his medicine in the afternoon. Patient also has rash on his right medial lower leg.  States been there for months.  Just wonders a little bit about it.  Does not hurt.    Past Medical History:  Diagnosis Date   Alcohol dependence (HCC)    Allergy    SEASONAL   Anemia    Arthritis    Heart murmur    Hypertension    Prediabetes    Unexplained weight loss 02/06/2021    Home Medications Prior to Admission medications   Medication Sig Start Date End Date Taking? Authorizing Provider  amLODipine (NORVASC) 10 MG tablet Take 1 tablet (10 mg total) by mouth daily. 04/13/22   Georganna Skeans, MD  hydrochlorothiazide (HYDRODIURIL) 25 MG tablet Take 1 tablet (25 mg total) by mouth daily. 04/13/22   Georganna Skeans, MD  losartan (COZAAR) 50 MG tablet Take 1 tablet (50 mg total) by mouth daily. 04/13/22   Georganna Skeans, MD  omeprazole (PRILOSEC) 20 MG capsule Take 1 capsule (20 mg total) by mouth daily. 04/25/17 04/02/19  [name], MD  promethazine (PHENERGAN) 25 MG tablet Take 1 tablet (25 mg total) by mouth every 8 (eight) hours as needed for nausea or vomiting. 04/25/17 04/02/19  [name], MD      Allergies    Doxycycline and Lisinopril    Review of  Systems   Review of Systems  Skin:  Positive for rash.    Physical Exam Updated Vital Signs BP (!) 153/105 (BP Location: Right Arm)   Pulse 97   Temp 98.6 F (37 C) (Oral)   Resp 18   Ht 6\' 2"  (1.88 m)   Wt 88.9 kg   SpO2 100%   BMI 25.16 kg/m  Physical Exam Vitals and nursing note reviewed.  Cardiovascular:     Rate and Rhythm: Regular rhythm.  Pulmonary:     Breath sounds: No wheezing.  Abdominal:     Tenderness: There is no abdominal tenderness.  Musculoskeletal:        General: No tenderness.     Cervical back: Neck supple.  Neurological:     Mental Status: He is alert.     ED Results / Procedures / Treatments   Labs (all labs ordered are listed, but only abnormal results are displayed) Labs Reviewed - No data to display  EKG None  Radiology No results found.  Procedures Procedures    Medications Ordered in ED Medications - No data to display  ED Course/ Medical Decision Making/ A&P  Medical Decision Making Patient presented with previous nausea vomiting diarrhea.  States he needs a note to go back to work.  Has not had episodes in a couple days.  I think it is reasonable to clear him to go back. Also hypertension.  History of same.  No chest pain or trouble breathing.  Can follow as an outpatient.  Has not taken his medicine yet today and that he takes it in the afternoon. Also chronic changes on the right lower leg.  Appears more of a venous insufficiency.  Does not appear infected.  Can follow-up as an outpatient also.  Will discharge.         Final Clinical Impression(s) / ED Diagnoses Final diagnoses:  Venous (peripheral) insufficiency  Essential hypertension    Rx / DC Orders ED Discharge Orders     None         [name], MD 06/15/22 [phone]

## 2022-08-09 ENCOUNTER — Emergency Department (HOSPITAL_COMMUNITY)
Admission: EM | Admit: 2022-08-09 | Discharge: 2022-08-09 | Disposition: A | Discharge status: Home / Self Care | Payer: Self-pay | Attending: Emergency Medicine | Admitting: Emergency Medicine

## 2022-08-09 ENCOUNTER — Encounter (HOSPITAL_COMMUNITY): Payer: Self-pay | Admitting: Emergency Medicine

## 2022-08-09 ENCOUNTER — Emergency Department (HOSPITAL_COMMUNITY): Payer: Self-pay

## 2022-08-09 ENCOUNTER — Other Ambulatory Visit: Payer: Self-pay

## 2022-08-09 ENCOUNTER — Ambulatory Visit: Payer: Self-pay | Admitting: Family Medicine

## 2022-08-09 DIAGNOSIS — I10 Essential (primary) hypertension: Secondary | ICD-10-CM | POA: Insufficient documentation

## 2022-08-09 DIAGNOSIS — K760 Fatty (change of) liver, not elsewhere classified: Secondary | ICD-10-CM | POA: Insufficient documentation

## 2022-08-09 DIAGNOSIS — R109 Unspecified abdominal pain: Secondary | ICD-10-CM

## 2022-08-09 DIAGNOSIS — Z1152 Encounter for screening for COVID-19: Secondary | ICD-10-CM | POA: Insufficient documentation

## 2022-08-09 DIAGNOSIS — R5383 Other fatigue: Secondary | ICD-10-CM | POA: Insufficient documentation

## 2022-08-09 DIAGNOSIS — K529 Noninfective gastroenteritis and colitis, unspecified: Secondary | ICD-10-CM | POA: Insufficient documentation

## 2022-08-09 DIAGNOSIS — R1030 Lower abdominal pain, unspecified: Secondary | ICD-10-CM | POA: Insufficient documentation

## 2022-08-09 DIAGNOSIS — Z79899 Other long term (current) drug therapy: Secondary | ICD-10-CM | POA: Insufficient documentation

## 2022-08-09 DIAGNOSIS — N400 Enlarged prostate without lower urinary tract symptoms: Secondary | ICD-10-CM | POA: Insufficient documentation

## 2022-08-09 LAB — URINALYSIS, ROUTINE W REFLEX MICROSCOPIC
Bilirubin Urine: NEGATIVE
Glucose, UA: 50 mg/dL — AB
Hgb urine dipstick: NEGATIVE
Ketones, ur: 20 mg/dL — AB
Leukocytes,Ua: NEGATIVE
Nitrite: NEGATIVE
Protein, ur: 100 mg/dL — AB
Specific Gravity, Urine: 1.021 (ref 1.005–1.030)
pH: 5 (ref 5.0–8.0)

## 2022-08-09 LAB — CBC
HCT: 47.3 % (ref 39.0–52.0)
Hemoglobin: 16.5 g/dL (ref 13.0–17.0)
MCH: 30.4 pg (ref 26.0–34.0)
MCHC: 34.9 g/dL (ref 30.0–36.0)
MCV: 87.1 fL (ref 80.0–100.0)
Platelets: 181 10*3/uL (ref 150–400)
RBC: 5.43 MIL/uL (ref 4.22–5.81)
RDW: 14.4 % (ref 11.5–15.5)
WBC: 5.5 10*3/uL (ref 4.0–10.5)
nRBC: 0 % (ref 0.0–0.2)

## 2022-08-09 LAB — RESP PANEL BY RT-PCR (FLU A&B, COVID) ARPGX2
Influenza A by PCR: NEGATIVE
Influenza B by PCR: NEGATIVE
SARS Coronavirus 2 by RT PCR: NEGATIVE

## 2022-08-09 LAB — COMPREHENSIVE METABOLIC PANEL
ALT: 72 U/L — ABNORMAL HIGH (ref 0–44)
AST: 192 U/L — ABNORMAL HIGH (ref 15–41)
Albumin: 4 g/dL (ref 3.5–5.0)
Alkaline Phosphatase: 89 U/L (ref 38–126)
Anion gap: 13 (ref 5–15)
BUN: 5 mg/dL — ABNORMAL LOW (ref 6–20)
CO2: 22 mmol/L (ref 22–32)
Calcium: 9.6 mg/dL (ref 8.9–10.3)
Chloride: 102 mmol/L (ref 98–111)
Creatinine, Ser: 0.86 mg/dL (ref 0.61–1.24)
GFR, Estimated: >60 mL/min (ref >60)
Glucose, Bld: 163 mg/dL — ABNORMAL HIGH (ref 70–99)
Potassium: 3.8 mmol/L (ref 3.5–5.1)
Sodium: 137 mmol/L (ref 135–145)
Total Bilirubin: 1.1 mg/dL (ref 0.3–1.2)
Total Protein: 7.4 g/dL (ref 6.5–8.1)

## 2022-08-09 LAB — LIPASE, BLOOD: Lipase: 30 U/L (ref 11–51)

## 2022-08-09 MED ORDER — HYDROCHLOROTHIAZIDE 25 MG PO TABS
25.0000 mg | ORAL_TABLET | Freq: Once | ORAL | Status: AC
  Administered 2022-08-09: 25 mg via ORAL

## 2022-08-09 MED ORDER — IOHEXOL 350 MG/ML SOLN
75.0000 mL | Freq: Once | INTRAVENOUS | Status: AC | PRN
  Administered 2022-08-09: 75 mL via INTRAVENOUS

## 2022-08-09 MED ORDER — OMEPRAZOLE 20 MG PO CPDR: 20.0000 mg | DELAYED_RELEASE_CAPSULE | Freq: Every day | ORAL | 0 refills | Status: DC

## 2022-08-09 MED ORDER — HYDROMORPHONE HCL 1 MG/ML IJ SOLN
1.0000 mg | Freq: Once | INTRAMUSCULAR | Status: AC
  Administered 2022-08-09: 1 mg via INTRAVENOUS
  Filled 2022-08-09: qty 1

## 2022-08-09 MED ORDER — AMLODIPINE BESYLATE 5 MG PO TABS
ORAL_TABLET | ORAL | Status: AC
  Filled 2022-08-09: qty 2

## 2022-08-09 MED ORDER — DICYCLOMINE HCL 20 MG PO TABS: 20.0000 mg | ORAL_TABLET | Freq: Two times a day (BID) | ORAL | 0 refills | Status: DC

## 2022-08-09 MED ORDER — PANTOPRAZOLE SODIUM 40 MG IV SOLR
40.0000 mg | Freq: Once | INTRAVENOUS | Status: AC
  Administered 2022-08-09: 40 mg via INTRAVENOUS
  Filled 2022-08-09: qty 10

## 2022-08-09 MED ORDER — HYDROCHLOROTHIAZIDE 12.5 MG PO TABS
ORAL_TABLET | ORAL | Status: AC
  Filled 2022-08-09: qty 2

## 2022-08-09 MED ORDER — AMLODIPINE BESYLATE 5 MG PO TABS
10.0000 mg | ORAL_TABLET | Freq: Once | ORAL | Status: AC
  Administered 2022-08-09: 10 mg via ORAL

## 2022-08-09 MED ORDER — ONDANSETRON HCL 4 MG/2ML IJ SOLN
4.0000 mg | Freq: Once | INTRAMUSCULAR | Status: AC
  Administered 2022-08-09: 4 mg via INTRAVENOUS
  Filled 2022-08-09: qty 2

## 2022-08-09 MED ORDER — LACTATED RINGERS IV BOLUS
1000.0000 mL | Freq: Once | INTRAVENOUS | Status: AC
  Administered 2022-08-09: 1000 mL via INTRAVENOUS

## 2022-08-09 MED ORDER — ONDANSETRON 4 MG PO TBDP: 4.0000 mg | ORAL_TABLET | Freq: Three times a day (TID) | ORAL | 0 refills | Status: DC | PRN

## 2022-08-09 NOTE — Discharge Instructions (Signed)
  You have been seen in the Emergency Department (ED) for abdominal pain, nausea and diarrhea. Your workup was generally reassuring; however, it did not identify a clear cause of your symptoms.  Please follow up with your primary care doctor as soon as possible regarding today's ED visit and the symptoms that are bothering you.  Continue to take the medicines as prescribed for abdominal pain and nausea.  Continue to drink plenty of nonalcoholic fluids.  Return to the ED if your abdominal pain worsens or fails to improve, you develop bloody vomiting, bloody diarrhea, you are unable to tolerate fluids due to vomiting, fever greater than 101, or any other concerning symptoms.

## 2022-08-09 NOTE — ED Provider Notes (Signed)
[facility] EMERGENCY DEPARTMENT Provider Note   CSN: [id number] Arrival date & time: 08/09/22  1043     History  Chief Complaint  Patient presents with   Abdominal Pain   Vomiting   Emesis    Cameron Rogers is a 56 y.o. male.  With PMH of HTN, prediabetes, anemia, alcohol dependence who presents with abdominal pain, nausea and nonbloody diarrhea.  Patient said he started feeling unwell this past Friday 5 days prior to presenting today with generalized fatigue.  He thought he may have had the flu.  He developed abdominal pain and cramping mainly in his lower abdomen with associated nausea and retching but no vomiting.  He has had also associated multiple episodes of nonbloody chocolate brown diarrhea.  He denies any melena or bright red blood per rectum.  He has had no fevers, chest pain, shortness of breath or URI symptoms.  He has never had abdominal surgery.  No recent antibiotics.  He takes blood pressure medications but has not taken them today.  He has had no sick contacts.  He last drank a 40 yesterday and has had no alcohol use today.  Denies history of alcohol withdrawal seizure.   Abdominal Pain Associated symptoms: vomiting   Emesis Associated symptoms: abdominal pain        Home Medications Prior to Admission medications   Medication Sig Start Date End Date Taking? Authorizing Provider  dicyclomine (BENTYL) 20 MG tablet Take 1 tablet (20 mg total) by mouth 2 (two) times daily. 08/09/22  Yes Mardene Sayer, MD  omeprazole (PRILOSEC) 20 MG capsule Take 1 capsule (20 mg total) by mouth daily. 08/09/22 09/08/22 Yes Mardene Sayer, MD  ondansetron (ZOFRAN-ODT) 4 MG disintegrating tablet Take 1 tablet (4 mg total) by mouth every 8 (eight) hours as needed for nausea or vomiting. 08/09/22  Yes Mardene Sayer, MD  amLODipine (NORVASC) 10 MG tablet Take 1 tablet (10 mg total) by mouth daily. 04/13/22   Georganna Skeans, MD  hydrochlorothiazide  (HYDRODIURIL) 25 MG tablet Take 1 tablet (25 mg total) by mouth daily. 04/13/22   Georganna Skeans, MD  losartan (COZAAR) 50 MG tablet Take 1 tablet (50 mg total) by mouth daily. 04/13/22   Georganna Skeans, MD  promethazine (PHENERGAN) 25 MG tablet Take 1 tablet (25 mg total) by mouth every 8 (eight) hours as needed for nausea or vomiting. 04/25/17 04/02/19  [name], MD      Allergies    Doxycycline and Lisinopril    Review of Systems   Review of Systems  Gastrointestinal:  Positive for abdominal pain and vomiting.    Physical Exam Updated Vital Signs BP (!) 156/96   Pulse 74   Temp 98.2 F (36.8 C)   Resp 16   SpO2 100%  Physical Exam Constitutional: Alert and oriented.  Chronically ill-appearing but nontoxic  eyes: PERRL ENT      Head: Normocephalic and atraumatic. Cardiovascular: S1, S2, borderline tachycardic, normal and symmetric distal pulses are present in all extremities.Warm and well perfused. Respiratory: Normal respiratory effort. Breath sounds are normal.  O2 sat 100% on room air Gastrointestinal: Soft and distended with diffuse tenderness on exam worse in the epigastrium, no rebound, no guarding, no surgical scars Musculoskeletal: Normal range of motion in all extremities. No pitting edema of the lower extremities Neurologic: Normal speech and language.  No facial droop.  EOMI.  Moving extremities bilaterally upper and lower equally.  Sensation grossly intact.  No gross focal neurologic  deficits are appreciated. Skin: Skin is warm, dry Psychiatric: Mood and affect are normal. Speech and behavior are normal.  ED Results / Procedures / Treatments   Labs (all labs ordered are listed, but only abnormal results are displayed) Labs Reviewed  COMPREHENSIVE METABOLIC PANEL - Abnormal; Notable for the following components:      Result Value   Glucose, Bld 163 (*)    BUN 5 (*)    AST 192 (*)    ALT 72 (*)    All other components within normal limits  URINALYSIS,  ROUTINE W REFLEX MICROSCOPIC - Abnormal; Notable for the following components:   Color, Urine AMBER (*)    APPearance HAZY (*)    Glucose, UA 50 (*)    Ketones, ur 20 (*)    Protein, ur 100 (*)    Bacteria, UA RARE (*)    All other components within normal limits  RESP PANEL BY RT-PCR (FLU A&B, COVID) ARPGX2  LIPASE, BLOOD  CBC    EKG None  Radiology CT ABDOMEN PELVIS W CONTRAST  Result Date: 08/09/2022 CLINICAL DATA:  Lower abdominal pain. EXAM: CT ABDOMEN AND PELVIS WITH CONTRAST TECHNIQUE: Multidetector CT imaging of the abdomen and pelvis was performed using the standard protocol following bolus administration of intravenous contrast. RADIATION DOSE REDUCTION: This exam was performed according to the departmental dose-optimization program which includes automated exposure control, adjustment of the mA and/or kV according to patient size and/or use of iterative reconstruction technique. CONTRAST:  71mL OMNIPAQUE IOHEXOL 350 MG/ML SOLN COMPARISON:  February 05, 2021 FINDINGS: Lower chest: No acute abnormality. Hepatobiliary: There is diffuse fatty infiltration of the liver parenchyma. No focal liver abnormality is seen. No gallstones, gallbladder wall thickening, or biliary dilatation. Pancreas: Unremarkable. No pancreatic ductal dilatation or surrounding inflammatory changes. Spleen: Normal in size without focal abnormality. Adrenals/Urinary Tract: Adrenal glands are unremarkable. Kidneys are normal, without renal calculi, focal lesion, or hydronephrosis. The urinary bladder is markedly distended. Stomach/Bowel: The stomach is poorly distended and subsequently limited in evaluation. Persistent diffuse gastric wall thickening is seen. Appendix appears normal. No evidence of bowel wall thickening, distention, or inflammatory changes. Noninflamed diverticula are seen throughout the large bowel. Vascular/Lymphatic: No significant vascular findings are present. No enlarged abdominal or pelvic lymph  nodes. Reproductive: There is stable moderate severity prostatomegaly (5.0 cm x 6.1 cm x 4.5 cm). Other: A 2.9 cm x 1.5 cm fat containing right inguinal hernia is seen. An additional 2.4 cm x 1.1 cm fat containing left inguinal hernia is also noted. No abdominopelvic ascites. Musculoskeletal: No acute or significant osseous findings. IMPRESSION: 1. Hepatic steatosis. 2. Colonic diverticulosis. 3. Stable moderate severity prostatomegaly. 4. Bilateral fat containing inguinal hernias. Electronically Signed   By: Virgina Norfolk M.D.   On: 08/09/2022 22:16    Procedures Procedures  Remain on constant cardiac monitoring which I personally reviewed, normal sinus rhythm  Medications Ordered in ED Medications  HYDROmorphone (DILAUDID) injection 1 mg (1 mg Intravenous Given 08/09/22 2042)  ondansetron (ZOFRAN) injection 4 mg (4 mg Intravenous Given 08/09/22 2039)  lactated ringers bolus 1,000 mL (0 mLs Intravenous Stopped 08/09/22 2219)  pantoprazole (PROTONIX) injection 40 mg (40 mg Intravenous Given 08/09/22 2039)  amLODipine (NORVASC) tablet 10 mg (10 mg Oral Given 08/09/22 2225)  hydrochlorothiazide (HYDRODIURIL) tablet 25 mg (25 mg Oral Given 08/09/22 2225)  iohexol (OMNIPAQUE) 350 MG/ML injection 75 mL (75 mLs Intravenous Contrast Given 08/09/22 2206)    ED Course/ Medical Decision Making/ A&P  Medical Decision Making Cameron Rogers is a 56 y.o. male.  With PMH of HTN, prediabetes, anemia, alcohol dependence who presents with abdominal pain, nausea and nonbloody diarrhea.   Based on patient's presentation and history and physical exam, consider gastroenteritis versus diverticulitis versus enteritis vs gastritis or colitis.  SBO unlikely since he has no surgical history and is still passing gas and having bowel movements with no active vomiting on exam.  Unlikely upper GI bleed with no melena or hematochezia and normal hemoglobin on blood results today with hemoglobin  16.5.  Labs were obtained which I personally reviewed he has a normal white blood cell count 5.5, normal lipase 30 unlikely pancreatitis.  He has a stable transaminitis AST 192, ALT 72 with normal total bilirubin 1.1.  Suspect this is likely secondary to chronic alcohol use and fatty liver disease.  Glucose 163 with normal bicarbonate 22, no concern for DKA.  Patient was given IV fluids, IV Dilaudid, IV Zofran and IV Protonix.  CTAP with IV contrast of the abdomen and pelvis was obtained which showed diverticulosis without diverticulitis, hepatic steatosis and stable prostatomegaly.  Patient's pain and symptoms improved with IV medications administered today.  I suspect his symptoms are still likely related to a viral gastroenteritis.  Advised continued fluid hydration with nonalcoholic fluids, prescribe Zofran as needed for nausea or vomiting, omeprazole for gastritis and Bentyl for abdominal cramping.  Strict return precaution discussed and advised close follow-up with PCP.  He does not require admission today since he is tolerating p.o. and there were no acute concerning findings on blood work or CT.  Amount and/or Complexity of Data Reviewed Labs: ordered. Radiology: ordered.  Risk Prescription drug management.    Final Clinical Impression(s) / ED Diagnoses Final diagnoses:  Abdominal pain, unspecified abdominal location  Enlarged prostate  Hepatic steatosis  Gastroenteritis    Rx / DC Orders ED Discharge Orders          Ordered    ondansetron (ZOFRAN-ODT) 4 MG disintegrating tablet  Every 8 hours PRN        08/09/22 2224    omeprazole (PRILOSEC) 20 MG capsule  Daily        08/09/22 2224    dicyclomine (BENTYL) 20 MG tablet  2 times daily        08/09/22 2224              Mardene Sayer, MD 08/09/22 2227

## 2022-08-09 NOTE — ED Provider Triage Note (Signed)
Emergency Medicine Provider Triage Evaluation Note  Cameron Rogers , a 56 y.o. male  was evaluated in triage.  Pt complains of abdominal pain since Friday last week.  Patient states he has had intermittent burning station around his umbilical area.  Reports having loose stool twice a day in the last few days.  Endorse nausea without vomiting.  Denies fever, chest pain, shortness of breath, rash, urinary symptoms, dark stools.  Review of Systems  Positive: As above Negative: As above  Physical Exam  BP (!) 173/107 (BP Location: Right Arm)   Pulse (!) 105   Temp 100.3 F (37.9 C) (Oral)   Resp 16   SpO2 100%  Gen:   Awake, no distress   Resp:  Normal effort  MSK:   Moves extremities without difficulty  Other:    Medical Decision Making  Medically screening exam initiated at 12:00 PM.  Appropriate orders placed.  Cameron Rogers was informed that the remainder of the evaluation will be completed by another provider, this initial triage assessment does not replace that evaluation, and the importance of remaining in the ED until their evaluation is complete.  Work-up initiated.   Rex Kras, [place] 08/09/22 1202

## 2022-08-09 NOTE — ED Triage Notes (Signed)
Pt states he started having lower abd burning on Friday, then devleoped n/v/d. This has persisted through today. Pt has been able to eat/drink small amounts.

## 2022-09-15 ENCOUNTER — Emergency Department (HOSPITAL_COMMUNITY): Payer: Self-pay

## 2022-09-15 ENCOUNTER — Emergency Department (HOSPITAL_COMMUNITY)
Admission: EM | Admit: 2022-09-15 | Discharge: 2022-09-15 | Disposition: A | Discharge status: Home / Self Care | Payer: Self-pay | Attending: Emergency Medicine | Admitting: Emergency Medicine

## 2022-09-15 ENCOUNTER — Other Ambulatory Visit: Payer: Self-pay

## 2022-09-15 DIAGNOSIS — J101 Influenza due to other identified influenza virus with other respiratory manifestations: Secondary | ICD-10-CM | POA: Insufficient documentation

## 2022-09-15 DIAGNOSIS — R6889 Other general symptoms and signs: Secondary | ICD-10-CM

## 2022-09-15 DIAGNOSIS — Z20822 Contact with and (suspected) exposure to covid-19: Secondary | ICD-10-CM | POA: Insufficient documentation

## 2022-09-15 LAB — RESP PANEL BY RT-PCR (FLU A&B, COVID) ARPGX2
Influenza A by PCR: POSITIVE — AB
Influenza B by PCR: NEGATIVE
SARS Coronavirus 2 by RT PCR: NEGATIVE

## 2022-09-15 MED ORDER — ACETAMINOPHEN 500 MG PO TABS
1000.0000 mg | ORAL_TABLET | Freq: Once | ORAL | Status: AC
  Administered 2022-09-15: 1000 mg via ORAL
  Filled 2022-09-15: qty 2

## 2022-09-15 NOTE — ED Triage Notes (Signed)
Pt. Stated, I've had cough, fever, congestion, and body aches since yesterday.

## 2022-09-15 NOTE — Discharge Instructions (Signed)
Use Tylenol every 4 hours as needed for pain or fever. Stay well-hydrated.  Work note provided.

## 2022-09-15 NOTE — ED Provider Notes (Signed)
[facility] EMERGENCY DEPARTMENT Provider Note   CSN: [id number] Arrival date & time: 09/15/22  1046     History  Chief Complaint  Patient presents with   Cough   Nasal Congestion   Diarrhea   Fever    Cameron Rogers is a 56 y.o. male.  Patient presents with cough congestion fever body aches mild diarrhea since yesterday.  No significant sick contacts.  Low-grade fever.  No chest pain or shortness of breath.  Patient does work around people.       Home Medications Prior to Admission medications   Medication Sig Start Date End Date Taking? Authorizing Provider  amLODipine (NORVASC) 10 MG tablet Take 1 tablet (10 mg total) by mouth daily. 04/13/22   Georganna Skeans, MD  dicyclomine (BENTYL) 20 MG tablet Take 1 tablet (20 mg total) by mouth 2 (two) times daily. 08/09/22   Mardene Sayer, MD  hydrochlorothiazide (HYDRODIURIL) 25 MG tablet Take 1 tablet (25 mg total) by mouth daily. 04/13/22   Georganna Skeans, MD  losartan (COZAAR) 50 MG tablet Take 1 tablet (50 mg total) by mouth daily. 04/13/22   Georganna Skeans, MD  omeprazole (PRILOSEC) 20 MG capsule Take 1 capsule (20 mg total) by mouth daily. 08/09/22 09/08/22  Mardene Sayer, MD  ondansetron (ZOFRAN-ODT) 4 MG disintegrating tablet Take 1 tablet (4 mg total) by mouth every 8 (eight) hours as needed for nausea or vomiting. 08/09/22   Mardene Sayer, MD  promethazine (PHENERGAN) 25 MG tablet Take 1 tablet (25 mg total) by mouth every 8 (eight) hours as needed for nausea or vomiting. 04/25/17 04/02/19  [name], MD      Allergies    Doxycycline and Lisinopril    Review of Systems   Review of Systems  Constitutional:  Positive for appetite change and fever. Negative for chills.  HENT:  Positive for congestion.   Eyes:  Negative for visual disturbance.  Respiratory:  Positive for cough. Negative for shortness of breath.   Cardiovascular:  Negative for chest pain.  Gastrointestinal:   Negative for abdominal pain and vomiting.  Genitourinary:  Negative for dysuria and flank pain.  Musculoskeletal:  Negative for back pain, neck pain and neck stiffness.  Skin:  Negative for rash.  Neurological:  Negative for light-headedness and headaches.    Physical Exam Updated Vital Signs BP (!) 148/75   Pulse 93   Temp 98.9 F (37.2 C)   Resp 19   Ht 6\' 2"  (1.88 m)   Wt 77.1 kg   SpO2 98%   BMI 21.83 kg/m  Physical Exam Vitals and nursing note reviewed.  Constitutional:      General: He is not in acute distress.    Appearance: He is well-developed.  HENT:     Head: Normocephalic and atraumatic.     Nose: Congestion present.     Mouth/Throat:     Mouth: Mucous membranes are moist.  Eyes:     General:        Right eye: No discharge.        Left eye: No discharge.     Conjunctiva/sclera: Conjunctivae normal.  Neck:     Trachea: No tracheal deviation.  Cardiovascular:     Rate and Rhythm: Normal rate and regular rhythm.  Pulmonary:     Effort: Pulmonary effort is normal.     Breath sounds: Normal breath sounds.  Abdominal:     General: There is no distension.     Palpations:  Abdomen is soft.     Tenderness: There is no abdominal tenderness. There is no guarding.  Musculoskeletal:     Cervical back: Normal range of motion and neck supple. No rigidity.  Skin:    General: Skin is warm.     Capillary Refill: Capillary refill takes less than 2 seconds.     Findings: No rash.  Neurological:     General: No focal deficit present.     Mental Status: He is alert.     Cranial Nerves: No cranial nerve deficit.  Psychiatric:        Mood and Affect: Mood normal.     ED Results / Procedures / Treatments   Labs (all labs ordered are listed, but only abnormal results are displayed) Labs Reviewed  RESP PANEL BY RT-PCR (FLU A&B, COVID) [id number]    EKG None  Radiology DG Chest 2 View  Result Date: 09/15/2022 CLINICAL DATA:  Cough EXAM: CHEST - 2 VIEW COMPARISON:   Radiograph 02/02/2022 FINDINGS: Unchanged cardiomediastinal silhouette. No focal airspace consolidation. No pleural effusion or evidence of pneumothorax. No acute osseous abnormality. Mild thoracic spondylosis. IMPRESSION: No evidence of acute cardiopulmonary disease. Electronically Signed   By: Caprice Renshaw M.D.   On: 09/15/2022 11:36    Procedures Procedures    Medications Ordered in ED Medications  acetaminophen (TYLENOL) tablet 1,000 mg (1,000 mg Oral Given 09/15/22 1108)    ED Course/ Medical Decision Making/ A&P                           Medical Decision Making  Patient presents with viral/flulike illness.  Lungs are clear, chest x-ray ordered and apparently reviewed due to age and worsening symptoms no infiltrate or pleural effusion no signs of bacterial pneumonia at this time.  Viral testing sent and delay discussed follow-up of results on MyChart.  Work note given and patient stable for discharge.  Vital signs reviewed overall unremarkable mild elevated blood pressure and patient has known high blood pressure.        Final Clinical Impression(s) / ED Diagnoses Final diagnoses:  Flu-like symptoms    Rx / DC Orders ED Discharge Orders     None         Blane Ohara, MD 09/15/22 1406

## 2022-09-15 NOTE — ED Provider Triage Note (Signed)
Emergency Medicine Provider Triage Evaluation Note  Cameron Rogers , a 56 y.o. male  was evaluated in triage.  Pt complains of cough and runny nose.  Symptoms started yesterday but he felt worse this morning.  He reports some associated headache and body aches.  Noted to be febrile on arrival but reports he had not checked his temperature prior to arrival.  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  Review of Systems  Positive: Cough, nasal congestion, fever, headache, body aches Negative: Chest pain, shortness of breath, abdominal pain, vomiting  Physical Exam  BP (!) 148/84 (BP Location: Right Arm)   Pulse (!) 101   Temp (!) 102.4 F (39.1 C) (Oral)   Resp 18   SpO2 100%  Gen:   Awake, no distress   Resp:  Normal effort, CTA bilat MSK:   Moves extremities without difficulty  Other:    Medical Decision Making  Medically screening exam initiated at 10:54 AM.  Appropriate orders placed.  Mattthew Ziomek was informed that the remainder of the evaluation will be completed by another provider, this initial triage assessment does not replace that evaluation, and the importance of remaining in the ED until their evaluation is complete.     Dartha Lodge, [place] 09/15/22 1103

## 2022-10-07 IMAGING — CT CT HEAD W/O CM
3 series · 16 of 47 positions shown, 19 images · non-contrast
Comparison: None.

CLINICAL DATA: Vertigo

EXAM:
CT HEAD WITHOUT CONTRAST
TECHNIQUE: Contiguous axial images were obtained from the base of the skull
through the vertex without intravenous contrast.

[Series 2: head wo · axial · 0.46mm/px · z∈[-65,+60]mm · 10 of 31 slices shown, 13 images]
[im 3/31  brain]
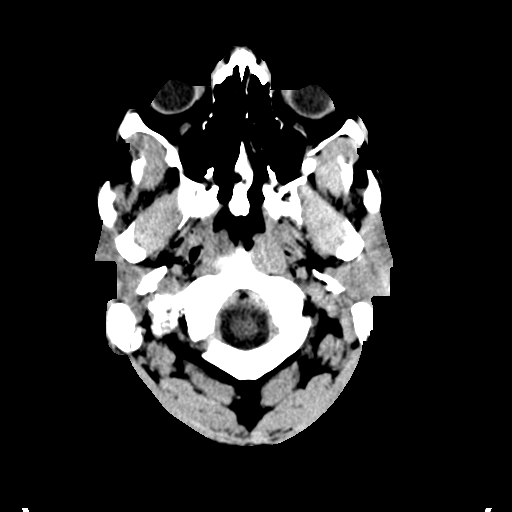
[im 3/31  bone]
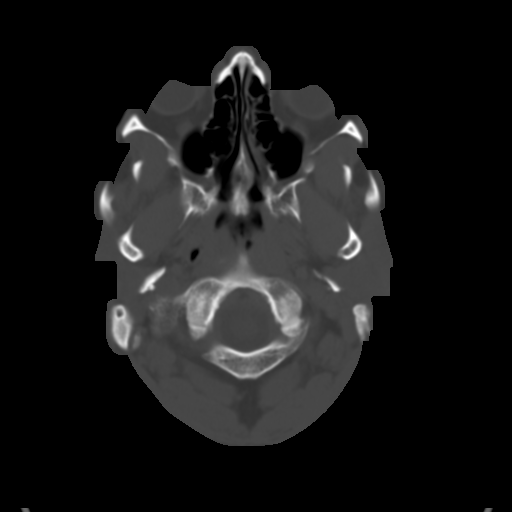
[im 6/31  brain]
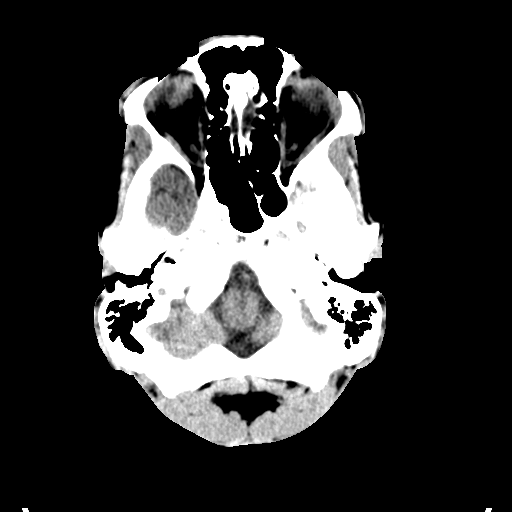
[im 9/31  brain]
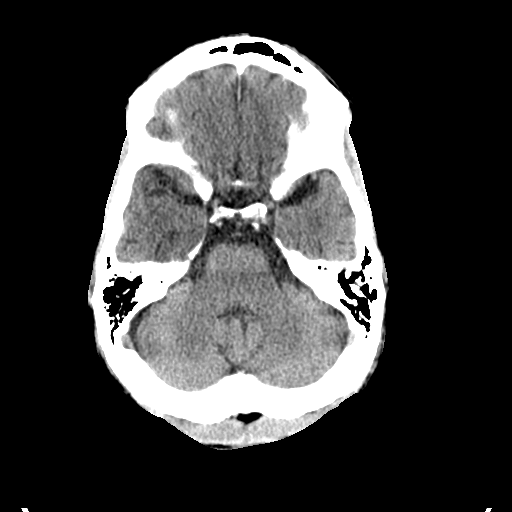
[im 11/31  brain]
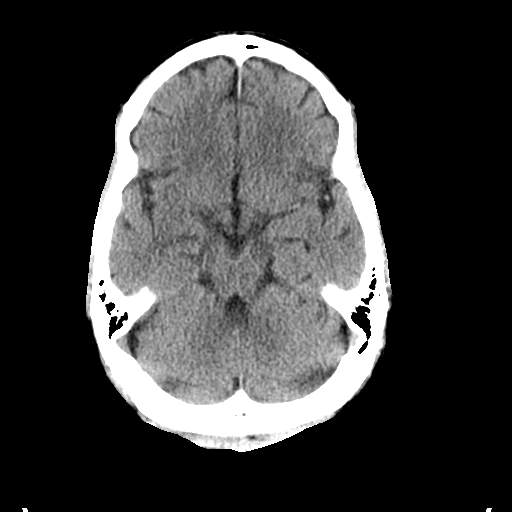
[im 14/31  brain]
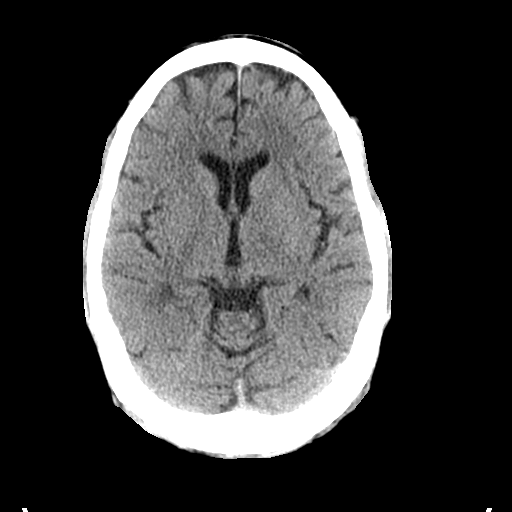
[im 14/31  bone]
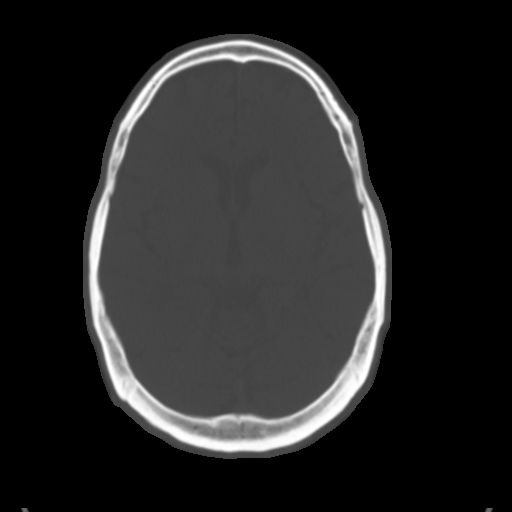
[im 17/31  brain]
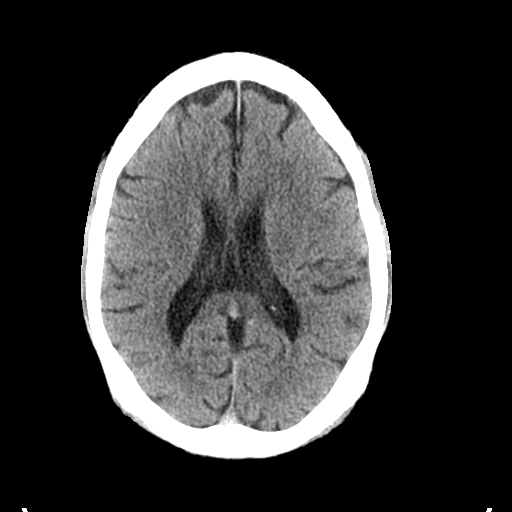
[im 20/31  brain]
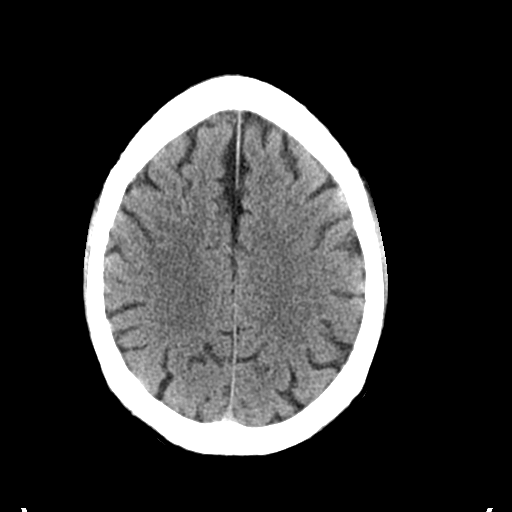
[im 23/31  brain]
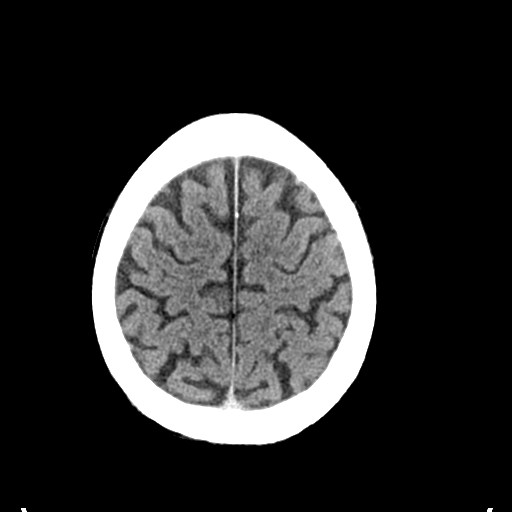
[im 25/31  brain]
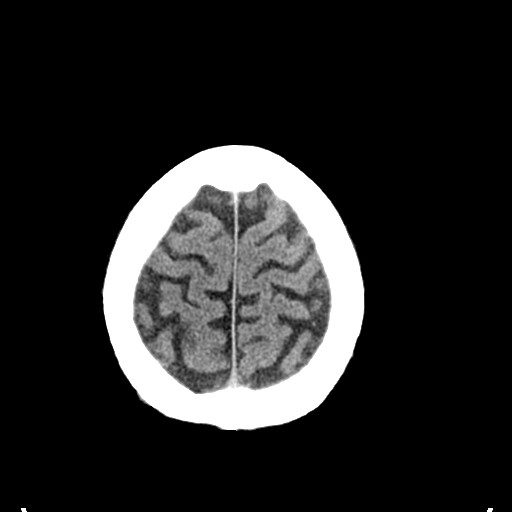
[im 25/31  bone]
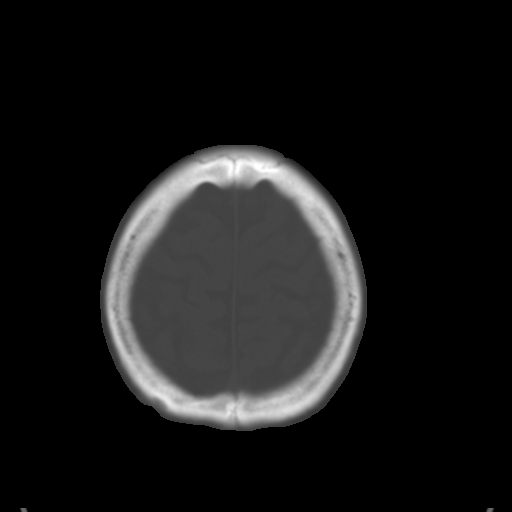
[im 28/31  brain]
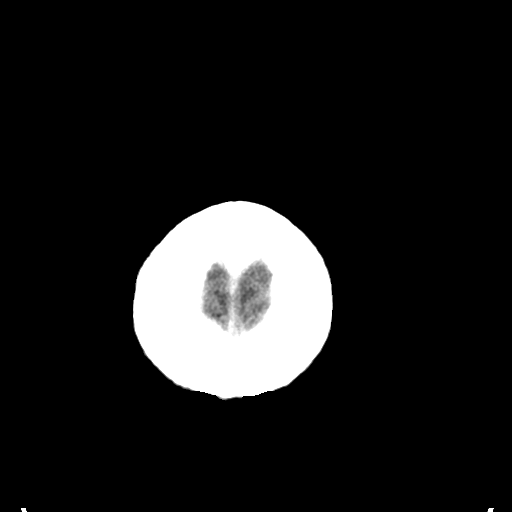

[Series 4: coronal soft tissue · coronal · 0.35mm/px · 3 of 75 slices shown]
[im 25/75  brain]
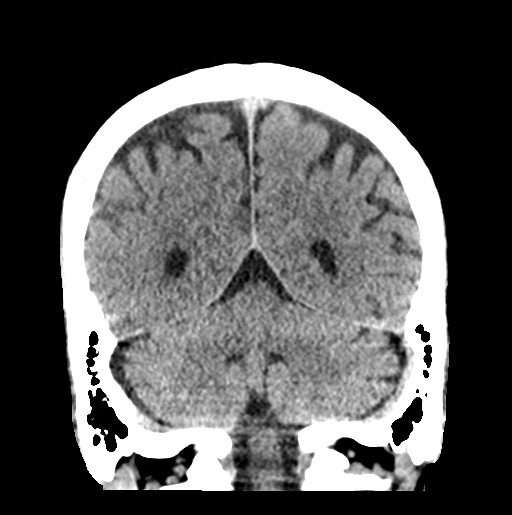
[im 33/75  brain]
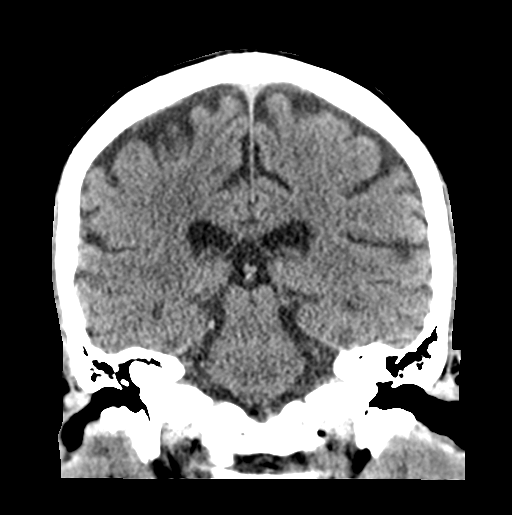
[im 42/75  brain]
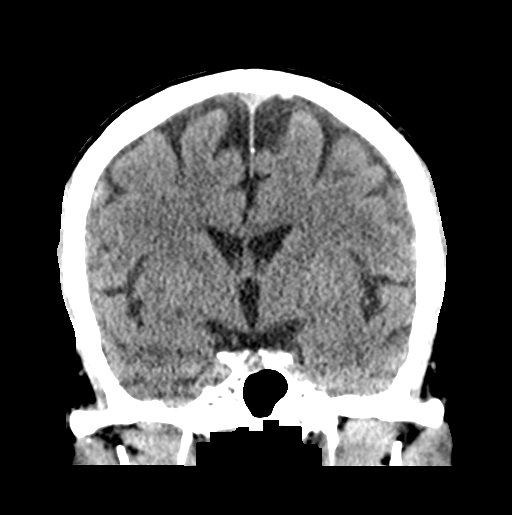

[Series 5: sagittal soft tissue · sagittal · 0.36mm/px · 3 of 56 slices shown]
[im 19/56  brain]
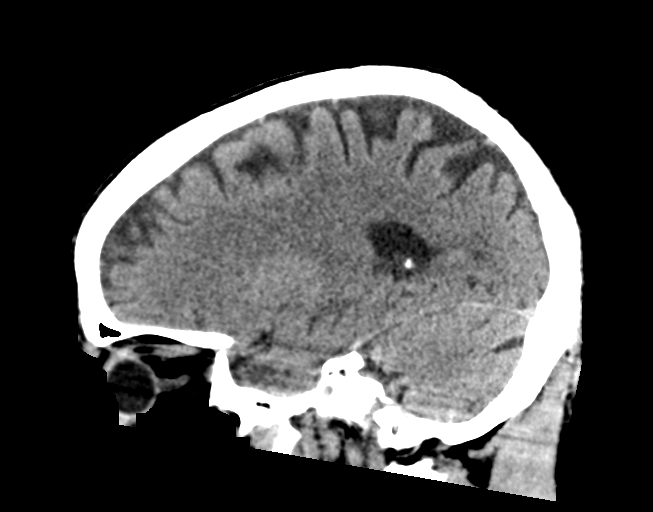
[im 28/56  brain]
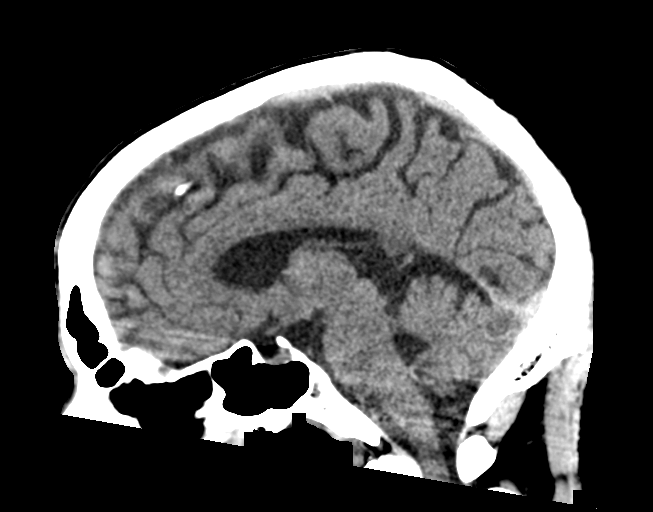
[im 37/56  brain]
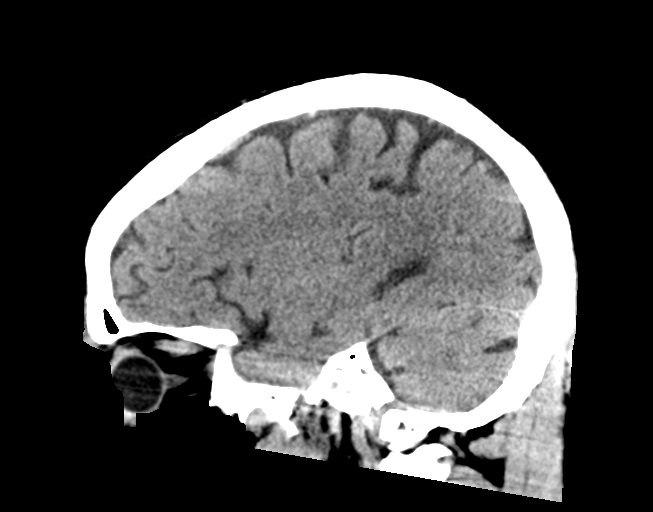

[16 of 47 positions shown; findings below may reference images not displayed]

FINDINGS: Brain: No evidence of acute infarction, hemorrhage, hydrocephalus,
extra-axial collection or mass lesion/mass effect. Cerebral cortical
volume loss slightly advanced for age.

Vascular: No hyperdense vessel or unexpected calcification.

Skull: Normal. Negative for fracture or focal lesion.

Sinuses/Orbits: No acute finding.

Other: None.
IMPRESSION: 1. No acute intracranial abnormality.
2. Age advanced cerebral cortical volume loss.

## 2022-11-04 ENCOUNTER — Telehealth: Payer: Self-pay | Admitting: Family Medicine

## 2022-11-04 ENCOUNTER — Other Ambulatory Visit: Payer: Self-pay | Admitting: Family Medicine

## 2022-11-04 DIAGNOSIS — I10 Essential (primary) hypertension: Secondary | ICD-10-CM

## 2022-11-04 NOTE — Telephone Encounter (Signed)
Medication Refill - Medication: amLODipine (NORVASC) 10 MG tablet  hydrochlorothiazide (HYDRODIURIL) 25 MG tablet   Has the patient contacted their pharmacy? Yes.    Pharmacy stated they did not have anything on file for pt. Referred to PCP.    Preferred Pharmacy (with phone number or street name):  [place], [place] Phone: [phone]  Fax: [phone]     Has the patient been seen for an appointment in the last year OR does the patient have an upcoming appointment? Yes.    Agent: Please be advised that RX refills may take up to 3 business days. We ask that you follow-up with your pharmacy.

## 2022-11-07 MED ORDER — HYDROCHLOROTHIAZIDE 25 MG PO TABS: 25.0000 mg | ORAL_TABLET | Freq: Every day | ORAL | 0 refills | Status: DC

## 2022-11-07 MED ORDER — AMLODIPINE BESYLATE 10 MG PO TABS: 10.0000 mg | ORAL_TABLET | Freq: Every day | ORAL | 0 refills | Status: DC

## 2022-11-07 NOTE — Telephone Encounter (Signed)
Requested Prescriptions  Pending Prescriptions Disp Refills   amLODipine (NORVASC) 10 MG tablet 90 tablet 0    Sig: Take 1 tablet (10 mg total) by mouth daily.     Cardiovascular: Calcium Channel Blockers 2 Failed - 11/04/2022  4:30 PM      Failed - Last BP in normal range    BP Readings from Last 1 Encounters:  09/15/22 (!) 148/75         Failed - Valid encounter within last 6 months    Recent Outpatient Visits           6 months ago Essential hypertension   [place] [facility], MD   8 months ago Prediabetes   [place] [facility], MD   1 year ago Essential hypertension   [place] [facility], MD   1 year ago Essential hypertension   [place] [facility], MD   1 year ago History of urine color changes   [place] [facility], MD       Future Appointments             In 2 weeks Dorna Mai, MD [facility] [place] in normal range    Pulse Readings from Last 1 Encounters:  09/15/22 93          hydrochlorothiazide (HYDRODIURIL) 25 MG tablet 90 tablet 0    Sig: Take 1 tablet (25 mg total) by mouth daily.     Cardiovascular: Diuretics - Thiazide Failed - 11/04/2022  4:30 PM      Failed - Last BP in normal range    BP Readings from Last 1 Encounters:  09/15/22 (!) 148/75         Failed - Valid encounter within last 6 months    Recent Outpatient Visits           6 months ago Essential hypertension   [place] [facility], MD   8 months ago Prediabetes   [place] [facility], MD   1 year ago Essential hypertension   [place] [facility], MD   1 year ago Essential hypertension   [place] [facility], MD   1 year ago History of urine color changes   [place] [facility], MD       Future Appointments             In 2 weeks Dorna Mai, MD [facility] [place] in normal range and within 180 days    Creatinine, Ser  Date Value Ref Range Status  08/09/2022 0.86 0.61 - 1.24 mg/dL Final         Passed - K in normal range and within 180 days    Potassium  Date Value Ref Range Status  08/09/2022 3.8 3.5 - 5.1 mmol/L Final         Passed - Na in normal range and within 180 days    Sodium  Date Value Ref Range Status  08/09/2022 137 135 -  145 mmol/L Final  02/23/2021 135 134 - 144 mmol/L Final

## 2022-11-24 ENCOUNTER — Ambulatory Visit: Payer: Medicaid Other | Admitting: Family Medicine

## 2023-01-04 ENCOUNTER — Ambulatory Visit
Admission: EM | Admit: 2023-01-04 | Discharge: 2023-01-04 | Disposition: A | Discharge status: Home / Self Care | Payer: Medicaid Other | Attending: Family Medicine | Admitting: Family Medicine

## 2023-01-04 DIAGNOSIS — I10 Essential (primary) hypertension: Secondary | ICD-10-CM

## 2023-01-04 DIAGNOSIS — R5383 Other fatigue: Secondary | ICD-10-CM

## 2023-01-04 DIAGNOSIS — R202 Paresthesia of skin: Secondary | ICD-10-CM

## 2023-01-04 MED ORDER — HYDROCHLOROTHIAZIDE 25 MG PO TABS: 25.0000 mg | ORAL_TABLET | Freq: Every day | ORAL | 0 refills | Status: DC

## 2023-01-04 MED ORDER — AMLODIPINE BESYLATE 10 MG PO TABS: 10.0000 mg | ORAL_TABLET | Freq: Every day | ORAL | 0 refills | Status: DC

## 2023-01-04 NOTE — ED Provider Notes (Signed)
[name]    CSN: [id number] Arrival date & time: 01/04/23  0845      History   Chief Complaint Chief Complaint  Patient presents with   tingling in chest    HPI Cameron Rogers is a 57 y.o. male.   He is here for his body is aching.  He also noted some "tingling" in the left side of the chest and into the shoulders.   He feels tired/"out of energy".  This started yesterday, but able to work.  Today he went to work and felt it again so he thought he should come here for evaluation.  No fevers/chills.  No URI symptoms.  No n/v, no diarrhea/congestion.  No chest pain or sob noted, just a "tingle".  He felt "strange" this am which is why he came here.  No sick contacts.   He states he drinks 1-2 beers/day.  He states his last drink was several days ago.  Denies any drug use.   He took his hctz this morning, almost out.  He is out of the norvasc 10mg .        Past Medical History:  Diagnosis Date   Alcohol dependence ([place])    Allergy    SEASONAL   Anemia    Arthritis    Heart murmur    Hypertension    Prediabetes    Unexplained weight loss 02/06/2021    Patient Active Problem List   Diagnosis Date Noted   Angioedema 07/15/2021   Protein-calorie malnutrition, severe 02/08/2021   Alcohol withdrawal ([place]) 02/06/2021   Alcohol dependence with uncomplicated withdrawal ([place]) 02/06/2021   Nausea, vomiting, and diarrhea 02/06/2021   Groin rash 02/06/2021   Unexplained weight loss 02/06/2021   Hyponatremia 02/05/2021   Acute upper GI bleed 02/26/2020   COVID-19 virus infection 02/26/2020   Essential hypertension 11/28/2019   Neuropathy, alcoholic ([place]) 14/48/1856   Elevated PSA 11/28/2019   Elevated liver function tests 11/28/2019   Prediabetes 11/28/2019   Alcohol abuse 11/28/2019    Past Surgical History:  Procedure Laterality Date   NO PAST SURGERIES         Home Medications    Prior to Admission medications   Medication Sig Start  Date End Date Taking? Authorizing Provider  amLODipine (NORVASC) 10 MG tablet Take 1 tablet (10 mg total) by mouth daily. 11/07/22   Dorna Mai, MD  dicyclomine (BENTYL) 20 MG tablet Take 1 tablet (20 mg total) by mouth 2 (two) times daily. 08/09/22   Elgie Congo, MD  hydrochlorothiazide (HYDRODIURIL) 25 MG tablet Take 1 tablet (25 mg total) by mouth daily. 11/07/22   Dorna Mai, MD  losartan (COZAAR) 50 MG tablet Take 1 tablet (50 mg total) by mouth daily. 04/13/22   Dorna Mai, MD  omeprazole (PRILOSEC) 20 MG capsule Take 1 capsule (20 mg total) by mouth daily. 08/09/22 09/08/22  Elgie Congo, MD  ondansetron (ZOFRAN-ODT) 4 MG disintegrating tablet Take 1 tablet (4 mg total) by mouth every 8 (eight) hours as needed for nausea or vomiting. 08/09/22   Elgie Congo, MD  promethazine (PHENERGAN) 25 MG tablet Take 1 tablet (25 mg total) by mouth every 8 (eight) hours as needed for nausea or vomiting. 04/25/17 04/02/19  Davonna Belling, MD    Family History Family History  Problem Relation Age of Onset   Diabetes Mother    Breast cancer Sister    Diabetes Sister    Heart murmur Sister    Diabetes Brother  Colon cancer Neg Hx    Colon polyps Neg Hx    Esophageal cancer Neg Hx    Rectal cancer Neg Hx    Stomach cancer Neg Hx    Ovarian cancer Neg Hx     Social History Social History   Tobacco Use   Smoking status: Every Day    Packs/day: 0.50    Years: 38.00    Additional pack years: 0.00    Total pack years: 19.00    Types: Cigarettes   Smokeless tobacco: Never  Vaping Use   Vaping Use: Never used  Substance Use Topics   Alcohol use: Not Currently   Drug use: No     Allergies   Doxycycline and Lisinopril   Review of Systems Review of Systems  Constitutional:  Positive for fatigue. Negative for chills and fever.  HENT: Negative.    Respiratory: Negative.    Cardiovascular:  Negative for chest pain and palpitations.  Gastrointestinal:  Negative.   Endocrine: Negative.   Genitourinary: Negative.   Musculoskeletal:  Positive for neck pain.  Skin: Negative.   Neurological:  Positive for light-headedness.  Hematological: Negative.   Psychiatric/Behavioral: Negative.       Physical Exam Triage Vital Signs ED Triage Vitals [01/04/23 0854]  Enc Vitals Group     BP (!) 157/96     Pulse Rate 99     Resp 16     Temp 98.2 F (36.8 C)     Temp Source Oral     SpO2 96 %     Weight      Height      Head Circumference      Peak Flow      Pain Score 6     Pain Loc      Pain Edu?      Excl. in [place]?    No data found.  Updated Vital Signs BP (!) 157/96 (BP Location: Left Arm)   Pulse 99   Temp 98.2 F (36.8 C) (Oral)   Resp 16   SpO2 96%   Visual Acuity Right Eye Distance:   Left Eye Distance:   Bilateral Distance:    Right Eye Near:   Left Eye Near:    Bilateral Near:     Physical Exam Constitutional:      General: He is not in acute distress.    Appearance: Normal appearance. He is not ill-appearing.  HENT:     Head: Normocephalic.     Nose: Nose normal.     Mouth/Throat:     Mouth: Mucous membranes are moist.     Pharynx: No oropharyngeal exudate or posterior oropharyngeal erythema.  Eyes:     Extraocular Movements: Extraocular movements intact.     Pupils: Pupils are equal, round, and reactive to light.  Cardiovascular:     Rate and Rhythm: Normal rate and regular rhythm.  Pulmonary:     Effort: Pulmonary effort is normal.     Breath sounds: Normal breath sounds. No wheezing or rhonchi.  Abdominal:     Palpations: Abdomen is soft.  Musculoskeletal:     Cervical back: Normal range of motion and neck supple. Tenderness present. No rigidity.     Comments: Slight TTP to the cervical spine, upper neck and shoulders;  no TTP to the chest wall;  full rom of the neck;  spurlings negative  Lymphadenopathy:     Cervical: No cervical adenopathy.  Skin:    General: Skin is warm.  Neurological:  General: No focal deficit present.     Mental Status: He is alert and oriented to person, place, and time.     Motor: No weakness.     Comments: Slight tremor with UE strength testing  Psychiatric:        Mood and Affect: Mood normal.        Behavior: Behavior normal.      UC Treatments / Results  Labs (all labs ordered are listed, but only abnormal results are displayed) Labs Reviewed  CBC WITH DIFFERENTIAL/PLATELET  COMPREHENSIVE METABOLIC PANEL  VITAMIN [id number]    EKG Sinus rhythm;  t wave abnormality seen on previous ekgs;  no change from previous  Radiology No results found.  Procedures Procedures (including critical care time)  Medications Ordered in UC Medications - No data to display  Initial Impression / Assessment and Plan / UC Course  I have reviewed the triage vital signs and the nursing notes.  Pertinent labs & imaging results that were available during my care of the patient were reviewed by me and considered in my medical decision making (see chart for details).   Final Clinical Impressions(s) / UC Diagnoses   Final diagnoses:  Essential hypertension  Other fatigue  Tingling of skin     Discharge Instructions      You were seen today for not feeling well.  Your EKG was unchanged from previous.  I have ordered blood work for more information.  This will be resulted tomorrow and you will be notified of results if there is anything abnormal.  I have refilled your blood pressure medications today.  You need to make an appointment with your primary care provider for follow up and further refills.  I recommend you get plenty of rest and increase fluids.  If you have worsening symptoms then please go to the ER for further evaluation.      ED Prescriptions     Medication Sig Dispense Auth. Provider   hydrochlorothiazide (HYDRODIURIL) 25 MG tablet Take 1 tablet (25 mg total) by mouth daily. 30 tablet Ivannia Willhelm, MD   amLODipine (NORVASC) 10 MG  tablet Take 1 tablet (10 mg total) by mouth daily. 30 tablet Rondel Oh, MD      PDMP not reviewed this encounter.   Rondel Oh, MD 01/04/23 [phone]

## 2023-01-04 NOTE — Discharge Instructions (Signed)
You were seen today for not feeling well.  Your EKG was unchanged from previous.  I have ordered blood work for more information.  This will be resulted tomorrow and you will be notified of results if there is anything abnormal.  I have refilled your blood pressure medications today.  You need to make an appointment with your primary care provider for follow up and further refills.  I recommend you get plenty of rest and increase fluids.  If you have worsening symptoms then please go to the ER for further evaluation.

## 2023-01-04 NOTE — ED Triage Notes (Signed)
Pt c/o fatigue, malaise, tingling in chest, body aches,   Onset ~ yesterday   States "only take the 25 mg medicine. I ran out of my 10 mg one."

## 2023-01-05 LAB — CBC WITH DIFFERENTIAL/PLATELET
Basophils Absolute: 0 10*3/uL (ref 0.0–0.2)
Basos: 1 %
EOS (ABSOLUTE): 0.1 10*3/uL (ref 0.0–0.4)
Eos: 1 %
Hematocrit: 46 % (ref 37.5–51.0)
Hemoglobin: 16 g/dL (ref 13.0–17.7)
Immature Grans (Abs): 0 10*3/uL (ref 0.0–0.1)
Immature Granulocytes: 0 %
Lymphocytes Absolute: 2 10*3/uL (ref 0.7–3.1)
Lymphs: 51 %
MCH: 31.6 pg (ref 26.6–33.0)
MCHC: 34.8 g/dL (ref 31.5–35.7)
MCV: 91 fL (ref 79–97)
Monocytes Absolute: 0.4 10*3/uL (ref 0.1–0.9)
Monocytes: 10 %
Neutrophils Absolute: 1.4 10*3/uL (ref 1.4–7.0)
Neutrophils: 37 %
Platelets: 203 10*3/uL (ref 150–450)
RBC: 5.07 x10E6/uL (ref 4.14–5.80)
RDW: 12.7 % (ref 11.6–15.4)
WBC: 3.9 10*3/uL (ref 3.4–10.8)

## 2023-01-05 LAB — COMPREHENSIVE METABOLIC PANEL
ALT: 39 IU/L (ref 0–44)
AST: 65 IU/L — ABNORMAL HIGH (ref 0–40)
Albumin/Globulin Ratio: 1.5 (ref 1.2–2.2)
Albumin: 4.6 g/dL (ref 3.8–4.9)
Alkaline Phosphatase: 104 IU/L (ref 44–121)
BUN/Creatinine Ratio: 10 (ref 9–20)
BUN: 9 mg/dL (ref 6–24)
Bilirubin Total: 0.7 mg/dL (ref 0.0–1.2)
CO2: 18 mmol/L — ABNORMAL LOW (ref 20–29)
Calcium: 9.7 mg/dL (ref 8.7–10.2)
Chloride: 101 mmol/L (ref 96–106)
Creatinine, Ser: 0.88 mg/dL (ref 0.76–1.27)
Globulin, Total: 3 g/dL (ref 1.5–4.5)
Glucose: 97 mg/dL (ref 70–99)
Potassium: 4.4 mmol/L (ref 3.5–5.2)
Sodium: 140 mmol/L (ref 134–144)
Total Protein: 7.6 g/dL (ref 6.0–8.5)
eGFR: 101 mL/min/{1.73_m2} (ref >59)

## 2023-01-05 LAB — VITAMIN B12: Vitamin B-12: 284 pg/mL (ref 232–1245)

## 2023-01-30 ENCOUNTER — Ambulatory Visit (INDEPENDENT_AMBULATORY_CARE_PROVIDER_SITE_OTHER): Payer: Medicaid Other | Admitting: Family Medicine

## 2023-01-30 ENCOUNTER — Encounter: Payer: Self-pay | Admitting: Family Medicine

## 2023-01-30 VITALS — BP 162/110 | HR 98 | Temp 98.1°F | Resp 16 | Wt 185.2 lb

## 2023-01-30 DIAGNOSIS — R7303 Prediabetes: Secondary | ICD-10-CM | POA: Diagnosis not present

## 2023-01-30 DIAGNOSIS — I10 Essential (primary) hypertension: Secondary | ICD-10-CM

## 2023-01-30 DIAGNOSIS — F17210 Nicotine dependence, cigarettes, uncomplicated: Secondary | ICD-10-CM | POA: Diagnosis not present

## 2023-01-30 MED ORDER — METOPROLOL SUCCINATE ER 25 MG PO TB24: 25.0000 mg | ORAL_TABLET | Freq: Every day | ORAL | 3 refills | Status: DC

## 2023-01-30 MED ORDER — AMLODIPINE BESYLATE 10 MG PO TABS: 10.0000 mg | ORAL_TABLET | Freq: Every day | ORAL | 0 refills | Status: DC

## 2023-01-30 NOTE — Progress Notes (Signed)
Patient is here for their 6 month follow-up Patient has no concerns today Care gaps have been discussed with patient  

## 2023-02-06 ENCOUNTER — Encounter: Payer: Self-pay | Admitting: Family Medicine

## 2023-02-06 NOTE — Progress Notes (Signed)
Established Patient Office Visit  Subjective    Patient ID: Cameron Rogers, male    DOB: [birth year]  Age: 57 y.o. MRN: [id number]  CC:  Chief Complaint  Patient presents with   Follow-up    HPI Chantz Montefusco presents for routine follow up of chronic med issues. Patient denies acute complaints or concerns.    Outpatient Encounter Medications as of 01/30/2023  Medication Sig   hydrochlorothiazide (HYDRODIURIL) 25 MG tablet Take 1 tablet (25 mg total) by mouth daily.   metoprolol succinate (TOPROL-XL) 25 MG 24 hr tablet Take 1 tablet (25 mg total) by mouth daily.   [DISCONTINUED] amLODipine (NORVASC) 10 MG tablet Take 1 tablet (10 mg total) by mouth daily.   amLODipine (NORVASC) 10 MG tablet Take 1 tablet (10 mg total) by mouth daily.   [DISCONTINUED] promethazine (PHENERGAN) 25 MG tablet Take 1 tablet (25 mg total) by mouth every 8 (eight) hours as needed for nausea or vomiting.   No facility-administered encounter medications on file as of 01/30/2023.    Past Medical History:  Diagnosis Date   Alcohol dependence    Allergy    SEASONAL   Anemia    Arthritis    Heart murmur    Hypertension    Prediabetes    Unexplained weight loss 02/06/2021    Past Surgical History:  Procedure Laterality Date   NO PAST SURGERIES      Family History  Problem Relation Age of Onset   Diabetes Mother    Breast cancer Sister    Diabetes Sister    Heart murmur Sister    Diabetes Brother    Colon cancer Neg Hx    Colon polyps Neg Hx    Esophageal cancer Neg Hx    Rectal cancer Neg Hx    Stomach cancer Neg Hx    Ovarian cancer Neg Hx     Social History   Socioeconomic History   Marital status: Single    Spouse name: Not on file   Number of children: Not on file   Years of education: Not on file   Highest education level: Not on file  Occupational History   Occupation: Oceanographer  Tobacco Use   Smoking status: Every Day    Packs/day: 0.50    Years: 38.00     Additional pack years: 0.00    Total pack years: 19.00    Types: Cigarettes   Smokeless tobacco: Never  Vaping Use   Vaping Use: Never used  Substance and Sexual Activity   Alcohol use: Not Currently   Drug use: No   Sexual activity: Yes  Other Topics Concern   Not on file  Social History Narrative   Not on file   Social Determinants of Health   Financial Resource Strain: Not on file  Food Insecurity: No Food Insecurity (01/30/2023)   Hunger Vital Sign    Worried About Running Out of Food in the Last Year: Never true    Ran Out of Food in the Last Year: Never true  Transportation Needs: Not on file  Physical Activity: Not on file  Stress: Not on file  Social Connections: Not on file  Intimate Partner Violence: Not on file    Review of Systems  All other systems reviewed and are negative.       Objective    BP (!) 162/110   Pulse 98   Temp 98.1 F (36.7 C) (Oral)   Resp 16   Wt 185 lb 3.2  oz (84 kg)   SpO2 91%   BMI 23.78 kg/m   Physical Exam Vitals and nursing note reviewed.  Constitutional:      General: He is not in acute distress. Cardiovascular:     Rate and Rhythm: Normal rate and regular rhythm.  Pulmonary:     Effort: Pulmonary effort is normal.     Breath sounds: Normal breath sounds.  Abdominal:     Palpations: Abdomen is soft.     Tenderness: There is no abdominal tenderness.  Musculoskeletal:     Right lower leg: No edema.     Left lower leg: No edema.  Neurological:     General: No focal deficit present.     Mental Status: He is alert and oriented to person, place, and time.         Assessment & Plan:  1. Uncontrolled hypertension Elevated reading. Discussed compliance. Meds refilled.  - amLODipine (NORVASC) 10 MG tablet; Take 1 tablet (10 mg total) by mouth daily.  Dispense: 30 tablet; Refill: 0  2. Prediabetes    Return in about 3 weeks (around 02/20/2023) for follow up.   Tommie Raymond, MD
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
## 2023-08-22 ENCOUNTER — Other Ambulatory Visit: Payer: Self-pay | Admitting: Family Medicine

## 2023-08-22 DIAGNOSIS — I10 Essential (primary) hypertension: Secondary | ICD-10-CM

## 2023-08-23 ENCOUNTER — Other Ambulatory Visit: Payer: Self-pay

## 2023-08-23 DIAGNOSIS — I10 Essential (primary) hypertension: Secondary | ICD-10-CM

## 2023-08-23 MED ORDER — AMLODIPINE BESYLATE 10 MG PO TABS: 10.0000 mg | ORAL_TABLET | Freq: Every day | ORAL | 0 refills | Status: DC

## 2023-09-22 ENCOUNTER — Other Ambulatory Visit: Payer: Self-pay | Admitting: Family Medicine

## 2023-09-22 DIAGNOSIS — I10 Essential (primary) hypertension: Secondary | ICD-10-CM

## 2023-10-20 IMAGING — CR DG CHEST 1V
1 series · 1 of 1 positions shown · non-contrast
Comparison: Radiograph 05/22/2020

CLINICAL DATA: Shortness of breath

EXAM:
CHEST  1 VIEW

[chest pa]
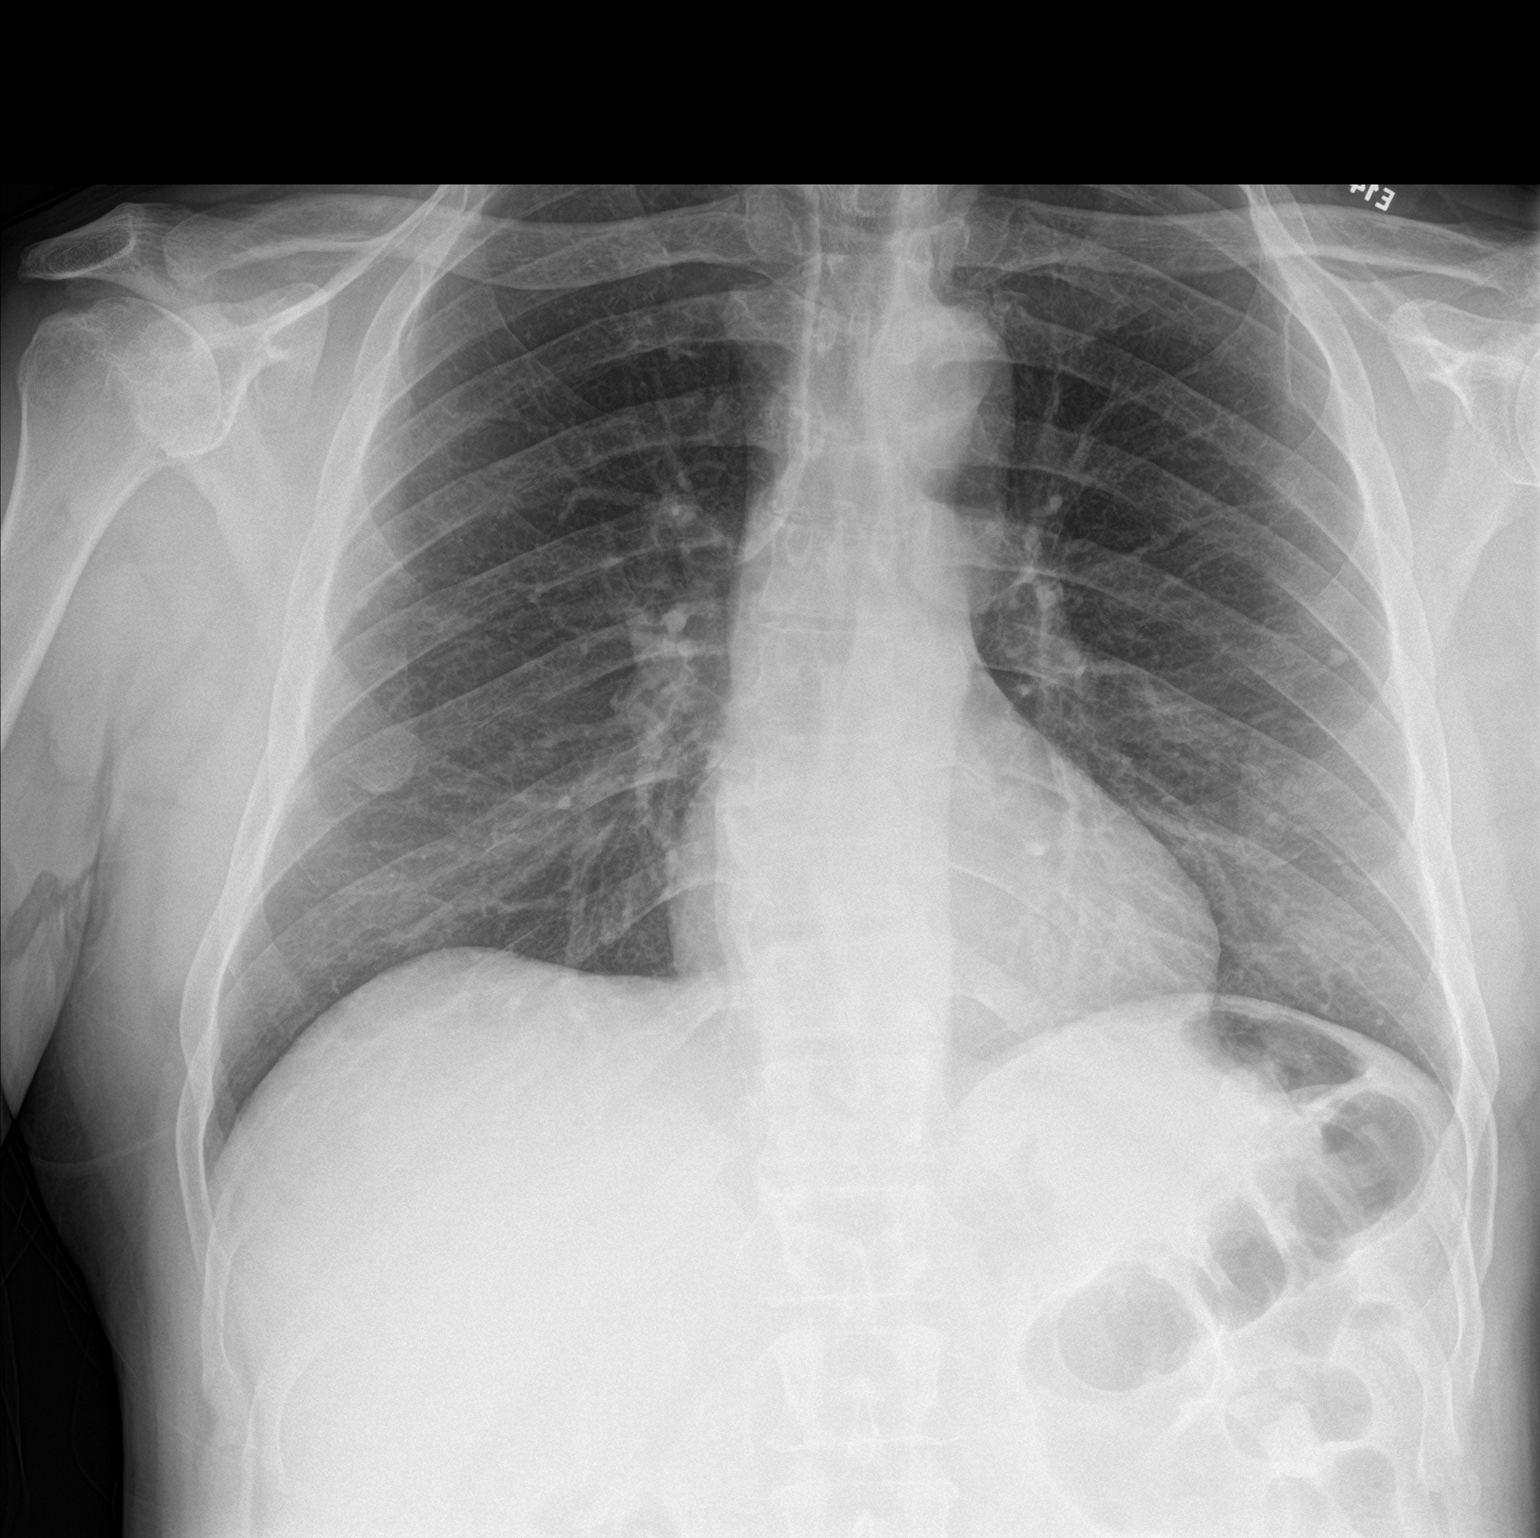

[1 of 1 positions shown; findings below may reference images not displayed]

FINDINGS: The heart size and mediastinal contours are within normal limits.No
focal airspace disease. No pleural effusion or pneumothorax.No acute
osseous abnormality.
IMPRESSION: No evidence of acute cardiopulmonary disease.

## 2023-12-08 ENCOUNTER — Other Ambulatory Visit: Payer: Self-pay | Admitting: Family Medicine

## 2023-12-08 DIAGNOSIS — I10 Essential (primary) hypertension: Secondary | ICD-10-CM

## 2023-12-08 NOTE — Telephone Encounter (Signed)
Copied from CRM [id number]. Topic: Clinical - Medication Refill >> Dec 08, 2023 12:00 PM Antony Haste wrote: Most Recent Primary Care Visit:  Provider: Georganna Skeans  Department: [facility]  Visit Type: OFFICE VISIT  Date: 01/30/2023  Medication: hydrochlorothiazide (HYDRODIURIL) 25 MG tablet amLODipine (NORVASC) 10 MG tablet  Has the patient contacted their pharmacy? No (Agent: If no, request that the patient contact the pharmacy for the refill. If patient does not wish to contact the pharmacy document the reason why and proceed with request.) (Agent: If yes, when and what did the pharmacy advise?)  Is this the correct pharmacy for this prescription? Yes If no, delete pharmacy and type the correct one.  This is the patient's preferred pharmacy:  [facility] [address] [place] - [address] [place] [address] Phone: [phone] Fax: [phone]  [id number] [place], [place] - [address] AT [facility] [address] [place] [address] Phone: [phone] Fax: [phone]  [facility] [address] [place] - [address] [place] [address] Phone: [phone] Fax: [phone]   Has the prescription been filled recently? No  Is the patient out of the medication? Yes  Has the patient been seen for an appointment in the last year OR does the patient have an upcoming appointment? Yes  Can we respond through MyChart? No, callback preferred.  Agent: Please be advised that Rx refills may take up to 3 business days. We ask that you follow-up with your pharmacy.

## 2023-12-11 ENCOUNTER — Other Ambulatory Visit: Payer: Self-pay | Admitting: Emergency Medicine

## 2023-12-11 DIAGNOSIS — I10 Essential (primary) hypertension: Secondary | ICD-10-CM

## 2023-12-11 MED ORDER — AMLODIPINE BESYLATE 10 MG PO TABS: 10.0000 mg | ORAL_TABLET | Freq: Every day | ORAL | 0 refills | Status: DC

## 2023-12-11 NOTE — Telephone Encounter (Signed)
 Requested medication (s) are due for refill today: yes  Requested medication (s) are on the active medication list: yes  Last refill:  08/23/23 and 10/24/23  Future visit scheduled: no  Notes to clinic:  Unable to refill per protocol, courtesy refill already given, routing for provider approval.      Requested Prescriptions  Pending Prescriptions Disp Refills   amLODipine (NORVASC) 10 MG tablet 30 tablet 0    Sig: Take 1 tablet (10 mg total) by mouth daily.     Cardiovascular: Calcium Channel Blockers 2 Failed - 12/11/2023  8:23 AM      Failed - Last BP in normal range    BP Readings from Last 1 Encounters:  01/30/23 (!) 162/110         Failed - Valid encounter within last 6 months    Recent Outpatient Visits           10 months ago Uncontrolled hypertension   [place] [facility], MD   1 year ago Essential hypertension   [place] [facility], MD   1 year ago Prediabetes   [place] [facility] at [facility], MD   2 years ago Essential hypertension   [place] [facility] at [facility], MD   2 years ago Essential hypertension   [place] [facility], MD              Passed - Last Heart Rate in normal range    Pulse Readings from Last 1 Encounters:  01/30/23 98          hydrochlorothiazide (HYDRODIURIL) 25 MG tablet 30 tablet 0    Sig: Take 1 tablet (25 mg total) by mouth daily.     Cardiovascular: Diuretics - Thiazide Failed - 12/11/2023  8:23 AM      Failed - Cr in normal range and within 180 days    Creatinine, Ser  Date Value Ref Range Status  01/04/2023 0.88 0.76 - 1.27 mg/dL Final         Failed - K in normal range and within 180 days    Potassium  Date Value Ref Range Status  01/04/2023 4.4 3.5 - 5.2 mmol/L Final         Failed - Na in normal range and within 180 days    Sodium  Date Value Ref  Range Status  01/04/2023 140 134 - 144 mmol/L Final         Failed - Last BP in normal range    BP Readings from Last 1 Encounters:  01/30/23 (!) 162/110         Failed - Valid encounter within last 6 months    Recent Outpatient Visits           10 months ago Uncontrolled hypertension   [place] [facility], MD   1 year ago Essential hypertension   [place] [facility], MD   1 year ago Prediabetes   [place] [facility] at [facility], MD   2 years ago Essential hypertension   [place] [facility] at [facility], MD   2 years ago Essential hypertension    [facility] at [facility], MD

## 2024-03-28 ENCOUNTER — Other Ambulatory Visit: Payer: Self-pay | Admitting: Family Medicine

## 2024-03-28 DIAGNOSIS — I10 Essential (primary) hypertension: Secondary | ICD-10-CM

## 2024-04-05 ENCOUNTER — Ambulatory Visit: Payer: Self-pay

## 2024-04-05 NOTE — Telephone Encounter (Signed)
 FYI Only or Action Required?: Action required by provider: request for appointment.  Patient was last seen in primary care on 01/30/2023 by Abraham Abo, MD. Called Nurse Triage reporting Foot Swelling. Symptoms began today. Interventions attempted: Nothing. Symptoms are: gradually worsening.  Triage Disposition: See Physician Within 24 Hours  Patient/caregiver understands and will follow disposition?: Yes  Copied from CRM [id number]. Topic: Clinical - Red Word Triage >> Apr 05, 2024  2:12 PM Rosamond Comes wrote: Red Word that prompted transfer to Nurse Triage: patient calling has pain,burning, sometimes his toes are swollen in the morning, in both feet Reason for Disposition  Numbness (i.e., loss of sensation) in foot or toes  (Exception: Just tingling; numbness present > 2 weeks.)  Answer Assessment - Initial Assessment Questions Patient is concerned because he has had increased swelling to bilateral feet/toes and now to legs. Patient reports burning in bilateral feet, states he has had numbness and tingling. No angina, no difficulty breathing or fevers.  Pt reports feet darker than body and states that he sees blood clot on leg but reports no pain/irritation.  Nurse advises best to be evaluated in person at an UC to make sure pt does not have a blood clot. Nurse tried to assist pt with getting to a [facility], but pt has a UC he is more familiar with even though it is not [facility]. Patient verbalizes understanding of needing to go to UC and he will.   Patient wants to make sure his MD is aware that he is out of his BP med and he needs to get more, and he understands he needs to come in for an appmt. Patient added to waitlist.   1. ONSET: When did the pain start?      For a while  2. LOCATION: Where is the pain located?      Bilat feet and toes 3. PAIN: How bad is the pain?    (Scale 1-10; or mild, moderate, severe)  - MILD (1-3): doesn't interfere with normal activities.   -  MODERATE (4-7): interferes with normal activities (e.g., work or school) or awakens from sleep, limping.   - SEVERE (8-10): excruciating pain, unable to do any normal activities, unable to walk.      5/10 but can reach 10/10. Can be difficult to walk and affect pt's balance 4. WORK OR EXERCISE: Has there been any recent work or exercise that involved this part of the body?      He tries to work through it but pain is increasing more.  5. CAUSE: What do you think is causing the foot pain?     Gout,  6. OTHER SYMPTOMS: Do you have any other symptoms? (e.g., leg pain, rash, fever, numbness)     Swelling bilat big toes and in legs, burning in feet, numb/tingles - Hx neuropathy  Protocols used: Foot Pain-A-AH

## 2024-05-20 ENCOUNTER — Encounter (HOSPITAL_COMMUNITY): Payer: Self-pay

## 2024-05-20 ENCOUNTER — Emergency Department (HOSPITAL_COMMUNITY)
Admission: EM | Admit: 2024-05-20 | Discharge: 2024-05-20 | Discharge status: Against Medical Advice | Attending: Emergency Medicine | Admitting: Emergency Medicine

## 2024-05-20 ENCOUNTER — Other Ambulatory Visit: Payer: Self-pay

## 2024-05-20 DIAGNOSIS — I10 Essential (primary) hypertension: Secondary | ICD-10-CM | POA: Diagnosis not present

## 2024-05-20 DIAGNOSIS — R55 Syncope and collapse: Secondary | ICD-10-CM | POA: Insufficient documentation

## 2024-05-20 DIAGNOSIS — Z5321 Procedure and treatment not carried out due to patient leaving prior to being seen by health care provider: Secondary | ICD-10-CM | POA: Insufficient documentation

## 2024-05-20 NOTE — ED Triage Notes (Signed)
 Pt BIB [facility] from home due syncopal episode.  Woke up at 0500 and passed out and woke up at 0700.  Pt does have extensive alcohol history and HTN. No history of seizures. VS BP 136/70 HR 80, SpO2 99% RA.  20g right forearm.

## 2024-05-20 NOTE — ED Notes (Signed)
 Pt upset because no provider has come in... pt leaving AMA/without being seen

## 2024-05-22 ENCOUNTER — Other Ambulatory Visit: Payer: Self-pay | Admitting: Family Medicine

## 2024-05-22 ENCOUNTER — Other Ambulatory Visit: Payer: Self-pay

## 2024-05-22 ENCOUNTER — Emergency Department (HOSPITAL_COMMUNITY)
Admission: EM | Admit: 2024-05-22 | Discharge: 2024-05-22 | Disposition: A | Discharge status: Home / Self Care | Attending: Emergency Medicine | Admitting: Emergency Medicine

## 2024-05-22 DIAGNOSIS — I10 Essential (primary) hypertension: Secondary | ICD-10-CM

## 2024-05-22 DIAGNOSIS — F10230 Alcohol dependence with withdrawal, uncomplicated: Secondary | ICD-10-CM | POA: Insufficient documentation

## 2024-05-22 DIAGNOSIS — Z20822 Contact with and (suspected) exposure to covid-19: Secondary | ICD-10-CM | POA: Diagnosis not present

## 2024-05-22 DIAGNOSIS — G252 Other specified forms of tremor: Secondary | ICD-10-CM | POA: Diagnosis not present

## 2024-05-22 DIAGNOSIS — R112 Nausea with vomiting, unspecified: Secondary | ICD-10-CM | POA: Diagnosis not present

## 2024-05-22 DIAGNOSIS — F10939 Alcohol use, unspecified with withdrawal, unspecified: Secondary | ICD-10-CM | POA: Diagnosis not present

## 2024-05-22 DIAGNOSIS — F10930 Alcohol use, unspecified with withdrawal, uncomplicated: Secondary | ICD-10-CM

## 2024-05-22 LAB — COMPREHENSIVE METABOLIC PANEL WITH GFR
ALT: 40 U/L (ref 0–44)
AST: 112 U/L — ABNORMAL HIGH (ref 15–41)
Albumin: 3.7 g/dL (ref 3.5–5.0)
Alkaline Phosphatase: 92 U/L (ref 38–126)
Anion gap: 16 — ABNORMAL HIGH (ref 5–15)
BUN: 12 mg/dL (ref 6–20)
CO2: 20 mmol/L — ABNORMAL LOW (ref 22–32)
Calcium: 8.9 mg/dL (ref 8.9–10.3)
Chloride: 95 mmol/L — ABNORMAL LOW (ref 98–111)
Creatinine, Ser: 0.93 mg/dL (ref 0.61–1.24)
GFR, Estimated: >60 mL/min (ref >60)
Glucose, Bld: 140 mg/dL — ABNORMAL HIGH (ref 70–99)
Potassium: 4.2 mmol/L (ref 3.5–5.1)
Sodium: 131 mmol/L — ABNORMAL LOW (ref 135–145)
Total Bilirubin: 1 mg/dL (ref 0.0–1.2)
Total Protein: 7.5 g/dL (ref 6.5–8.1)

## 2024-05-22 LAB — RAPID URINE DRUG SCREEN, HOSP PERFORMED
Amphetamines: NOT DETECTED
Barbiturates: NOT DETECTED
Benzodiazepines: NOT DETECTED
Cocaine: NOT DETECTED
Opiates: NOT DETECTED
Tetrahydrocannabinol: NOT DETECTED

## 2024-05-22 LAB — CBC
HCT: 44.6 % (ref 39.0–52.0)
Hemoglobin: 14.8 g/dL (ref 13.0–17.0)
MCH: 30.5 pg (ref 26.0–34.0)
MCHC: 33.2 g/dL (ref 30.0–36.0)
MCV: 91.8 fL (ref 80.0–100.0)
Platelets: 180 K/uL (ref 150–400)
RBC: 4.86 MIL/uL (ref 4.22–5.81)
RDW: 15.3 % (ref 11.5–15.5)
WBC: 9 K/uL (ref 4.0–10.5)
nRBC: 0 % (ref 0.0–0.2)

## 2024-05-22 LAB — LIPASE, BLOOD: Lipase: 24 U/L (ref 11–51)

## 2024-05-22 LAB — ETHANOL: Alcohol, Ethyl (B): <15 mg/dL (ref <15)

## 2024-05-22 MED ORDER — LORAZEPAM 2 MG/ML IJ SOLN: 0.0000 mg | Freq: Four times a day (QID) | INTRAMUSCULAR | Status: DC

## 2024-05-22 MED ORDER — CHLORDIAZEPOXIDE HCL 25 MG PO CAPS
ORAL_CAPSULE | ORAL | 0 refills | Status: DC
Start: 2024-05-22 — End: 2024-09-02

## 2024-05-22 MED ORDER — LORAZEPAM 2 MG/ML IJ SOLN: 0.0000 mg | Freq: Two times a day (BID) | INTRAMUSCULAR | Status: DC

## 2024-05-22 MED ORDER — THIAMINE HCL 100 MG/ML IJ SOLN
100.0000 mg | Freq: Every day | INTRAMUSCULAR | Status: DC
  Administered 2024-05-22: 100 mg via INTRAVENOUS
  Filled 2024-05-22: qty 2

## 2024-05-22 MED ORDER — HYDROCHLOROTHIAZIDE 25 MG PO TABS
25.0000 mg | ORAL_TABLET | Freq: Every day | ORAL | 0 refills | Status: DC
Start: 2024-05-22 — End: 2024-06-10

## 2024-05-22 MED ORDER — LORAZEPAM 1 MG PO TABS: 0.0000 mg | ORAL_TABLET | Freq: Four times a day (QID) | ORAL | Status: DC

## 2024-05-22 MED ORDER — THIAMINE MONONITRATE 100 MG PO TABS: 100.0000 mg | ORAL_TABLET | Freq: Every day | ORAL | Status: DC

## 2024-05-22 MED ORDER — LORAZEPAM 2 MG/ML IJ SOLN
2.0000 mg | Freq: Once | INTRAMUSCULAR | Status: AC
  Administered 2024-05-22: 2 mg via INTRAVENOUS
  Filled 2024-05-22: qty 1

## 2024-05-22 MED ORDER — SODIUM CHLORIDE 0.9 % IV BOLUS
1000.0000 mL | Freq: Once | INTRAVENOUS | Status: AC
  Administered 2024-05-22: 1000 mL via INTRAVENOUS

## 2024-05-22 MED ORDER — AMLODIPINE BESYLATE 10 MG PO TABS: 10.0000 mg | ORAL_TABLET | Freq: Every day | ORAL | 0 refills | Status: DC

## 2024-05-22 MED ORDER — LORAZEPAM 1 MG PO TABS: 0.0000 mg | ORAL_TABLET | Freq: Two times a day (BID) | ORAL | Status: DC

## 2024-05-22 MED ORDER — ONDANSETRON 4 MG PO TBDP: 4.0000 mg | ORAL_TABLET | Freq: Three times a day (TID) | ORAL | 0 refills | Status: AC | PRN

## 2024-05-22 NOTE — ED Provider Notes (Signed)
 Emergency Department Provider Note   I have reviewed the triage vital signs and the nursing notes.   HISTORY  Chief Complaint Alcohol Problem   HPI Cameron Rogers is a 58 y.o. male with past history of alcohol dependence presents to the emergency department with shakes after 3 days of abstaining from drinking.  He would drink 240 ounce beers per day along with some occasional shots of liquor.  No history of seizure or complicated withdrawal in the past.  He did have some vomiting this morning without abdominal or back pain.  No fevers.  He is hoping to go to detox, which he has done in the past.    Past Medical History:  Diagnosis Date   Alcohol dependence (HCC)    Allergy    SEASONAL   Anemia    Arthritis    Heart murmur    Hypertension    Prediabetes    Unexplained weight loss 02/06/2021    Review of Systems  Constitutional: No fever/chills Cardiovascular: Denies chest pain. Respiratory: Denies shortness of breath. Gastrointestinal: No abdominal pain.  No nausea, no vomiting.  No diarrhea.  No constipation. Genitourinary: Negative for dysuria. Musculoskeletal: Negative for back pain. Skin: Negative for rash. Neurological: Negative for headaches, focal weakness or numbness.   ____________________________________________   PHYSICAL EXAM:  VITAL SIGNS: ED Triage Vitals  Encounter Vitals Group     BP 05/22/24 1109 (!) 151/115     Pulse Rate 05/22/24 1109 88     Resp 05/22/24 1109 18     Temp 05/22/24 1109 98.8 F (37.1 C)     Temp Source 05/22/24 1109 Oral     SpO2 05/22/24 1109 100 %     Weight 05/22/24 1113 170 lb (77.1 kg)     Height 05/22/24 1113 6' 2 (1.88 m)   Constitutional: Alert and oriented. Well appearing and in no acute distress. Eyes: Conjunctivae are normal.  Head: Atraumatic. Nose: No congestion/rhinnorhea. Mouth/Throat: Mucous membranes are moist.   Cardiovascular: Normal rate, regular rhythm. Good peripheral circulation. Grossly  normal heart sounds.   Respiratory: Normal respiratory effort.  No retractions. Lungs CTAB. Gastrointestinal: Soft and nontender. No distention.  Musculoskeletal: No lower extremity tenderness nor edema. No gross deformities of extremities. Neurologic:  Normal speech and language. No gross focal neurologic deficits are appreciated.  Very mild tremor of the upper extremities worse with intention. Skin:  Skin is warm, dry and intact. No rash noted.  ____________________________________________   LABS (all labs ordered are listed, but only abnormal results are displayed)  Labs Reviewed  COMPREHENSIVE METABOLIC PANEL WITH GFR - Abnormal; Notable for the following components:      Result Value   Sodium 131 (*)    Chloride 95 (*)    CO2 20 (*)    Glucose, Bld 140 (*)    AST 112 (*)    Anion gap 16 (*)    All other components within normal limits  ETHANOL  CBC  RAPID URINE DRUG SCREEN, HOSP PERFORMED  LIPASE, BLOOD    ____________________________________________   PROCEDURES  Procedure(s) performed:   Procedures  None ____________________________________________   INITIAL IMPRESSION / ASSESSMENT AND PLAN / ED COURSE  Pertinent labs & imaging results that were available during my care of the patient were reviewed by me and considered in my medical decision making (see chart for details).   This patient is Presenting for Evaluation of EtOH withdrawl, which does require a range of treatment options, and is a complaint that  involves a high risk of morbidity and mortality.  The Differential Diagnoses include complicated alcohol withdrawal, mild alcohol withdrawal symptoms, primary intention tremor, etc.  Critical Interventions-    Medications  LORazepam  (ATIVAN ) injection 2 mg (2 mg Intravenous Given 05/22/24 1135)  sodium chloride  0.9 % bolus 1,000 mL (0 mLs Intravenous Stopped 05/22/24 1306)    Reassessment after intervention:  CIWA normalized.   Clinical Laboratory Tests  Ordered, included CBC without leukocytosis or anemia.  No acute kidney injury on CMP.  Lipase normal.  Social Determinants of Health Risk positive EtOH history.   Medical Decision Making: Summary:  The patient presents emergency department with mild alcohol withdrawal symptoms on day 3 of not drinking.  Vitals are largely unremarkable other than hypertension.  Initial CIWA mildly elevated.  Plan for Ativan , fluids, screening blood work.  Reevaluation with update and discussion with patient.  After minimal intervention his CIWA has normalized.  He appears to be a good candidate for outpatient detox with Librium  which I have sent to his pharmacy of choice.  We discussed strict ED return precautions.  List of detox resources provided.  Patient's presentation is most consistent with acute presentation with potential threat to life or bodily function.   Disposition: discharge  ____________________________________________  FINAL CLINICAL IMPRESSION(S) / ED DIAGNOSES  Final diagnoses:  Alcohol withdrawal syndrome without complication (HCC)     NEW OUTPATIENT MEDICATIONS STARTED DURING THIS VISIT:  Discharge Medication List as of 05/22/2024  3:07 PM     START taking these medications   Details  chlordiazePOXIDE  (LIBRIUM ) 25 MG capsule 50mg  PO TID x 1D, then 25-50mg  PO BID X 1D, then 25-50mg  PO QD X 1D, Normal    ondansetron  (ZOFRAN -ODT) 4 MG disintegrating tablet Take 1 tablet (4 mg total) by mouth every 8 (eight) hours as needed., Starting Wed 05/22/2024, Normal        Note:  This document was prepared using Dragon voice recognition software and may include unintentional dictation errors.  Fonda Law, MD, [facility]    Arwen Haseley, Fonda MATSU, MD 05/29/24 [phone]

## 2024-05-22 NOTE — Progress Notes (Signed)
 Chief Complaint  Patient presents with  . Headache    Runny nose, jittery, N/V/D, chills x2-3 days. Not taking any medications for symptoms.    Patient has a past medical history of Hypertension.  HPI  History of Present Illness The patient is a 58 year old male who presents for evaluation of alcohol withdrawal symptoms.  He has been experiencing severe discomfort for the past 3 days, which he describes as progressively worsening. His symptoms include headache, high blood pressure, nausea, and an inability to eat or drink, even struggling to hold a bottle of water. He is feeling anxious and can't stop shaking. He also reports frequent urination and a loss of balance. He admits to regular alcohol consumption, although not daily due to his work schedule. He expresses a desire to quit drinking and smoking.    SOCIAL HISTORY The patient admits to usually drinking alcohol, though not every day, and is trying to quit drinking and smoking cigarettes. He reports consuming 40 oz of alcohol each episode.    Patient is currently on no medication and has no medical condition that may reduce immunity or increase the risk for serious infection.  Tobacco Use History[1]  Review of Systems   Review of systems is otherwise negative except as noted in the HPI and Assessment/MDM  Physical Exam   BP (!) 179/99   Pulse 96   Temp 97 F (36.1 C) (Tympanic)   Resp 20   Ht 1.88 m (6' 2)   Wt 79.4 kg (175 lb)   SpO2 100%   BMI 22.47 kg/m   Physical Exam: Physical Exam Constitutional:      General: Patient is not in acute distress. Pleasant.    Appearance: Seated in wheelchair.   Neurological:     General: No focal deficit present. Tremors all extremities; increased with intention.    Mental Status: alert and oriented to person, place, and time.  HENT:     Eyes:     Conjunctiva/sclera: Conjunctivae normal; sclera clear.     Pupils: Pupils are equal, round, and reactive to light.    Mucus  membranes moist. Cardiovascular:     Heart: Regular rate. Cap refill < 2 sec.    No Lower extremity edema noted. Pulmonary:     Effort: Pulmonary effort is normal. No conversational dyspnea.     Breath sounds: No audible wheezing Musculoskeletal:        General: No apparent joint or muscle limitation.     Neck:  No rigidity; supple.  Psychiatric:        Mood and Affect: Normal affect. Mood good.  Skin:    General: Skin is warm and dry. No rash.  No orders to display    No orders of the defined types were placed in this encounter.   Results for orders placed or performed in visit on 05/22/24  POC SARS-COV-2 SYMPTOMATIC (IDNOW)  Result Value Ref Range   SARS-COV-2 IDNOW Negative Negative   SARS IDNOW Information      A rapid, molecular diagnostic test on the IDNOW.  Negative results should be treated as presumptive and, if inconsistent with clinical symptoms should be confirmed with an alternative molecular assay.  POC Influenza A&B NAT (IDNOW)  Result Value Ref Range   Influenza A RNA Negative Negative   Influenza B RNA Negative Negative   Patient was tested for COVID-19 and influenza due to symptoms of feeling unwell, nausea and headache. Lab work, POC testing and/or xray(s) were reviewed and incorporated into  the decision making process.  1. Alcohol withdrawal syndrome with complication    (CMD)      2. Coarse tremors      3. Nausea and vomiting, unspecified vomiting type  POC SARS-COV-2 SYMPTOMATIC (IDNOW)   POC Influenza A&B NAT (IDNOW)     Assessment & Plan Initial Assessment: 58 year old male presenting with tremors, nausea, high blood pressure, inability to eat or drink, and feeling terrible for 3 days. Symptoms consistent with alcohol withdrawal.  Differential Diagnosis: - Alcohol withdrawal: Shaking, nausea, inability to hold a bottle of water. Requires emergency care for management. Advised to seek emergency care. Ambulance offered but declined; brother will  transport.  Final Assessment: Symptoms consistent with alcohol withdrawal. Requires emergency care for management. Ambulance offered but declined; brother will transport immediately.   Clinical Impression: - Alcohol withdrawal  Disposition: - Discharge/Admission/Transfer/Sign Out: Transfer to emergency room. Requires immediate medical attention for alcohol withdrawal.   Plan: Urgent Care Disposition: Follow up with ED  Anticipated course of illness has been reviewed. If not improving as discussed or in the expected timeframe, patient has been advised to seek follow up with their PCP, UC or the ED.  Management of their illness and symptoms has been discussed. Patient was given verbal and written instructions on symptoms that necessitate return to the UC/ED, and instructed to f/u with UC or PCP if not improving.  Anticipated follow up, or next steps in care was reviewed with the patient.  They were provided with written information for reference.  Patient aware they will be contacted regarding results of any pending laboratory results ONLY if the results are abnormal. If abnormal, appropriate treatment measures will be taken based upon results.  Patient education (verbal/handout) given on diagnosis, pathophysiology, treatment of diagnosis, medications & side effects, exercises, the follow-up plan and supportives measures.  We discussed risks and side effects of medications, and also discussed red flags which would warrant immediate follow-up. They should activate EMS, or 911 if red flag symptoms emerge.  Patient and/or parent/guardian (if applicable) agreed with plan and voiced understanding.  No barriers to adherence perceived by myself.  Electronically signed by Sonny Caldron Arledge  Wed 05/22/2024 10:54 AM      [1] Social History Tobacco Use  Smoking Status Every Day  . Types: Cigarettes  Smokeless Tobacco Never

## 2024-05-22 NOTE — ED Notes (Signed)
 Pt states he wants to go home and does not want admission for withdrawals. Dr. Darra notified

## 2024-05-22 NOTE — Discharge Instructions (Signed)

## 2024-05-22 NOTE — ED Triage Notes (Signed)
 Pt sent from urgent care for alcohol detox. Last drink 3 days ago, startes drinking a couple of 40s/day before that. Denies hx of seizures with withdrawal. Emesis this morning

## 2024-05-22 NOTE — Telephone Encounter (Unsigned)
 Copied from CRM #[id number]. Topic: Clinical - Medication Refill >> May 22, 2024  1:58 PM Yolanda T wrote: Medication: amLODipine  (NORVASC ) 10 MG tablet, metoprolol  succinate (TOPROL -XL) 25 MG 24 hr tablet and hydrochlorothiazide  (HYDRODIURIL ) 25 MG tablet  Has the patient contacted their pharmacy? No (Agent: If no, request that the patient contact the pharmacy for the refill. If patient does not wish to contact the pharmacy document the reason why and proceed with request.) (Agent: If yes, when and what did the pharmacy advise?)  This is the patient's preferred pharmacy:  [facility] [address] [place] - [address] [place] [address] Phone: [phone] Fax: [phone]  Is this the correct pharmacy for this prescription? Yes  Has the prescription been filled recently? Yes  Is the patient out of the medication? Yes  Has the patient been seen for an appointment in the last year OR does the patient have an upcoming appointment? Yes  Can we respond through MyChart? Yes  Agent: Please be advised that Rx refills may take up to 3 business days. We ask that you follow-up with your pharmacy.

## 2024-05-24 NOTE — Telephone Encounter (Signed)
 Unable to refill per protocol, courtesy refill already given, OV needed.  Requested Prescriptions  Pending Prescriptions Disp Refills   amLODipine  (NORVASC ) 10 MG tablet 90 tablet 0    Sig: Take 1 tablet (10 mg total) by mouth daily.     Cardiovascular: Calcium Channel Blockers 2 Failed - 05/24/2024 11:49 AM      Failed - Last BP in normal range    BP Readings from Last 1 Encounters:  05/22/24 (!) 159/100         Failed - Valid encounter within last 6 months    Recent Outpatient Visits           1 year ago Uncontrolled hypertension   [place] [facility], MD   2 years ago Essential hypertension   [place] [facility], MD   2 years ago Prediabetes   [place] [facility] at [facility], MD   2 years ago Essential hypertension   [place] [facility], MD   2 years ago Essential hypertension   [place] [facility] [place], MD              Passed - Last Heart Rate in normal range    Pulse Readings from Last 1 Encounters:  05/22/24 76          metoprolol  succinate (TOPROL -XL) 25 MG 24 hr tablet 90 tablet 3    Sig: Take 1 tablet (25 mg total) by mouth daily.     Cardiovascular:  Beta Blockers Failed - 05/24/2024 11:49 AM      Failed - Last BP in normal range    BP Readings from Last 1 Encounters:  05/22/24 (!) 159/100         Failed - Valid encounter within last 6 months    Recent Outpatient Visits           1 year ago Uncontrolled hypertension   [place] [facility] at [facility], MD   2 years ago Essential hypertension   [place] [facility] at [facility], MD   2 years ago Prediabetes   [place] [facility] at [facility], MD   2 years ago Essential hypertension   [place] [facility] at [facility], MD   2 years ago  Essential hypertension   [place] [facility] at [facility] [place], MD              Passed - Last Heart Rate in normal range    Pulse Readings from Last 1 Encounters:  05/22/24 76

## 2024-05-27 ENCOUNTER — Telehealth: Payer: Self-pay

## 2024-05-27 ENCOUNTER — Telehealth: Payer: Self-pay | Admitting: *Deleted

## 2024-05-27 DIAGNOSIS — F10930 Alcohol use, unspecified with withdrawal, uncomplicated: Secondary | ICD-10-CM

## 2024-05-27 NOTE — Progress Notes (Signed)
 Complex Care Management Note Care Guide Note  05/27/2024 Name: Divit Stipp MRN: [id number] DOB: 13-Oct-1966   Complex Care Management Outreach Attempts: An unsuccessful telephone outreach was attempted today to offer the patient information about available complex care management services.  Follow Up Plan:  Additional outreach attempts will be made to offer the patient complex care management information and services.   Encounter Outcome:  No Answer  Harlene Satterfield  [facility]  [facility], [facility] Guide  Direct Dial: [phone]  Fax [phone]

## 2024-05-30 NOTE — Progress Notes (Signed)
 Complex Care Management Note Care Guide Note  05/30/2024 Name: Cameron Rogers MRN: [id number] DOB: 1966-09-26   Complex Care Management Outreach Attempts: A third unsuccessful outreach was attempted today to offer the patient with information about available complex care management services.  Follow Up Plan:  No further outreach attempts will be made at this time. We have been unable to contact the patient to offer or enroll patient in complex care management services.  Encounter Outcome:  No Answer  Harlene Satterfield  [facility]  [facility], [facility] Guide  Direct Dial: [phone]  Fax [phone]

## 2024-06-10 ENCOUNTER — Telehealth: Payer: Self-pay

## 2024-06-10 ENCOUNTER — Other Ambulatory Visit: Payer: Self-pay | Admitting: Family Medicine

## 2024-06-10 NOTE — Telephone Encounter (Signed)
 Copied from CRM #[id number]. Topic: Clinical - Request for Lab/Test Order >> Jun 10, 2024  4:26 PM Winona R wrote: Pt requesting blood work following ER and Urgent care visit due to Blood pressure issues

## 2024-06-10 NOTE — Telephone Encounter (Unsigned)
 Copied from CRM #[id number]. Topic: Clinical - Medication Refill >> Jun 10, 2024  4:37 PM Winona R wrote: Medication: hydrochlorothiazide  (HYDRODIURIL ) 25 MG tablet [[id number]]  Has the patient contacted their pharmacy? Yes (Agent: If no, request that the patient contact the pharmacy for the refill. If patient does not wish to contact the pharmacy document the reason why and proceed with request.) (Agent: If yes, when and what did the pharmacy advise?)  This is the patient's preferred pharmacy:  [facility] [address] [place] - [address] [place] [address] Phone: [phone] Fax: [phone]   Is this the correct pharmacy for this prescription? Yes If no, delete pharmacy and type the correct one.   Has the prescription been filled recently? Yes at the hospital   Is the patient out of the medication? Yes  Has the patient been seen for an appointment in the last year OR does the patient have an upcoming appointment? Yes  Can we respond through MyChart? No  Agent: Please be advised that Rx refills may take up to 3 business days. We ask that you follow-up with your pharmacy.

## 2024-06-10 NOTE — Telephone Encounter (Unsigned)
 Copied from CRM #[id number]. Topic: Clinical - Medication Refill >> Jun 10, 2024  4:33 PM Winona R wrote: Medication:  amLODipine  (NORVASC ) 10 MG tablet metoprolol  succinate (TOPROL -XL) 25 MG 24 hr tablet [[id number]]  Has the patient contacted their pharmacy? No (Agent: If no, request that the patient contact the pharmacy for the refill. If patient does not wish to contact the pharmacy document the reason why and proceed with request.) (Agent: If yes, when and what did the pharmacy advise?)  This is the patient's preferred pharmacy:  [facility] [address], [place] - [address] [place] [address] Phone: [phone] Fax: [phone] Is this the correct pharmacy for this prescription? Yes If no, delete pharmacy and type the correct one.   Has the prescription been filled recently? No  Is the patient out of the medication? Yes  Has the patient been seen for an appointment in the last year OR does the patient have an upcoming appointment? Yes  Can we respond through MyChart? No  Agent: Please be advised that Rx refills may take up to 3 business days. We ask that you follow-up with your pharmacy.

## 2024-06-11 NOTE — Telephone Encounter (Signed)
 Requested medications are due for refill today.  unsure  Requested medications are on the active medications list.  yes  Last refill. 05/20/2024 #30 0 rf  Future visit scheduled.   yes  Notes to clinic.  Rx signed by Fonda Law MD    Requested Prescriptions  Pending Prescriptions Disp Refills   hydrochlorothiazide  (HYDRODIURIL ) 25 MG tablet 30 tablet 0    Sig: Take 1 tablet (25 mg total) by mouth daily.     Cardiovascular: Diuretics - Thiazide Failed - 06/11/2024  5:57 PM      Failed - Na in normal range and within 180 days    Sodium  Date Value Ref Range Status  05/22/2024 131 (L) 135 - 145 mmol/L Final  01/04/2023 140 134 - 144 mmol/L Final         Failed - Last BP in normal range    BP Readings from Last 1 Encounters:  05/22/24 (!) 159/100         Failed - Valid encounter within last 6 months    Recent Outpatient Visits           1 year ago Uncontrolled hypertension   [facility], MD   2 years ago Essential hypertension   [place] [facility], MD   2 years ago Prediabetes   [facility], MD   2 years ago Essential hypertension   [facility], MD   2 years ago Essential hypertension    [facility] [place], [place]              Passed - Cr in normal range and within 180 days    Creatinine, Ser  Date Value Ref Range Status  05/22/2024 0.93 0.61 - 1.24 mg/dL Final         Passed - K in normal range and within 180 days    Potassium  Date Value Ref Range Status  05/22/2024 4.2 3.5 - 5.1 mmol/L Final

## 2024-06-14 MED ORDER — HYDROCHLOROTHIAZIDE 25 MG PO TABS: 25.0000 mg | ORAL_TABLET | Freq: Every day | ORAL | 0 refills | Status: DC

## 2024-06-16 ENCOUNTER — Other Ambulatory Visit: Payer: Self-pay | Admitting: Family Medicine

## 2024-06-16 DIAGNOSIS — I10 Essential (primary) hypertension: Secondary | ICD-10-CM

## 2024-06-21 ENCOUNTER — Ambulatory Visit
Admission: EM | Admit: 2024-06-21 | Discharge: 2024-06-21 | Disposition: A | Discharge status: Home / Self Care | Attending: Family Medicine | Admitting: Family Medicine

## 2024-06-21 ENCOUNTER — Emergency Department (HOSPITAL_COMMUNITY)

## 2024-06-21 ENCOUNTER — Emergency Department (HOSPITAL_COMMUNITY): Admission: EM | Admit: 2024-06-21 | Discharge: 2024-06-21 | Disposition: A | Discharge status: Home / Self Care

## 2024-06-21 ENCOUNTER — Encounter: Payer: Self-pay | Admitting: Emergency Medicine

## 2024-06-21 ENCOUNTER — Other Ambulatory Visit: Payer: Self-pay

## 2024-06-21 DIAGNOSIS — K921 Melena: Secondary | ICD-10-CM | POA: Diagnosis not present

## 2024-06-21 DIAGNOSIS — N400 Enlarged prostate without lower urinary tract symptoms: Secondary | ICD-10-CM | POA: Insufficient documentation

## 2024-06-21 DIAGNOSIS — I10 Essential (primary) hypertension: Secondary | ICD-10-CM | POA: Diagnosis not present

## 2024-06-21 DIAGNOSIS — R319 Hematuria, unspecified: Secondary | ICD-10-CM | POA: Diagnosis not present

## 2024-06-21 DIAGNOSIS — K529 Noninfective gastroenteritis and colitis, unspecified: Secondary | ICD-10-CM | POA: Diagnosis not present

## 2024-06-21 DIAGNOSIS — R232 Flushing: Secondary | ICD-10-CM

## 2024-06-21 DIAGNOSIS — R109 Unspecified abdominal pain: Secondary | ICD-10-CM | POA: Diagnosis not present

## 2024-06-21 DIAGNOSIS — Z79899 Other long term (current) drug therapy: Secondary | ICD-10-CM | POA: Diagnosis not present

## 2024-06-21 DIAGNOSIS — E876 Hypokalemia: Secondary | ICD-10-CM | POA: Insufficient documentation

## 2024-06-21 DIAGNOSIS — R110 Nausea: Secondary | ICD-10-CM | POA: Diagnosis not present

## 2024-06-21 DIAGNOSIS — E871 Hypo-osmolality and hyponatremia: Secondary | ICD-10-CM | POA: Diagnosis not present

## 2024-06-21 DIAGNOSIS — Z720 Tobacco use: Secondary | ICD-10-CM | POA: Diagnosis not present

## 2024-06-21 DIAGNOSIS — R630 Anorexia: Secondary | ICD-10-CM

## 2024-06-21 DIAGNOSIS — R1012 Left upper quadrant pain: Secondary | ICD-10-CM | POA: Diagnosis present

## 2024-06-21 DIAGNOSIS — R350 Frequency of micturition: Secondary | ICD-10-CM | POA: Diagnosis not present

## 2024-06-21 LAB — TYPE AND SCREEN
ABO/RH(D): O POS
Antibody Screen: NEGATIVE

## 2024-06-21 LAB — BASIC METABOLIC PANEL WITH GFR
Anion gap: 18 — ABNORMAL HIGH (ref 5–15)
BUN: 14 mg/dL (ref 6–20)
CO2: 21 mmol/L — ABNORMAL LOW (ref 22–32)
Calcium: 9.6 mg/dL (ref 8.9–10.3)
Chloride: 86 mmol/L — ABNORMAL LOW (ref 98–111)
Creatinine, Ser: 1.08 mg/dL (ref 0.61–1.24)
GFR, Estimated: >60 mL/min (ref >60)
Glucose, Bld: 97 mg/dL (ref 70–99)
Potassium: 3 mmol/L — ABNORMAL LOW (ref 3.5–5.1)
Sodium: 125 mmol/L — ABNORMAL LOW (ref 135–145)

## 2024-06-21 LAB — URINALYSIS, ROUTINE W REFLEX MICROSCOPIC
Bacteria, UA: NONE SEEN
Bilirubin Urine: NEGATIVE
Glucose, UA: NEGATIVE mg/dL
Ketones, ur: 5 mg/dL — AB
Leukocytes,Ua: NEGATIVE
Nitrite: NEGATIVE
Protein, ur: 30 mg/dL — AB
Specific Gravity, Urine: 1.02 (ref 1.005–1.030)
pH: 6 (ref 5.0–8.0)

## 2024-06-21 LAB — POCT URINE DIPSTICK
Bilirubin, UA: NEGATIVE
Glucose, UA: NEGATIVE mg/dL
Ketones, POC UA: NEGATIVE mg/dL
Leukocytes, UA: NEGATIVE
Nitrite, UA: NEGATIVE
Protein Ur, POC: 100 mg/dL — AB
Spec Grav, UA: 1.02 (ref 1.010–1.025)
Urobilinogen, UA: 0.2 U/dL
pH, UA: 6 (ref 5.0–8.0)

## 2024-06-21 LAB — CBC
HCT: 43.2 % (ref 39.0–52.0)
Hemoglobin: 15.7 g/dL (ref 13.0–17.0)
MCH: 31.3 pg (ref 26.0–34.0)
MCHC: 36.3 g/dL — ABNORMAL HIGH (ref 30.0–36.0)
MCV: 86.1 fL (ref 80.0–100.0)
Platelets: 205 K/uL (ref 150–400)
RBC: 5.02 MIL/uL (ref 4.22–5.81)
RDW: 12.5 % (ref 11.5–15.5)
WBC: 4.2 K/uL (ref 4.0–10.5)
nRBC: 0 % (ref 0.0–0.2)

## 2024-06-21 LAB — GLUCOSE, POCT (MANUAL RESULT ENTRY): POCT Glucose (KUC): 104 mg/dL — AB (ref 70–99)

## 2024-06-21 LAB — POC OCCULT BLOOD, ED: Fecal Occult Bld: NEGATIVE

## 2024-06-21 MED ORDER — AMOXICILLIN-POT CLAVULANATE 875-125 MG PO TABS: 1.0000 | ORAL_TABLET | Freq: Once | ORAL | Status: DC

## 2024-06-21 MED ORDER — LACTATED RINGERS IV BOLUS
1000.0000 mL | Freq: Once | INTRAVENOUS | Status: AC
  Administered 2024-06-21: 1000 mL via INTRAVENOUS

## 2024-06-21 MED ORDER — IOHEXOL 300 MG/ML  SOLN
100.0000 mL | Freq: Once | INTRAMUSCULAR | Status: AC | PRN
  Administered 2024-06-21: 100 mL via INTRAVENOUS

## 2024-06-21 MED ORDER — MORPHINE SULFATE (PF) 4 MG/ML IV SOLN
4.0000 mg | Freq: Once | INTRAVENOUS | Status: AC
  Administered 2024-06-21: 4 mg via INTRAVENOUS
  Filled 2024-06-21 (×2): qty 1

## 2024-06-21 MED ORDER — ONDANSETRON HCL 4 MG/2ML IJ SOLN
4.0000 mg | Freq: Once | INTRAMUSCULAR | Status: AC
  Administered 2024-06-21: 4 mg via INTRAVENOUS
  Filled 2024-06-21: qty 2

## 2024-06-21 MED ORDER — HYDROCHLOROTHIAZIDE 25 MG PO TABS: 25.0000 mg | ORAL_TABLET | Freq: Every day | ORAL | 0 refills | Status: DC

## 2024-06-21 MED ORDER — AMLODIPINE BESYLATE 10 MG PO TABS: 10.0000 mg | ORAL_TABLET | Freq: Every day | ORAL | 0 refills | Status: DC

## 2024-06-21 MED ORDER — POTASSIUM CHLORIDE CRYS ER 20 MEQ PO TBCR
40.0000 meq | EXTENDED_RELEASE_TABLET | Freq: Once | ORAL | Status: AC
  Administered 2024-06-21: 40 meq via ORAL
  Filled 2024-06-21: qty 2

## 2024-06-21 MED ORDER — METOPROLOL SUCCINATE ER 25 MG PO TB24: 25.0000 mg | ORAL_TABLET | Freq: Every day | ORAL | 0 refills | Status: DC

## 2024-06-21 MED ORDER — AMOXICILLIN-POT CLAVULANATE 875-125 MG PO TABS: 1.0000 | ORAL_TABLET | Freq: Two times a day (BID) | ORAL | 0 refills | Status: AC

## 2024-06-21 MED ORDER — POTASSIUM CHLORIDE CRYS ER 20 MEQ PO TBCR: 20.0000 meq | EXTENDED_RELEASE_TABLET | Freq: Two times a day (BID) | ORAL | 0 refills | Status: AC

## 2024-06-21 NOTE — Discharge Instructions (Addendum)
 Your sodium levels were low here today.  Please eat and drink normally over the next few days and if you want to drink some Gatorade's that should help.  Please discontinue your HCTZ medication as this does lead to hyponatremia.  Please call and schedule a follow-up appointment with the urologist as your prostate was enlarged on CT scan.  Please follow-up with the GI doctor for a colonoscopy with the blood in 2 noticed.  Return to the ER for worsening symptoms.

## 2024-06-21 NOTE — ED Notes (Signed)
 Patient is being discharged from the Urgent Care and sent to the Emergency Department via EMS. Per Dr Darral, patient is in need of higher level of care due to abdominal pain, nausea, blood in urine and stools. Patient is aware and verbalizes understanding of plan of care.  Vitals:   06/21/24 0844  BP: (!) 143/84  Pulse: 79  Resp: 16  Temp: 98.1 F (36.7 C)  SpO2: 96%

## 2024-06-21 NOTE — ED Provider Notes (Signed)
 [name]    CSN: [id number] Arrival date & time: 06/21/24  0807      History   Chief Complaint Chief Complaint  Patient presents with   Urinary Frequency    Dark urine    Diarrhea   Blood In Stools   Abdominal Pain    HPI Cameron Rogers is a 58 y.o. male.    Urinary Frequency Associated symptoms include abdominal pain.  Diarrhea Associated symptoms: abdominal pain   Abdominal Pain Associated symptoms: diarrhea, fatigue, hematuria and nausea    Patient is here for diarrhea x 4 days.  This morning noted blood in the stool.  Was in the water.   It was a teaspoon amount of blood.  Having abdominal pain, lower.  That started about 3 days ago.  It did improve but then back again today.  He is having nausea, no vomiting.  The urine was dark today.  Also some frequency.  He has not had anything to eat in 4 days, but drinking okay.  He did drink several beers on Wednesday, but none since.  Some chills/hot flashes.  No known fevers, but having sweats.   He does need a refill of his bp medications today.   He thinks he is losing a lot of weight.         Past Medical History:  Diagnosis Date   Alcohol dependence (HCC)    Allergy    SEASONAL   Anemia    Arthritis    Heart murmur    Hypertension    Prediabetes    Unexplained weight loss 02/06/2021    Patient Active Problem List   Diagnosis Date Noted   Angioedema 07/15/2021   Protein-calorie malnutrition, severe 02/08/2021   Alcohol withdrawal (HCC) 02/06/2021   Alcohol dependence with uncomplicated withdrawal (HCC) 02/06/2021   Nausea, vomiting, and diarrhea 02/06/2021   Groin rash 02/06/2021   Unexplained weight loss 02/06/2021   Hyponatremia 02/05/2021   Acute upper GI bleed 02/26/2020   COVID-19 virus infection 02/26/2020   Essential hypertension 11/28/2019   Neuropathy, alcoholic (HCC) 11/28/2019   Elevated PSA 11/28/2019   Elevated liver function tests 11/28/2019   Prediabetes  11/28/2019   Alcohol abuse 11/28/2019    Past Surgical History:  Procedure Laterality Date   NO PAST SURGERIES         Home Medications    Prior to Admission medications   Medication Sig Start Date End Date Taking? Authorizing Provider  amLODipine  (NORVASC ) 10 MG tablet Take 1 tablet (10 mg total) by mouth daily. 05/22/24  Yes Long, Fonda MATSU, MD  chlordiazePOXIDE  (LIBRIUM ) 25 MG capsule 50mg  PO TID x 1D, then 25-50mg  PO BID X 1D, then 25-50mg  PO QD X 1D 05/22/24  Yes Long, Joshua G, MD  hydrochlorothiazide  (HYDRODIURIL ) 25 MG tablet Take 1 tablet (25 mg total) by mouth daily. 06/14/24  Yes Tanda Bleacher, MD  metoprolol  succinate (TOPROL -XL) 25 MG 24 hr tablet Take 1 tablet (25 mg total) by mouth daily. 01/30/23  Yes Tanda Bleacher, MD  ondansetron  (ZOFRAN -ODT) 4 MG disintegrating tablet Take 1 tablet (4 mg total) by mouth every 8 (eight) hours as needed. 05/22/24   Long, Fonda MATSU, MD  promethazine  (PHENERGAN ) 25 MG tablet Take 1 tablet (25 mg total) by mouth every 8 (eight) hours as needed for nausea or vomiting. 04/25/17 04/02/19  Patsey Lot, MD    Family History Family History  Problem Relation Age of Onset   Diabetes Mother    Breast cancer  Sister    Diabetes Sister    Heart murmur Sister    Diabetes Brother    Colon cancer Neg Hx    Colon polyps Neg Hx    Esophageal cancer Neg Hx    Rectal cancer Neg Hx    Stomach cancer Neg Hx    Ovarian cancer Neg Hx     Social History Social History   Tobacco Use   Smoking status: Every Day    Current packs/day: 0.50    Average packs/day: 0.5 packs/day for 38.0 years (19.0 ttl pk-yrs)    Types: Cigarettes    Passive exposure: Current   Smokeless tobacco: Never  Vaping Use   Vaping status: Never Used  Substance Use Topics   Alcohol use: Not Currently   Drug use: No     Allergies   Doxycycline  and Lisinopril    Review of Systems Review of Systems  Constitutional:  Positive for fatigue.  HENT: Negative.     Respiratory: Negative.    Gastrointestinal:  Positive for abdominal pain, blood in stool, diarrhea and nausea.  Genitourinary:  Positive for frequency and hematuria.  Musculoskeletal: Negative.   Skin: Negative.   Psychiatric/Behavioral: Negative.       Physical Exam Triage Vital Signs ED Triage Vitals  Encounter Vitals Group     BP 06/21/24 0844 (!) 143/84     Girls Systolic BP Percentile --      Girls Diastolic BP Percentile --      Boys Systolic BP Percentile --      Boys Diastolic BP Percentile --      Pulse Rate 06/21/24 0844 79     Resp 06/21/24 0844 16     Temp 06/21/24 0844 98.1 F (36.7 C)     Temp Source 06/21/24 0844 Oral     SpO2 06/21/24 0844 96 %     Weight 06/21/24 0843 169 lb 15.6 oz (77.1 kg)     Height --      Head Circumference --      Peak Flow --      Pain Score 06/21/24 0843 5     Pain Loc --      Pain Education --      Exclude from Growth Chart --    No data found.  Updated Vital Signs BP (!) 143/84 (BP Location: Left Arm)   Pulse 79   Temp 98.1 F (36.7 C) (Oral)   Resp 16   Wt 77.1 kg   SpO2 96%   BMI 21.82 kg/m   Visual Acuity Right Eye Distance:   Left Eye Distance:   Bilateral Distance:    Right Eye Near:   Left Eye Near:    Bilateral Near:     Physical Exam Constitutional:      General: He is not in acute distress.    Appearance: He is normal weight. He is not ill-appearing.  Cardiovascular:     Rate and Rhythm: Normal rate and regular rhythm.  Pulmonary:     Effort: Pulmonary effort is normal.     Breath sounds: Normal breath sounds.  Abdominal:     General: Bowel sounds are increased.     Palpations: Abdomen is soft.     Tenderness: There is generalized abdominal tenderness. There is guarding. There is no right CVA tenderness or left CVA tenderness.  Skin:    General: Skin is warm.  Neurological:     General: No focal deficit present.     Mental Status: He is  alert.      UC Treatments / Results  Labs (all  labs ordered are listed, but only abnormal results are displayed) Labs Reviewed  POCT URINE DIPSTICK - Abnormal; Notable for the following components:      Result Value   Color, UA straw (*)    Clarity, UA hazy (*)    Blood, UA moderate (*)    Protein Ur, POC =100 (*)    All other components within normal limits  GLUCOSE, POCT (MANUAL RESULT ENTRY) - Abnormal; Notable for the following components:   POCT Glucose ([facility]) 104 (*)    All other components within normal limits    EKG   Radiology No results found.  Procedures Procedures (including critical care time)  Medications Ordered in UC Medications - No data to display  Initial Impression / Assessment and Plan / UC Course  I have reviewed the triage vital signs and the nursing notes.  Pertinent labs & imaging results that were available during my care of the patient were reviewed by me and considered in my medical decision making (see chart for details).  Patient was seen today for various symptoms.  Given the extent, I am unable to fully work this up today.  He does not have transportation, and is requesting transport to the ER.  Nonemergent  transport was called.     Final Clinical Impressions(s) / UC Diagnoses   Final diagnoses:  Urinary frequency  Abdominal pain, unspecified abdominal location  Nausea without vomiting  Blood in stool  Hot flashes  Hematuria, unspecified type  Anorexia  Essential hypertension     Discharge Instructions      You were seen today for various symptoms.  Given the extent, I have recommended you go to the ER for evaluation.  I have refilled your blood pressure medications per your request.     ED Prescriptions     Medication Sig Dispense Auth. Provider   hydrochlorothiazide  (HYDRODIURIL ) 25 MG tablet Take 1 tablet (25 mg total) by mouth daily. 30 tablet Keira Bohlin, MD   metoprolol  succinate (TOPROL -XL) 25 MG 24 hr tablet Take 1 tablet (25 mg total) by mouth daily. 30 tablet  Remee Charley, MD   amLODipine  (NORVASC ) 10 MG tablet Take 1 tablet (10 mg total) by mouth daily. 30 tablet Darral Longs, MD      PDMP not reviewed this encounter.   Darral Longs, MD 06/21/24 [phone]

## 2024-06-21 NOTE — ED Provider Notes (Signed)
 [place] EMERGENCY DEPARTMENT AT [facility] Provider Note   CSN: [id number] Arrival date & time: 06/21/24  1025     Patient presents with: Hematuria   Cameron Rogers is a 58 y.o. male.   58 year old male with past medical history of tobacco and alcohol abuse as well as hypertension presenting to the emergency department today with abdominal pain.  The patient was sent by his primary care provider due to abdominal pain.  The patient states that he did go to the bathroom this morning and noticed some blood when he wiped.  He denied any blood in his stool.  He denies any associated nausea or vomiting.  He states that he has been having some diffuse abdominal discomfort over the past few days.  He denies any fevers.  He states that he does drink daily but will quantify how much.  He denies ever having a colonoscopy before.   Hematuria Associated symptoms include abdominal pain.       Prior to Admission medications   Medication Sig Start Date End Date Taking? Authorizing Provider  amoxicillin -clavulanate (AUGMENTIN ) 875-125 MG tablet Take 1 tablet by mouth every 12 (twelve) hours. 06/21/24  Yes Ula Prentice SAUNDERS, MD  potassium chloride  SA (KLOR-CON  M) 20 MEQ tablet Take 1 tablet (20 mEq total) by mouth 2 (two) times daily. 06/21/24  Yes Ula Prentice SAUNDERS, MD  amLODipine  (NORVASC ) 10 MG tablet Take 1 tablet (10 mg total) by mouth daily. 06/21/24   Piontek, Rocky, MD  chlordiazePOXIDE  (LIBRIUM ) 25 MG capsule 50mg  PO TID x 1D, then 25-50mg  PO BID X 1D, then 25-50mg  PO QD X 1D 05/22/24   Long, Joshua G, MD  metoprolol  succinate (TOPROL -XL) 25 MG 24 hr tablet Take 1 tablet (25 mg total) by mouth daily. 06/21/24   Piontek, Rocky, MD  ondansetron  (ZOFRAN -ODT) 4 MG disintegrating tablet Take 1 tablet (4 mg total) by mouth every 8 (eight) hours as needed. 05/22/24   Long, Fonda MATSU, MD  promethazine  (PHENERGAN ) 25 MG tablet Take 1 tablet (25 mg total) by mouth every 8 (eight) hours as needed for nausea or  vomiting. 04/25/17 04/02/19  Patsey Lot, MD    Allergies: Doxycycline  and Lisinopril     Review of Systems  Gastrointestinal:  Positive for abdominal pain.  Genitourinary:  Positive for hematuria.  All other systems reviewed and are negative.   Updated Vital Signs BP (!) 147/88   Pulse 98   Temp 98.4 F (36.9 C) (Oral)   Resp 16   SpO2 96%   Physical Exam Vitals and nursing note reviewed.   Gen: NAD Eyes: PERRL, EOMI HEENT: no oropharyngeal swelling Neck: trachea midline Resp: clear to auscultation bilaterally Card: RRR, no murmurs, rubs, or gallops Abd: Diffusely tender with maximal tenderness over the epigastrium and left upper quadrant with guarding to deep palpation Rectal: Chaperoned by male nursing staff does show an external hemorrhoid that does not appear to be bleeding, no anal fissures noted, brown stool noted with no gross bleeding Extremities: no calf tenderness, no edema Vascular: 2+ radial pulses bilaterally, 2+ DP pulses bilaterally Skin: no rashes Psyc: acting appropriately   (all labs ordered are listed, but only abnormal results are displayed) Labs Reviewed  URINALYSIS, ROUTINE W REFLEX MICROSCOPIC - Abnormal; Notable for the following components:      Result Value   Hgb urine dipstick SMALL (*)    Ketones, ur 5 (*)    Protein, ur 30 (*)    All other components within normal limits  BASIC METABOLIC PANEL WITH GFR - Abnormal; Notable for the following components:   Sodium 125 (*)    Potassium 3.0 (*)    Chloride 86 (*)    CO2 21 (*)    Anion gap 18 (*)    All other components within normal limits  CBC - Abnormal; Notable for the following components:   MCHC 36.3 (*)    All other components within normal limits  POC OCCULT BLOOD, ED  TYPE AND SCREEN    EKG: None  Radiology: CT ABDOMEN PELVIS W CONTRAST Result Date: 06/21/2024 CLINICAL DATA:  One episode of red blood in stool this morning. EXAM: CT ABDOMEN AND PELVIS WITH CONTRAST  TECHNIQUE: Multidetector CT imaging of the abdomen and pelvis was performed using the standard protocol following bolus administration of intravenous contrast. RADIATION DOSE REDUCTION: This exam was performed according to the departmental dose-optimization program which includes automated exposure control, adjustment of the mA and/or kV according to patient size and/or use of iterative reconstruction technique. CONTRAST:  OMNIPAQUE  IOHEXOL  300 MG/ML  SOLN COMPARISON:  August 09, 2022 FINDINGS: Lower chest: No acute abnormality. Hepatobiliary: There is diffuse fatty infiltration of the liver parenchyma. No focal liver abnormality is seen. No gallstones, gallbladder wall thickening, or biliary dilatation. Pancreas: Unremarkable. No pancreatic ductal dilatation or surrounding inflammatory changes. Spleen: Normal in size without focal abnormality. Adrenals/Urinary Tract: Adrenal glands are unremarkable. Kidneys are normal, without renal calculi, focal lesion, or hydronephrosis. Bladder is unremarkable. Stomach/Bowel: Stomach is within normal limits. Appendix appears normal. No evidence of bowel dilatation. The ascending colon and transverse colon are mildly thickened. Vascular/Lymphatic: Aortic atherosclerosis. No enlarged abdominal or pelvic lymph nodes. Reproductive: The prostate gland is markedly enlarged. Other: No abdominal wall hernia or abnormality. No abdominopelvic ascites. Musculoskeletal: No acute or significant osseous findings. IMPRESSION: 1. Mildly thickened ascending colon and transverse colon, which may represent mild colitis. 2. Hepatic steatosis. 3. Markedly enlarged prostate gland. Correlation with PSA levels is recommended. 4. Aortic atherosclerosis. Electronically Signed   By: Suzen Dials M.D.   On: 06/21/2024 12:13     Procedures   Medications Ordered in the ED  amoxicillin -clavulanate (AUGMENTIN ) 875-125 MG per tablet 1 tablet (has no administration in time range)  morphine   (PF) 4 MG/ML injection 4 mg (4 mg Intravenous Given 06/21/24 1113)  ondansetron  (ZOFRAN ) injection 4 mg (4 mg Intravenous Given 06/21/24 1113)  lactated ringers  bolus 1,000 mL (1,000 mLs Intravenous New Bag/Given 06/21/24 1113)  iohexol  (OMNIPAQUE ) 300 MG/ML solution 100 mL (100 mLs Intravenous Contrast Given 06/21/24 1152)  potassium chloride  SA (KLOR-CON  M) CR tablet 40 mEq (40 mEq Oral Given 06/21/24 1406)                                    Medical Decision Making 58 year old male with past medical history tobacco abuse, alcohol abuse, and hypertension presenting to the emergency department today with abdominal pain and 1 episode of minimal rectal bleeding.  I will further evaluate patient here with basic labs as well as type and screen the patient does require transfusion.  Will obtain a CT scan of his abdomen to evaluate for diverticulitis, colitis, pancreatitis, or hepatobiliary pathology as well as malignancy.  I will give patient morphine  Zofran  for symptom as well as IV fluids and reevaluate for ultimate disposition.  The patient's sodium was low at 125.  Potassium is also low at 3.0.  Patient is given  oral potassium here.  He did appear clinically dry was given IV fluids initially.  He is on a CT as well as medication list.  He is found to have colitis and a large prostate on CT.  I did discuss admission with the patient given these multiple findings.  He does not want to stay in the hospital.  He states he is feeling better.  He seems to be asymptomatic with the hyponatremia.  We discussed admission versus discharge with outpatient follow-up and I did discuss risks of worsening hyponatremia including seizures, debility, and death.  The patient does not want to stay here in the hospital and would rather follow-up.  He does have decision made capacity.  He is ultimately discharged through shared decision making with return precautions.  I will cover him with Augmentin  for the colitis.  Amount and/or  Complexity of Data Reviewed Labs: ordered. Radiology: ordered.  Risk Prescription drug management.        Final diagnoses:  Hematuria, unspecified type  Enlarged prostate  Colitis  Hyponatremia  Hypokalemia    ED Discharge Orders          Ordered    potassium chloride  SA (KLOR-CON  M) 20 MEQ tablet  2 times daily        06/21/24 1524    amoxicillin -clavulanate (AUGMENTIN ) 875-125 MG tablet  Every 12 hours        06/21/24 1526               Ula Prentice SAUNDERS, MD 06/21/24 1526

## 2024-06-21 NOTE — ED Notes (Signed)
 Report given to [facility] at bedside.

## 2024-06-21 NOTE — ED Notes (Signed)
 Asked patient if he could provide a urine sample, he said not right now. Will try again within 30 minutes.

## 2024-06-21 NOTE — ED Triage Notes (Signed)
 Pt BIBA from urgent care for further evaluation of hematuria. One episode of red blood in stool this morning  130/90 HR 100 98% RA 104 cbg 97.3 temp

## 2024-06-21 NOTE — ED Notes (Signed)
 Non-Emergent EMS transport notified for patient to go to ED.

## 2024-06-21 NOTE — ED Triage Notes (Addendum)
 Pt presents c/o urinary frequency and diarrhea x 3 days. Pt also reports blood in his stool as of this morning. Pt reports his urine has been dark for about 2 days and he has not been able to eat in 4 days. Lastly, pt c/o lower abd pain x 2 days. Pt denies any additional sxs.

## 2024-06-21 NOTE — Discharge Instructions (Addendum)
 You were seen today for various symptoms.  Given the extent, I have recommended you go to the ER for evaluation.  I have refilled your blood pressure medications per your request.

## 2024-07-24 ENCOUNTER — Ambulatory Visit: Admitting: Family Medicine

## 2024-07-24 ENCOUNTER — Encounter: Payer: Self-pay | Admitting: Family Medicine

## 2024-07-24 VITALS — BP 163/99 | HR 94 | Ht 74.0 in | Wt 167.4 lb

## 2024-07-24 DIAGNOSIS — E871 Hypo-osmolality and hyponatremia: Secondary | ICD-10-CM | POA: Diagnosis not present

## 2024-07-24 DIAGNOSIS — E876 Hypokalemia: Secondary | ICD-10-CM

## 2024-07-24 DIAGNOSIS — I10 Essential (primary) hypertension: Secondary | ICD-10-CM | POA: Diagnosis not present

## 2024-07-24 MED ORDER — AMLODIPINE BESYLATE 10 MG PO TABS: 10.0000 mg | ORAL_TABLET | Freq: Every day | ORAL | 0 refills | Status: AC

## 2024-07-24 MED ORDER — METOPROLOL SUCCINATE ER 50 MG PO TB24: 50.0000 mg | ORAL_TABLET | Freq: Every day | ORAL | 3 refills | Status: AC

## 2024-07-24 MED ORDER — HYDROCHLOROTHIAZIDE 25 MG PO TABS: 25.0000 mg | ORAL_TABLET | Freq: Every day | ORAL | 0 refills | Status: AC

## 2024-07-24 NOTE — Progress Notes (Signed)
 Established Patient Office Visit  Subjective    Patient ID: Cameron Rogers, male    DOB: Jul 14, 1966  Age: 58 y.o. MRN: [id number]  CC:  Chief Complaint  Patient presents with   Medical Management of Chronic Issues    HPI Cameron Rogers presents for follow up of hypertension. Patient reports that he has not been taking his hydrochlorothiazide  but has been compliant with the other meds. He was recently seen in ED and was noted to have hypokalemia and was told to stop the med until he was seen for follow up.   Outpatient Encounter Medications as of 07/24/2024  Medication Sig   chlordiazePOXIDE  (LIBRIUM ) 25 MG capsule 50mg  PO TID x 1D, then 25-50mg  PO BID X 1D, then 25-50mg  PO QD X 1D   metoprolol  succinate (TOPROL -XL) 25 MG 24 hr tablet Take 1 tablet (25 mg total) by mouth daily.   metoprolol  succinate (TOPROL -XL) 50 MG 24 hr tablet Take 1 tablet (50 mg total) by mouth daily. Take with or immediately following a meal.   potassium chloride  SA (KLOR-CON  M) 20 MEQ tablet Take 1 tablet (20 mEq total) by mouth 2 (two) times daily.   [DISCONTINUED] amLODipine  (NORVASC ) 10 MG tablet Take 1 tablet (10 mg total) by mouth daily.   [DISCONTINUED] hydrochlorothiazide  (HYDRODIURIL ) 25 MG tablet Take 25 mg by mouth daily.   amLODipine  (NORVASC ) 10 MG tablet Take 1 tablet (10 mg total) by mouth daily.   amoxicillin -clavulanate (AUGMENTIN ) 875-125 MG tablet Take 1 tablet by mouth every 12 (twelve) hours.   hydrochlorothiazide  (HYDRODIURIL ) 25 MG tablet Take 1 tablet (25 mg total) by mouth daily.   ondansetron  (ZOFRAN -ODT) 4 MG disintegrating tablet Take 1 tablet (4 mg total) by mouth every 8 (eight) hours as needed.   [DISCONTINUED] promethazine  (PHENERGAN ) 25 MG tablet Take 1 tablet (25 mg total) by mouth every 8 (eight) hours as needed for nausea or vomiting.   No facility-administered encounter medications on file as of 07/24/2024.    Past Medical History:  Diagnosis Date   Alcohol dependence (HCC)     Allergy    SEASONAL   Anemia    Arthritis    Heart murmur    Hypertension    Prediabetes    Unexplained weight loss 02/06/2021    Past Surgical History:  Procedure Laterality Date   NO PAST SURGERIES      Family History  Problem Relation Age of Onset   Diabetes Mother    Breast cancer Sister    Diabetes Sister    Heart murmur Sister    Diabetes Brother    Colon cancer Neg Hx    Colon polyps Neg Hx    Esophageal cancer Neg Hx    Rectal cancer Neg Hx    Stomach cancer Neg Hx    Ovarian cancer Neg Hx     Social History   Socioeconomic History   Marital status: Single    Spouse name: Not on file   Number of children: Not on file   Years of education: Not on file   Highest education level: Not on file  Occupational History   Occupation: Oceanographer  Tobacco Use   Smoking status: Every Day    Current packs/day: 0.50    Average packs/day: 0.5 packs/day for 38.0 years (19.0 ttl pk-yrs)    Types: Cigarettes    Passive exposure: Current   Smokeless tobacco: Never  Vaping Use   Vaping status: Never Used  Substance and Sexual Activity   Alcohol  use: Not Currently   Drug use: No   Sexual activity: Yes  Other Topics Concern   Not on file  Social History Narrative   Not on file   Social Drivers of Health   Financial Resource Strain: Not on file  Food Insecurity: No Food Insecurity (01/30/2023)   Hunger Vital Sign    Worried About Running Out of Food in the Last Year: Never true    Ran Out of Food in the Last Year: Never true  Transportation Needs: Not on file  Physical Activity: Not on file  Stress: Not on file  Social Connections: Not on file  Intimate Partner Violence: Not on file    Review of Systems  All other systems reviewed and are negative.       Objective    BP (!) 163/99   Pulse 94   Ht 6' 2 (1.88 m)   Wt 167 lb 6.4 oz (75.9 kg)   SpO2 95%   BMI 21.49 kg/m   Physical Exam Vitals and nursing note reviewed.  Constitutional:       General: He is not in acute distress. Cardiovascular:     Rate and Rhythm: Normal rate and regular rhythm.  Pulmonary:     Effort: Pulmonary effort is normal.     Breath sounds: Normal breath sounds.  Abdominal:     Palpations: Abdomen is soft.     Tenderness: There is no abdominal tenderness.  Musculoskeletal:     Right lower leg: No edema.     Left lower leg: No edema.  Neurological:     General: No focal deficit present.     Mental Status: He is alert and oriented to person, place, and time.         Assessment & Plan:   Uncontrolled hypertension -     amLODIPine  Besylate; Take 1 tablet (10 mg total) by mouth daily.  Dispense: 30 tablet; Refill: 0  Hypokalemia -     Basic metabolic panel with GFR  Hyponatremia  Other orders -     hydroCHLOROthiazide ; Take 1 tablet (25 mg total) by mouth daily.  Dispense: 90 tablet; Refill: 0 -     Metoprolol  Succinate ER; Take 1 tablet (50 mg total) by mouth daily. Take with or immediately following a meal.  Dispense: 90 tablet; Refill: 3     Return in about 4 weeks (around 08/21/2024) for follow up, chronic med issues.   Cameron Raguel SQUIBB, MD

## 2024-07-25 LAB — BASIC METABOLIC PANEL WITH GFR
BUN/Creatinine Ratio: 20 (ref 9–20)
BUN: 21 mg/dL (ref 6–24)
CO2: 24 mmol/L (ref 20–29)
Calcium: 9.3 mg/dL (ref 8.7–10.2)
Chloride: 97 mmol/L (ref 96–106)
Creatinine, Ser: 1.04 mg/dL (ref 0.76–1.27)
Glucose: 83 mg/dL (ref 70–99)
Potassium: 3.4 mmol/L — ABNORMAL LOW (ref 3.5–5.2)
Sodium: 138 mmol/L (ref 134–144)
eGFR: 83 mL/min/1.73 (ref >59)

## 2024-07-26 ENCOUNTER — Ambulatory Visit: Payer: Self-pay | Admitting: Family Medicine

## 2024-08-22 ENCOUNTER — Encounter: Payer: Self-pay | Admitting: Family Medicine

## 2024-08-22 ENCOUNTER — Ambulatory Visit: Admitting: Family Medicine

## 2024-08-22 VITALS — BP 169/110 | HR 89 | Ht 74.0 in | Wt 168.4 lb

## 2024-08-22 DIAGNOSIS — I10 Essential (primary) hypertension: Secondary | ICD-10-CM

## 2024-08-22 DIAGNOSIS — F10230 Alcohol dependence with withdrawal, uncomplicated: Secondary | ICD-10-CM | POA: Diagnosis not present

## 2024-08-22 DIAGNOSIS — R319 Hematuria, unspecified: Secondary | ICD-10-CM

## 2024-08-22 DIAGNOSIS — F17210 Nicotine dependence, cigarettes, uncomplicated: Secondary | ICD-10-CM

## 2024-08-22 DIAGNOSIS — Z23 Encounter for immunization: Secondary | ICD-10-CM | POA: Diagnosis not present

## 2024-08-22 DIAGNOSIS — F17200 Nicotine dependence, unspecified, uncomplicated: Secondary | ICD-10-CM

## 2024-08-22 MED ORDER — METOPROLOL SUCCINATE ER 100 MG PO TB24: 100.0000 mg | ORAL_TABLET | Freq: Every day | ORAL | 0 refills | Status: AC

## 2024-08-26 ENCOUNTER — Encounter: Payer: Self-pay | Admitting: Family Medicine

## 2024-08-26 NOTE — Progress Notes (Signed)
 Established Patient Office Visit  Subjective    Patient ID: Cameron Rogers, male    DOB: [birth year]  Age: 58 y.o. MRN: [id number]  CC:  Chief Complaint  Patient presents with   Medical Management of Chronic Issues    Pt reports he has been out of work for lack of balance in feet, diarrhea, and urine issues. He would like a note for work letting him go back to work.     HPI Cameron Rogers presents for routine follow up of hypertension. Patient reports that he is having medical issues such as intermittent diarrhea and intermittent balance issues but  that hsi job will not let him return to work without an excuse. He reports that his issues do not interfere with his working requirements.   Outpatient Encounter Medications as of 08/22/2024  Medication Sig   amLODipine  (NORVASC ) 10 MG tablet Take 1 tablet (10 mg total) by mouth daily.   amoxicillin -clavulanate (AUGMENTIN ) 875-125 MG tablet Take 1 tablet by mouth every 12 (twelve) hours.   chlordiazePOXIDE  (LIBRIUM ) 25 MG capsule 50mg  PO TID x 1D, then 25-50mg  PO BID X 1D, then 25-50mg  PO QD X 1D   hydrochlorothiazide  (HYDRODIURIL ) 25 MG tablet Take 1 tablet (25 mg total) by mouth daily.   metoprolol  succinate (TOPROL -XL) 100 MG 24 hr tablet Take 1 tablet (100 mg total) by mouth daily. Take with or immediately following a meal.   metoprolol  succinate (TOPROL -XL) 50 MG 24 hr tablet Take 1 tablet (50 mg total) by mouth daily. Take with or immediately following a meal.   ondansetron  (ZOFRAN -ODT) 4 MG disintegrating tablet Take 1 tablet (4 mg total) by mouth every 8 (eight) hours as needed.   potassium chloride  SA (KLOR-CON  M) 20 MEQ tablet Take 1 tablet (20 mEq total) by mouth 2 (two) times daily.   [DISCONTINUED] metoprolol  succinate (TOPROL -XL) 25 MG 24 hr tablet Take 1 tablet (25 mg total) by mouth daily.   [DISCONTINUED] promethazine  (PHENERGAN ) 25 MG tablet Take 1 tablet (25 mg total) by mouth every 8 (eight) hours as needed for nausea or  vomiting.   No facility-administered encounter medications on file as of 08/22/2024.    Past Medical History:  Diagnosis Date   Alcohol dependence (HCC)    Allergy    SEASONAL   Anemia    Arthritis    Heart murmur    Hypertension    Prediabetes    Unexplained weight loss 02/06/2021    Past Surgical History:  Procedure Laterality Date   NO PAST SURGERIES      Family History  Problem Relation Age of Onset   Diabetes Mother    Breast cancer Sister    Diabetes Sister    Heart murmur Sister    Diabetes Brother    Colon cancer Neg Hx    Colon polyps Neg Hx    Esophageal cancer Neg Hx    Rectal cancer Neg Hx    Stomach cancer Neg Hx    Ovarian cancer Neg Hx     Social History   Socioeconomic History   Marital status: Single    Spouse name: Not on file   Number of children: Not on file   Years of education: Not on file   Highest education level: Not on file  Occupational History   Occupation: oceanographer  Tobacco Use   Smoking status: Every Day    Current packs/day: 0.50    Average packs/day: 0.5 packs/day for 38.0 years (19.0 ttl pk-yrs)  Types: Cigarettes    Passive exposure: Current   Smokeless tobacco: Never  Vaping Use   Vaping status: Never Used  Substance and Sexual Activity   Alcohol use: Not Currently   Drug use: No   Sexual activity: Yes  Other Topics Concern   Not on file  Social History Narrative   Not on file   Social Drivers of Health   Financial Resource Strain: High Risk (08/22/2024)   Overall Financial Resource Strain (CARDIA)    Difficulty of Paying Living Expenses: Very hard  Food Insecurity: No Food Insecurity (01/30/2023)   Hunger Vital Sign    Worried About Running Out of Food in the Last Year: Never true    Ran Out of Food in the Last Year: Never true  Transportation Needs: No Transportation Needs (08/22/2024)   PRAPARE - Administrator, Civil Service (Medical): No    Lack of Transportation (Non-Medical): No   Physical Activity: Sufficiently Active (08/22/2024)   Exercise Vital Sign    Days of Exercise per Week: 7 days    Minutes of Exercise per Session: 60 min  Stress: No Stress Concern Present (08/22/2024)   [employer] of Occupational Health - Occupational Stress Questionnaire    Feeling of Stress: Not at all  Social Connections: Moderately Isolated (08/22/2024)   Social Connection and Isolation Panel    Frequency of Communication with Friends and Family: More than three times a week    Frequency of Social Gatherings with Friends and Family: More than three times a week    Attends Religious Services: 1 to 4 times per year    Active Member of [employer] or Organizations: No    Attends Banker Meetings: Never    Marital Status: Divorced  Catering Manager Violence: Not At Risk (08/22/2024)   Humiliation, Afraid, Rape, and Kick questionnaire    Fear of Current or Ex-Partner: No    Emotionally Abused: No    Physically Abused: No    Sexually Abused: No    Review of Systems  Musculoskeletal:  Negative for falls.  All other systems reviewed and are negative.       Objective    BP (!) 169/110   Pulse 89   Ht 6' 2 (1.88 m)   Wt 168 lb 6.4 oz (76.4 kg)   SpO2 99%   BMI 21.62 kg/m   Physical Exam Vitals and nursing note reviewed.  Constitutional:      General: He is not in acute distress. Cardiovascular:     Rate and Rhythm: Normal rate and regular rhythm.  Pulmonary:     Effort: Pulmonary effort is normal.     Breath sounds: Normal breath sounds.  Abdominal:     Palpations: Abdomen is soft.     Tenderness: There is no abdominal tenderness.  Neurological:     General: No focal deficit present.     Mental Status: He is alert and oriented to person, place, and time.         Assessment & Plan:  1. Uncontrolled hypertension (Primary) Elevated readings increase metoprolol  to 100mg  daily  2. Hematuria, unspecified type resolved  3. Alcohol dependence with  uncomplicated withdrawal (HCC) Patient reports decreased intake  4. Smoker Discussed reduction/cessation  5. Encounter for immunization  - Flu vaccine trivalent PF, 6mos and older(Flulaval,Afluria,Fluarix,Fluzone)  Return in about 4 weeks (around 09/19/2024) for follow up, chronic med issues.   Tanda Raguel SQUIBB, MD

## 2024-09-02 ENCOUNTER — Emergency Department (HOSPITAL_COMMUNITY)

## 2024-09-02 ENCOUNTER — Emergency Department (HOSPITAL_COMMUNITY)
Admission: EM | Admit: 2024-09-02 | Discharge: 2024-09-02 | Disposition: A | Discharge status: Home / Self Care | Attending: Emergency Medicine | Admitting: Emergency Medicine

## 2024-09-02 ENCOUNTER — Other Ambulatory Visit: Payer: Self-pay

## 2024-09-02 DIAGNOSIS — Y907 Blood alcohol level of 200-239 mg/100 ml: Secondary | ICD-10-CM | POA: Diagnosis not present

## 2024-09-02 DIAGNOSIS — K625 Hemorrhage of anus and rectum: Secondary | ICD-10-CM | POA: Insufficient documentation

## 2024-09-02 DIAGNOSIS — F10920 Alcohol use, unspecified with intoxication, uncomplicated: Secondary | ICD-10-CM

## 2024-09-02 DIAGNOSIS — F10220 Alcohol dependence with intoxication, uncomplicated: Secondary | ICD-10-CM | POA: Diagnosis present

## 2024-09-02 LAB — CBC
HCT: 41.3 % (ref 39.0–52.0)
Hemoglobin: 15 g/dL (ref 13.0–17.0)
MCH: 32.7 pg (ref 26.0–34.0)
MCHC: 36.3 g/dL — ABNORMAL HIGH (ref 30.0–36.0)
MCV: 90 fL (ref 80.0–100.0)
Platelets: 112 K/uL — ABNORMAL LOW (ref 150–400)
RBC: 4.59 MIL/uL (ref 4.22–5.81)
RDW: 15.3 % (ref 11.5–15.5)
WBC: 8.5 K/uL (ref 4.0–10.5)
nRBC: 0 % (ref 0.0–0.2)

## 2024-09-02 LAB — COMPREHENSIVE METABOLIC PANEL WITH GFR
ALT: 111 U/L — ABNORMAL HIGH (ref 0–44)
AST: 145 U/L — ABNORMAL HIGH (ref 15–41)
Albumin: 4 g/dL (ref 3.5–5.0)
Alkaline Phosphatase: 187 U/L — ABNORMAL HIGH (ref 38–126)
Anion gap: 15 (ref 5–15)
BUN: 9 mg/dL (ref 6–20)
CO2: 22 mmol/L (ref 22–32)
Calcium: 9.3 mg/dL (ref 8.9–10.3)
Chloride: 94 mmol/L — ABNORMAL LOW (ref 98–111)
Creatinine, Ser: 0.88 mg/dL (ref 0.61–1.24)
GFR, Estimated: >60 mL/min (ref >60)
Glucose, Bld: 100 mg/dL — ABNORMAL HIGH (ref 70–99)
Potassium: 3.6 mmol/L (ref 3.5–5.1)
Sodium: 131 mmol/L — ABNORMAL LOW (ref 135–145)
Total Bilirubin: 1.5 mg/dL — ABNORMAL HIGH (ref 0.0–1.2)
Total Protein: 7.3 g/dL (ref 6.5–8.1)

## 2024-09-02 LAB — URINALYSIS, ROUTINE W REFLEX MICROSCOPIC
Bacteria, UA: NONE SEEN
Bilirubin Urine: NEGATIVE
Glucose, UA: >=500 mg/dL — AB
Hgb urine dipstick: NEGATIVE
Ketones, ur: NEGATIVE mg/dL
Leukocytes,Ua: NEGATIVE
Nitrite: NEGATIVE
Protein, ur: NEGATIVE mg/dL
Specific Gravity, Urine: 1.016 (ref 1.005–1.030)
pH: 5 (ref 5.0–8.0)

## 2024-09-02 LAB — ETHANOL: Alcohol, Ethyl (B): 221 mg/dL — ABNORMAL HIGH (ref <15)

## 2024-09-02 LAB — POC OCCULT BLOOD, ED: Fecal Occult Bld: NEGATIVE

## 2024-09-02 LAB — TYPE AND SCREEN
ABO/RH(D): O POS
Antibody Screen: NEGATIVE

## 2024-09-02 MED ORDER — THIAMINE HCL 100 MG/ML IJ SOLN
100.0000 mg | Freq: Once | INTRAMUSCULAR | Status: AC
  Administered 2024-09-02: 100 mg via INTRAVENOUS
  Filled 2024-09-02: qty 2

## 2024-09-02 MED ORDER — CHLORDIAZEPOXIDE HCL 25 MG PO CAPS: ORAL_CAPSULE | ORAL | 0 refills | Status: AC

## 2024-09-02 MED ORDER — LACTATED RINGERS IV BOLUS
1000.0000 mL | Freq: Once | INTRAVENOUS | Status: AC
  Administered 2024-09-02: 1000 mL via INTRAVENOUS

## 2024-09-02 MED ORDER — LORAZEPAM 2 MG/ML IJ SOLN
1.0000 mg | Freq: Once | INTRAMUSCULAR | Status: AC
  Administered 2024-09-02: 1 mg via INTRAVENOUS
  Filled 2024-09-02: qty 1

## 2024-09-02 MED ORDER — IOHEXOL 300 MG/ML  SOLN
100.0000 mL | Freq: Once | INTRAMUSCULAR | Status: AC | PRN
  Administered 2024-09-02: 100 mL via INTRAVENOUS

## 2024-09-02 MED ORDER — FOLIC ACID 1 MG PO TABS
1.0000 mg | ORAL_TABLET | Freq: Once | ORAL | Status: AC
  Administered 2024-09-02: 1 mg via ORAL
  Filled 2024-09-02: qty 1

## 2024-09-02 MED ORDER — ADULT MULTIVITAMIN W/MINERALS CH
1.0000 | ORAL_TABLET | Freq: Once | ORAL | Status: AC
  Administered 2024-09-02: 1 via ORAL
  Filled 2024-09-02: qty 1

## 2024-09-02 NOTE — ED Triage Notes (Signed)
 Patient c/o hematuria and rectal bleeding x 2 week. Patient report dark tarry stool x 3 this week. Patient denies abdominal pain, denies Chest pain and SOB. Patient denies N/V.

## 2024-09-02 NOTE — ED Provider Triage Note (Signed)
 Emergency Medicine Provider Triage Evaluation Note  Cameron Rogers , a 58 y.o. male  was evaluated in triage.  Pt complains of hematuria x 2 weeks specifically in the morning. Denies hx of kidney stones and is not currently in pain. Also appreciates 3-4 weeks of bright red blood in stool and dark tarry stool. Denies current abdominal pain. Denies nausea or vomiting. Does appreciate a history of alcohol use but states he is cutting back. Last drink was today, drinks daily. Has PCP but never seen GI and has never had a colonoscopy. Denies dysuria and denies concern for STIs.   Review of Systems  Positive: Blood in urine, blood in stool Negative: Fever, chills, chest pain, SOB, no blood thinning medicine, abdominal pain, nausea, vomiting  Physical Exam  BP (!) 152/93 (BP Location: Left Arm)   Pulse 92   Temp 98.1 F (36.7 C) (Oral)   Resp 16   Ht 6' 2 (1.88 m)   Wt 76.5 kg   SpO2 100%   BMI 21.65 kg/m  Gen:   Awake, no distress   Resp:  Normal effort  MSK:   Moves extremities without difficulty  Other:    Medical Decision Making  Medically screening exam initiated at 3:41 PM.  Appropriate orders placed.  Cameron Rogers was informed that the remainder of the evaluation will be completed by another provider, this initial triage assessment does not replace that evaluation, and the importance of remaining in the ED until their evaluation is complete.  Orders: CBC, CMP, UA, POC occult blood, type and screen, ethanol   Janetta Terrall FALCON, [place] 09/02/24 1549

## 2024-09-02 NOTE — Discharge Instructions (Addendum)
 Follow-up with the GI doctor for further evaluation.  Try to cut down on your alcohol consumption.  The Librium  is a medication you can take to help prevent withdrawal.  Do not combine it while drinking alcohol

## 2024-09-02 NOTE — ED Provider Notes (Signed)
 [facility] AT [facility] Provider Note   CSN: [id number] Arrival date & time: 09/02/24  1426     Patient presents with: Hematuria and Rectal Bleeding   Cameron Rogers is a 58 y.o. male.    Hematuria  Rectal Bleeding    Patient has a history of hypertension allergies anemia alcohol dependence.  Patient states he started having trouble with blood in his stool the last couple weeks.  He has also noticed some blood in his urine for the last couple weeks.  Patient states the stool looked dark today.  He is not having nausea vomiting.  No chest pain or shortness of breath.  Patient does admit to regular alcohol use.  He drinks a 40 daily.  Patient has never seen a GI doctor for colonoscopy in the past.  He has not had any hematemesis.  Prior to Admission medications   Medication Sig Start Date End Date Taking? Authorizing Provider  chlordiazePOXIDE  (LIBRIUM ) 25 MG capsule 50mg  PO TID x 1D, then 25-50mg  PO BID X 1D, then 25-50mg  PO QD X 1D 09/02/24  Yes Randol Simmonds, MD  amLODipine  (NORVASC ) 10 MG tablet Take 1 tablet (10 mg total) by mouth daily. 07/24/24   Tanda Bleacher, MD  amoxicillin -clavulanate (AUGMENTIN ) 875-125 MG tablet Take 1 tablet by mouth every 12 (twelve) hours. 06/21/24   Ula Prentice SAUNDERS, MD  hydrochlorothiazide  (HYDRODIURIL ) 25 MG tablet Take 1 tablet (25 mg total) by mouth daily. 07/24/24   Tanda Bleacher, MD  metoprolol  succinate (TOPROL -XL) 100 MG 24 hr tablet Take 1 tablet (100 mg total) by mouth daily. Take with or immediately following a meal. 08/22/24   Tanda Bleacher, MD  metoprolol  succinate (TOPROL -XL) 50 MG 24 hr tablet Take 1 tablet (50 mg total) by mouth daily. Take with or immediately following a meal. 07/24/24   Tanda Bleacher, MD  ondansetron  (ZOFRAN -ODT) 4 MG disintegrating tablet Take 1 tablet (4 mg total) by mouth every 8 (eight) hours as needed. 05/22/24   Long, Joshua G, MD  potassium chloride  SA (KLOR-CON  M) 20 MEQ tablet Take 1  tablet (20 mEq total) by mouth 2 (two) times daily. 06/21/24   Ula Prentice SAUNDERS, MD  promethazine  (PHENERGAN ) 25 MG tablet Take 1 tablet (25 mg total) by mouth every 8 (eight) hours as needed for nausea or vomiting. 04/25/17 04/02/19  Patsey Lot, MD    Allergies: Doxycycline  and Lisinopril     Review of Systems  Gastrointestinal:  Positive for hematochezia.  Genitourinary:  Positive for hematuria.    Updated Vital Signs BP (!) 148/90   Pulse 94   Temp 98.6 F (37 C) (Axillary)   Resp 18   Ht 1.88 m (6' 2)   Wt 76.5 kg   SpO2 98%   BMI 21.65 kg/m   Physical Exam Vitals and nursing note reviewed.  Constitutional:      Appearance: He is well-developed. He is not diaphoretic.  HENT:     Head: Normocephalic and atraumatic.     Right Ear: External ear normal.     Left Ear: External ear normal.  Eyes:     General: No scleral icterus.       Right eye: No discharge.        Left eye: No discharge.     Conjunctiva/sclera: Conjunctivae normal.  Neck:     Trachea: No tracheal deviation.  Cardiovascular:     Rate and Rhythm: Normal rate and regular rhythm.  Pulmonary:     Effort: Pulmonary  effort is normal. No respiratory distress.     Breath sounds: Normal breath sounds. No stridor. No wheezing or rales.  Abdominal:     General: Bowel sounds are normal. There is no distension.     Palpations: Abdomen is soft.     Tenderness: There is generalized abdominal tenderness. There is no guarding or rebound.  Genitourinary:    Comments: No gross blood noted on rectal exam Musculoskeletal:        General: No tenderness or deformity.     Cervical back: Neck supple.  Skin:    General: Skin is warm and dry.     Findings: No rash.  Neurological:     General: No focal deficit present.     Mental Status: He is alert.     Cranial Nerves: No cranial nerve deficit, dysarthria or facial asymmetry.     Sensory: No sensory deficit.     Motor: Tremor present. No abnormal muscle tone or  seizure activity.     Coordination: Coordination normal.  Psychiatric:        Mood and Affect: Mood normal.     (all labs ordered are listed, but only abnormal results are displayed) Labs Reviewed  COMPREHENSIVE METABOLIC PANEL WITH GFR - Abnormal; Notable for the following components:      Result Value   Sodium 131 (*)    Chloride 94 (*)    Glucose, Bld 100 (*)    AST 145 (*)    ALT 111 (*)    Alkaline Phosphatase 187 (*)    Total Bilirubin 1.5 (*)    All other components within normal limits  CBC - Abnormal; Notable for the following components:   MCHC 36.3 (*)    Platelets 112 (*)    All other components within normal limits  URINALYSIS, ROUTINE W REFLEX MICROSCOPIC - Abnormal; Notable for the following components:   Color, Urine AMBER (*)    Glucose, UA >=500 (*)    All other components within normal limits  ETHANOL - Abnormal; Notable for the following components:   Alcohol, Ethyl (B) 221 (*)    All other components within normal limits  POC OCCULT BLOOD, ED  TYPE AND SCREEN    EKG: None  Radiology: CT ABDOMEN PELVIS W CONTRAST Result Date: 09/02/2024 EXAM: CT ABDOMEN AND PELVIS WITH CONTRAST 09/02/2024 09:26:37 PM TECHNIQUE: CT of the abdomen and pelvis was performed with the administration of 100 mL of iohexol  (OMNIPAQUE ) 300 MG/ML solution. Multiplanar reformatted images are provided for review. Automated exposure control, iterative reconstruction, and/or weight-based adjustment of the mA/kV was utilized to reduce the radiation dose to as low as reasonably achievable. COMPARISON: None available. CLINICAL HISTORY: Abdominal pain, acute, nonlocalized; Diverticulitis, complication suspected. FINDINGS: LOWER CHEST: No acute abnormality. LIVER: Moderate hepatic steatosis. No intrahepatic mass. No intrahepatic biliary ductal dilation. GALLBLADDER AND BILE DUCTS: Gallbladder is unremarkable. No biliary ductal dilatation. SPLEEN: No acute abnormality. PANCREAS: No acute  abnormality. ADRENAL GLANDS: No acute abnormality. KIDNEYS, URETERS AND BLADDER: No stones in the kidneys or ureters. No hydronephrosis. No perinephric or periureteral stranding. Urinary bladder is unremarkable. GI AND BOWEL: Stomach demonstrates no acute abnormality. Mild scattered pancolonic diverticulosis without superimposed acute inflammatory change. The small bowel and large bowel are otherwise unremarkable. Appendix normal. There is no bowel obstruction. PERITONEUM AND RETROPERITONEUM: No ascites. No free air. VASCULATURE: Aorta is normal in caliber. Minimal aortoiliac atherosclerotic calcification. No aortic aneurysm. LYMPH NODES: No lymphadenopathy. REPRODUCTIVE ORGANS: Marked prostatic hypertrophy. BONES AND SOFT TISSUES: Osseous  structures are age appropriate. No acute bone abnormality. No lytic or blastic bone lesion. No focal soft tissue abnormality. IMPRESSION: 1. No CT evidence of diverticulitis or other acute abnormality 2. Moderate hepatic steatosis 3. Mild scattered colonic diverticulosis without acute inflammatory change 4. Marked prostatic hypertrophy Electronically signed by: Dorethia Molt MD 09/02/2024 10:23 PM EST RP Workstation: [id number]     Procedures   Medications Ordered in the ED  LORazepam  (ATIVAN ) injection 1 mg (1 mg Intravenous Given 09/02/24 2048)  thiamine  (VITAMIN B1) injection 100 mg (100 mg Intravenous Given 09/02/24 2050)  folic acid  (FOLVITE ) tablet 1 mg (1 mg Oral Given 09/02/24 2050)  multivitamin with minerals tablet 1 tablet (1 tablet Oral Given 09/02/24 2050)  lactated ringers  bolus 1,000 mL (1,000 mLs Intravenous New Bag/Given 09/02/24 2046)  iohexol  (OMNIPAQUE ) 300 MG/ML solution 100 mL (100 mLs Intravenous Contrast Given 09/02/24 2111)    Clinical Course as of 09/02/24 2301  Mon Sep 02, 2024  1957 Comprehensive metabolic panel(!) Hyponatremia noted although improved compared to months ago.  CBC normal with the exception of platelets decreased at 112.   Urinalysis normal. [JK]  2017 Alcohol level elevated at 221 [JK]  2041 POC occult blood, ED nl [JK]  2239 CT scan does not show diverticulitis.  Moderate hepatic steatosis noted. [JK]    Clinical Course User Index [JK] Randol Simmonds, MD                                 Medical Decision Making Problems Addressed: Alcoholic intoxication without complication: acute illness or injury that poses a threat to life or bodily functions Rectal bleeding: acute illness or injury that poses a threat to life or bodily functions  Amount and/or Complexity of Data Reviewed Labs: ordered. Decision-making details documented in ED Course. Radiology: ordered and independent interpretation performed.  Risk Prescription drug management.   Patient presented to the ED for evaluation of complaints of blood in his urine in his stool.  Considered the possibility of diverticulitis colitis other serious causes of GI bleeding.  Patient also admits to chronic alcohol use.  He was not having any hematemesis.  No known history of varices.  ED workup reassuring.  He is not anemic.  His urinalysis does not suggest hematuria.  His fecal occult was negative  Patient's alcohol level was elevated 221.  I discussed his alcohol consumption and the dangers associated that with him.  Patient was monitored in the ED.  No signs of withdrawal.  Will discharge home recommend cessation of alcohol.  Will give prescription for Librium .  Recommend outpatient follow-up with GI.     Final diagnoses:  Alcoholic intoxication without complication  Rectal bleeding    ED Discharge Orders          Ordered    chlordiazePOXIDE  (LIBRIUM ) 25 MG capsule        09/02/24 2252               Randol Simmonds, MD 09/02/24 2302

## 2024-09-17 ENCOUNTER — Emergency Department (HOSPITAL_COMMUNITY): Admission: EM | Admit: 2024-09-17 | Discharge: 2024-09-17 | Disposition: A | Discharge status: Home / Self Care

## 2024-09-17 ENCOUNTER — Encounter (HOSPITAL_COMMUNITY): Payer: Self-pay | Admitting: Emergency Medicine

## 2024-09-17 DIAGNOSIS — E871 Hypo-osmolality and hyponatremia: Secondary | ICD-10-CM | POA: Diagnosis not present

## 2024-09-17 DIAGNOSIS — E876 Hypokalemia: Secondary | ICD-10-CM | POA: Insufficient documentation

## 2024-09-17 DIAGNOSIS — E860 Dehydration: Secondary | ICD-10-CM | POA: Diagnosis not present

## 2024-09-17 DIAGNOSIS — M79641 Pain in right hand: Secondary | ICD-10-CM | POA: Insufficient documentation

## 2024-09-17 DIAGNOSIS — M79642 Pain in left hand: Secondary | ICD-10-CM | POA: Insufficient documentation

## 2024-09-17 LAB — CBC WITH DIFFERENTIAL/PLATELET
Abs Immature Granulocytes: 0.03 K/uL (ref 0.00–0.07)
Basophils Absolute: 0 K/uL (ref 0.0–0.1)
Basophils Relative: 1 %
Eosinophils Absolute: 0 K/uL (ref 0.0–0.5)
Eosinophils Relative: 0 %
HCT: 36.3 % — ABNORMAL LOW (ref 39.0–52.0)
Hemoglobin: 13.4 g/dL (ref 13.0–17.0)
Immature Granulocytes: 1 %
Lymphocytes Relative: 24 %
Lymphs Abs: 1.3 K/uL (ref 0.7–4.0)
MCH: 32.4 pg (ref 26.0–34.0)
MCHC: 36.9 g/dL — ABNORMAL HIGH (ref 30.0–36.0)
MCV: 87.9 fL (ref 80.0–100.0)
Monocytes Absolute: 0.3 K/uL (ref 0.1–1.0)
Monocytes Relative: 6 %
Neutro Abs: 3.7 K/uL (ref 1.7–7.7)
Neutrophils Relative %: 68 %
Platelets: 94 K/uL — ABNORMAL LOW (ref 150–400)
RBC: 4.13 MIL/uL — ABNORMAL LOW (ref 4.22–5.81)
RDW: 15.4 % (ref 11.5–15.5)
Smear Review: NORMAL
WBC: 5.4 K/uL (ref 4.0–10.5)
nRBC: 0 % (ref 0.0–0.2)

## 2024-09-17 LAB — I-STAT CHEM 8, ED
BUN: <3 mg/dL — ABNORMAL LOW (ref 6–20)
BUN: <3 mg/dL — ABNORMAL LOW (ref 6–20)
Calcium, Ion: 1.12 mmol/L — ABNORMAL LOW (ref 1.15–1.40)
Calcium, Ion: 1.1 mmol/L — ABNORMAL LOW (ref 1.15–1.40)
Chloride: 95 mmol/L — ABNORMAL LOW (ref 98–111)
Chloride: 95 mmol/L — ABNORMAL LOW (ref 98–111)
Creatinine, Ser: 1.1 mg/dL (ref 0.61–1.24)
Creatinine, Ser: 1 mg/dL (ref 0.61–1.24)
Glucose, Bld: 99 mg/dL (ref 70–99)
Glucose, Bld: 135 mg/dL — ABNORMAL HIGH (ref 70–99)
HCT: 44 % (ref 39.0–52.0)
HCT: 41 % (ref 39.0–52.0)
Hemoglobin: 15 g/dL (ref 13.0–17.0)
Hemoglobin: 13.9 g/dL (ref 13.0–17.0)
Potassium: 3.1 mmol/L — ABNORMAL LOW (ref 3.5–5.1)
Potassium: 2.9 mmol/L — ABNORMAL LOW (ref 3.5–5.1)
Sodium: 130 mmol/L — ABNORMAL LOW (ref 135–145)
Sodium: 130 mmol/L — ABNORMAL LOW (ref 135–145)
TCO2: 21 mmol/L — ABNORMAL LOW (ref 22–32)
TCO2: 22 mmol/L (ref 22–32)

## 2024-09-17 LAB — BASIC METABOLIC PANEL WITH GFR
Anion gap: 14 (ref 5–15)
BUN: <5 mg/dL — ABNORMAL LOW (ref 6–20)
CO2: 22 mmol/L (ref 22–32)
Calcium: 9.2 mg/dL (ref 8.9–10.3)
Chloride: 93 mmol/L — ABNORMAL LOW (ref 98–111)
Creatinine, Ser: 0.98 mg/dL (ref 0.61–1.24)
GFR, Estimated: >60 mL/min (ref >60)
Glucose, Bld: 103 mg/dL — ABNORMAL HIGH (ref 70–99)
Potassium: 3.2 mmol/L — ABNORMAL LOW (ref 3.5–5.1)
Sodium: 129 mmol/L — ABNORMAL LOW (ref 135–145)

## 2024-09-17 LAB — CBG MONITORING, ED: Glucose-Capillary: 182 mg/dL — ABNORMAL HIGH (ref 70–99)

## 2024-09-17 LAB — I-STAT CG4 LACTIC ACID, ED
Lactic Acid, Venous: 3.2 mmol/L (ref 0.5–1.9)
Lactic Acid, Venous: 2.9 mmol/L (ref 0.5–1.9)
Lactic Acid, Venous: 4.8 mmol/L (ref 0.5–1.9)

## 2024-09-17 LAB — MAGNESIUM: Magnesium: 1.7 mg/dL (ref 1.7–2.4)

## 2024-09-17 MED ORDER — SODIUM CHLORIDE 0.9 % IV BOLUS
1000.0000 mL | Freq: Once | INTRAVENOUS | Status: AC
  Administered 2024-09-17: 1000 mL via INTRAVENOUS

## 2024-09-17 MED ORDER — POTASSIUM CHLORIDE CRYS ER 20 MEQ PO TBCR
40.0000 meq | EXTENDED_RELEASE_TABLET | Freq: Once | ORAL | Status: AC
  Administered 2024-09-17: 40 meq via ORAL
  Filled 2024-09-17: qty 2

## 2024-09-17 MED ORDER — LACTATED RINGERS IV BOLUS
1000.0000 mL | Freq: Once | INTRAVENOUS | Status: AC
  Administered 2024-09-17: 1000 mL via INTRAVENOUS

## 2024-09-17 MED ORDER — POTASSIUM CHLORIDE CRYS ER 20 MEQ PO TBCR: 40.0000 meq | EXTENDED_RELEASE_TABLET | Freq: Every day | ORAL | 0 refills | Status: AC

## 2024-09-17 NOTE — ED Triage Notes (Signed)
 Pt arriving with bilateral hand pain/cramping. Hx of hypokalemia, is being followed by PCP for such.

## 2024-09-17 NOTE — Discharge Instructions (Addendum)
 Drink lots of fluids and eat a balanced diet.  Take your potassium as prescribed.  Follow with your primary care doctor in 1 to 2 weeks.  Call the office to make an appointment.  Return to the ER for new or worsening symptoms.

## 2024-09-17 NOTE — ED Provider Notes (Signed)
 [place] EMERGENCY DEPARTMENT AT [facility] Provider Note   CSN: [id number] Arrival date & time: 09/17/24  1009     Patient presents with: Hand Pain   Cameron Rogers is a 58 y.o. male.   58 year old male presents for evaluation of hand pain and cramping.  States he has a history of hyponatremia and hypokalemia.  States he has been taking his potassium supplements but is still having cramping and spasms.  States has been treated for this in the past.  Denies any other symptoms or concerns.   Hand Pain Pertinent negatives include no chest pain, no abdominal pain and no shortness of breath.       Prior to Admission medications   Medication Sig Start Date End Date Taking? Authorizing Provider  potassium chloride  SA (KLOR-CON  M) 20 MEQ tablet Take 2 tablets (40 mEq total) by mouth daily. 09/17/24  Yes Marilu Rylander L, DO  amLODipine  (NORVASC ) 10 MG tablet Take 1 tablet (10 mg total) by mouth daily. 07/24/24   Tanda Bleacher, MD  amoxicillin -clavulanate (AUGMENTIN ) 875-125 MG tablet Take 1 tablet by mouth every 12 (twelve) hours. 06/21/24   Ula Prentice SAUNDERS, MD  chlordiazePOXIDE  (LIBRIUM ) 25 MG capsule 50mg  PO TID x 1D, then 25-50mg  PO BID X 1D, then 25-50mg  PO QD X 1D 09/02/24   Randol Simmonds, MD  hydrochlorothiazide  (HYDRODIURIL ) 25 MG tablet Take 1 tablet (25 mg total) by mouth daily. 07/24/24   Tanda Bleacher, MD  metoprolol  succinate (TOPROL -XL) 100 MG 24 hr tablet Take 1 tablet (100 mg total) by mouth daily. Take with or immediately following a meal. 08/22/24   Tanda Bleacher, MD  metoprolol  succinate (TOPROL -XL) 50 MG 24 hr tablet Take 1 tablet (50 mg total) by mouth daily. Take with or immediately following a meal. 07/24/24   Tanda Bleacher, MD  ondansetron  (ZOFRAN -ODT) 4 MG disintegrating tablet Take 1 tablet (4 mg total) by mouth every 8 (eight) hours as needed. 05/22/24   Long, Joshua G, MD  potassium chloride  SA (KLOR-CON  M) 20 MEQ tablet Take 1 tablet (20 mEq total) by mouth  2 (two) times daily. 06/21/24   Ula Prentice SAUNDERS, MD  promethazine  (PHENERGAN ) 25 MG tablet Take 1 tablet (25 mg total) by mouth every 8 (eight) hours as needed for nausea or vomiting. 04/25/17 04/02/19  Patsey Lot, MD    Allergies: Doxycycline  and Lisinopril     Review of Systems  Constitutional:  Negative for chills and fever.  HENT:  Negative for ear pain and sore throat.   Eyes:  Negative for pain and visual disturbance.  Respiratory:  Negative for cough and shortness of breath.   Cardiovascular:  Negative for chest pain and palpitations.  Gastrointestinal:  Negative for abdominal pain and vomiting.  Genitourinary:  Negative for dysuria and hematuria.  Musculoskeletal:  Negative for arthralgias and back pain.       Notes cramping and hand pain bilaterally  Skin:  Negative for color change and rash.  Neurological:  Negative for seizures and syncope.  All other systems reviewed and are negative.   Updated Vital Signs BP 124/84 (BP Location: Right Arm)   Pulse 74   Temp 98.6 F (37 C) (Oral)   Resp 18   SpO2 100%   Physical Exam Vitals and nursing note reviewed.  Constitutional:      General: He is not in acute distress.    Appearance: He is well-developed.  HENT:     Head: Normocephalic and atraumatic.  Eyes:  Conjunctiva/sclera: Conjunctivae normal.  Cardiovascular:     Rate and Rhythm: Normal rate and regular rhythm.     Heart sounds: No murmur heard. Pulmonary:     Effort: Pulmonary effort is normal. No respiratory distress.     Breath sounds: Normal breath sounds.  Abdominal:     Palpations: Abdomen is soft.     Tenderness: There is no abdominal tenderness.  Musculoskeletal:        General: No swelling.     Cervical back: Neck supple.  Skin:    General: Skin is warm and dry.     Capillary Refill: Capillary refill takes less than 2 seconds.  Neurological:     General: No focal deficit present.     Mental Status: He is alert.  Psychiatric:        Mood  and Affect: Mood normal.     (all labs ordered are listed, but only abnormal results are displayed) Labs Reviewed  BASIC METABOLIC PANEL WITH GFR - Abnormal; Notable for the following components:      Result Value   Sodium 129 (*)    Potassium 3.2 (*)    Chloride 93 (*)    Glucose, Bld 103 (*)    BUN <5 (*)    All other components within normal limits  CBC WITH DIFFERENTIAL/PLATELET - Abnormal; Notable for the following components:   RBC 4.13 (*)    HCT 36.3 (*)    MCHC 36.9 (*)    Platelets 94 (*)    All other components within normal limits  I-STAT CHEM 8, ED - Abnormal; Notable for the following components:   Sodium 130 (*)    Potassium 3.1 (*)    Chloride 95 (*)    BUN <3 (*)    Calcium, Ion 1.12 (*)    TCO2 21 (*)    All other components within normal limits  CBG MONITORING, ED - Abnormal; Notable for the following components:   Glucose-Capillary 182 (*)    All other components within normal limits  I-STAT CG4 LACTIC ACID, ED - Abnormal; Notable for the following components:   Lactic Acid, Venous 3.2 (*)    All other components within normal limits  I-STAT CG4 LACTIC ACID, ED - Abnormal; Notable for the following components:   Lactic Acid, Venous 2.9 (*)    All other components within normal limits  I-STAT CHEM 8, ED - Abnormal; Notable for the following components:   Sodium 130 (*)    Potassium 2.9 (*)    Chloride 95 (*)    BUN <3 (*)    Glucose, Bld 135 (*)    Calcium, Ion 1.10 (*)    All other components within normal limits  I-STAT CG4 LACTIC ACID, ED - Abnormal; Notable for the following components:   Lactic Acid, Venous 4.8 (*)    All other components within normal limits  MAGNESIUM  I-STAT CG4 LACTIC ACID, ED  I-STAT CG4 LACTIC ACID, ED    EKG: EKG Interpretation Date/Time:  Tuesday September 17 2024 11:19:50 EST Ventricular Rate:  82 PR Interval:  172 QRS Duration:  122 QT Interval:  398 QTC Calculation: 464 R Axis:   -3  Text  Interpretation: Normal sinus rhythm Biatrial enlargement Non-specific intra-ventricular conduction delay Compared with prior EKG from 05/20/2024 Confirmed by Gennaro Bouchard ([id number]) on 09/17/2024 11:25:22 AM  Radiology: No results found.   Procedures   Medications Ordered in the ED  sodium chloride  0.9 % bolus 1,000 mL (0 mLs Intravenous Stopped  09/17/24 1230)  potassium chloride  SA (KLOR-CON  M) CR tablet 40 mEq (40 mEq Oral Given 09/17/24 1134)  lactated ringers  bolus 1,000 mL (0 mLs Intravenous Stopped 09/17/24 1358)                                    Medical Decision Making Patient here for cramping in his hands.  Found to have some hypokalemia and hyponatremia.  Was given 2 boluses of IV fluids.  He did have a climbing lactate, he has a history of alcohol abuse in the past, and does seem to have an elevated lactate at baseline when I review his labs.  He would like to go home and would not like to be admitted at this time.  Think this is reasonable.  Will give him a few doses of potassium as a prescription he has vies close up with primary care and otherwise return to the ER for new or worsening symptoms.  Problems Addressed: Dehydration: acute illness or injury Hypokalemia: acute illness or injury Hyponatremia: acute illness or injury Pain in both hands: acute illness or injury  Amount and/or Complexity of Data Reviewed External Data Reviewed: notes. Labs: ordered. Decision-making details documented in ED Course.    Details: Ordered and reviewed by me and patient has hyponatremia and hypokalemia ECG/medicine tests: ordered and independent interpretation performed. Decision-making details documented in ED Course.    Details: Ordered and interpreted by me in the absence of cardiology and shows sinus rhythm, no STEMI or significant changes when compared to prior  Risk OTC drugs. Prescription drug management. Drug therapy requiring intensive monitoring for toxicity.     Final  diagnoses:  Dehydration  Hypokalemia  Hyponatremia  Pain in both hands    ED Discharge Orders          Ordered    potassium chloride  SA (KLOR-CON  M) 20 MEQ tablet  Daily        09/17/24 1417               Gennaro Duwaine CROME, DO 09/17/24 1651

## 2024-10-23 ENCOUNTER — Ambulatory Visit: Admitting: Family Medicine

## 2024-11-25 ENCOUNTER — Ambulatory Visit: Admitting: Family Medicine
